# Patient Record
Sex: Female | Born: 1935 | Race: Black or African American | Hispanic: No | Marital: Single | Smoking: Current some day smoker
Health system: Southern US, Community
[De-identification: ages and names within clinical notes are randomized; demographics above are authoritative.]

## PROBLEM LIST (undated history)

## (undated) DIAGNOSIS — M1990 Unspecified osteoarthritis, unspecified site: Secondary | ICD-10-CM

## (undated) DIAGNOSIS — F17210 Nicotine dependence, cigarettes, uncomplicated: Secondary | ICD-10-CM

## (undated) DIAGNOSIS — C50919 Malignant neoplasm of unspecified site of unspecified female breast: Secondary | ICD-10-CM

## (undated) DIAGNOSIS — E7800 Pure hypercholesterolemia, unspecified: Secondary | ICD-10-CM

## (undated) DIAGNOSIS — I10 Essential (primary) hypertension: Secondary | ICD-10-CM

## (undated) DIAGNOSIS — E039 Hypothyroidism, unspecified: Secondary | ICD-10-CM

## (undated) HISTORY — DX: Pure hypercholesterolemia, unspecified: E78.00

## (undated) HISTORY — DX: Nicotine dependence, cigarettes, uncomplicated: F17.210

## (undated) HISTORY — DX: Malignant neoplasm of unspecified site of unspecified female breast: C50.919

## (undated) HISTORY — PX: ROTATOR CUFF REPAIR: SHX139

## (undated) HISTORY — DX: Essential (primary) hypertension: I10

## (undated) HISTORY — DX: Unspecified osteoarthritis, unspecified site: M19.90

## (undated) HISTORY — DX: Hypothyroidism, unspecified: E03.9

---

## 1970-10-26 HISTORY — PX: PARTIAL HYSTERECTOMY: SHX80

## 2000-09-13 ENCOUNTER — Encounter: Payer: Self-pay | Admitting: Internal Medicine

## 2000-09-13 ENCOUNTER — Encounter: Admission: RE | Admit: 2000-09-13 | Discharge: 2000-09-13 | Payer: Self-pay | Admitting: Internal Medicine

## 2007-10-27 HISTORY — PX: BREAST LUMPECTOMY: SHX2

## 2008-02-24 ENCOUNTER — Encounter: Admission: RE | Admit: 2008-02-24 | Discharge: 2008-02-24 | Payer: Self-pay | Admitting: Surgery

## 2008-02-28 ENCOUNTER — Ambulatory Visit (HOSPITAL_BASED_OUTPATIENT_CLINIC_OR_DEPARTMENT_OTHER): Admission: RE | Admit: 2008-02-28 | Discharge: 2008-02-28 | Payer: Self-pay | Admitting: Surgery

## 2008-02-28 ENCOUNTER — Encounter (INDEPENDENT_AMBULATORY_CARE_PROVIDER_SITE_OTHER): Payer: Self-pay | Admitting: Surgery

## 2008-03-07 ENCOUNTER — Ambulatory Visit: Payer: Self-pay | Admitting: Oncology

## 2008-03-14 ENCOUNTER — Ambulatory Visit: Admission: RE | Admit: 2008-03-14 | Discharge: 2008-05-25 | Payer: Self-pay | Admitting: Radiation Oncology

## 2008-05-02 ENCOUNTER — Ambulatory Visit: Payer: Self-pay | Admitting: Oncology

## 2008-05-07 LAB — CBC WITH DIFFERENTIAL/PLATELET
BASO%: 0.3 % (ref 0.0–2.0)
EOS%: 1.8 % (ref 0.0–7.0)
HGB: 14.9 g/dL (ref 11.6–15.9)
MCH: 31.6 pg (ref 26.0–34.0)
MCHC: 34.3 g/dL (ref 32.0–36.0)
RDW: 13.6 % (ref 11.3–14.5)
lymph#: 1.3 10*3/uL (ref 0.9–3.3)

## 2008-05-07 LAB — COMPREHENSIVE METABOLIC PANEL
ALT: 24 U/L (ref 0–35)
AST: 17 U/L (ref 0–37)
Albumin: 4.7 g/dL (ref 3.5–5.2)
Calcium: 10.6 mg/dL — ABNORMAL HIGH (ref 8.4–10.5)
Chloride: 102 mEq/L (ref 96–112)
Potassium: 3.7 mEq/L (ref 3.5–5.3)

## 2008-07-03 ENCOUNTER — Ambulatory Visit: Payer: Self-pay | Admitting: Oncology

## 2008-07-06 LAB — CBC WITH DIFFERENTIAL/PLATELET
Basophils Absolute: 0 10*3/uL (ref 0.0–0.1)
EOS%: 1.8 % (ref 0.0–7.0)
HCT: 44.1 % (ref 34.8–46.6)
HGB: 15.2 g/dL (ref 11.6–15.9)
LYMPH%: 27.3 % (ref 14.0–48.0)
MCH: 31.9 pg (ref 26.0–34.0)
MCV: 92.3 fL (ref 81.0–101.0)
MONO%: 7.5 % (ref 0.0–13.0)
NEUT%: 63.1 % (ref 39.6–76.8)

## 2008-07-06 LAB — COMPREHENSIVE METABOLIC PANEL
ALT: 20 U/L (ref 0–35)
AST: 17 U/L (ref 0–37)
Albumin: 4.8 g/dL (ref 3.5–5.2)
BUN: 11 mg/dL (ref 6–23)
CO2: 22 mEq/L (ref 19–32)
Calcium: 11.1 mg/dL — ABNORMAL HIGH (ref 8.4–10.5)
Chloride: 104 mEq/L (ref 96–112)
Potassium: 3.8 mEq/L (ref 3.5–5.3)

## 2008-09-05 ENCOUNTER — Ambulatory Visit: Payer: Self-pay | Admitting: Oncology

## 2008-09-07 LAB — COMPREHENSIVE METABOLIC PANEL
ALT: 28 U/L (ref 0–35)
AST: 21 U/L (ref 0–37)
Calcium: 10.5 mg/dL (ref 8.4–10.5)
Chloride: 107 mEq/L (ref 96–112)
Creatinine, Ser: 0.78 mg/dL (ref 0.40–1.20)
Sodium: 141 mEq/L (ref 135–145)
Total Bilirubin: 0.4 mg/dL (ref 0.3–1.2)
Total Protein: 7.8 g/dL (ref 6.0–8.3)

## 2008-09-07 LAB — CBC WITH DIFFERENTIAL/PLATELET
BASO%: 0.4 % (ref 0.0–2.0)
EOS%: 0.6 % (ref 0.0–7.0)
HCT: 42.9 % (ref 34.8–46.6)
MCH: 31.5 pg (ref 26.0–34.0)
MCHC: 33.8 g/dL (ref 32.0–36.0)
NEUT%: 65.5 % (ref 39.6–76.8)
RBC: 4.62 10*6/uL (ref 3.70–5.32)
lymph#: 2 10*3/uL (ref 0.9–3.3)

## 2008-12-05 ENCOUNTER — Ambulatory Visit: Payer: Self-pay | Admitting: Oncology

## 2008-12-21 ENCOUNTER — Encounter: Admission: RE | Admit: 2008-12-21 | Discharge: 2008-12-21 | Payer: Self-pay | Admitting: Oncology

## 2008-12-28 ENCOUNTER — Encounter: Admission: RE | Admit: 2008-12-28 | Discharge: 2008-12-28 | Payer: Self-pay | Admitting: Oncology

## 2008-12-28 ENCOUNTER — Encounter (INDEPENDENT_AMBULATORY_CARE_PROVIDER_SITE_OTHER): Payer: Self-pay | Admitting: Diagnostic Radiology

## 2009-02-06 ENCOUNTER — Encounter: Admission: RE | Admit: 2009-02-06 | Discharge: 2009-02-06 | Payer: Self-pay | Admitting: Orthopaedic Surgery

## 2009-03-06 ENCOUNTER — Ambulatory Visit: Payer: Self-pay | Admitting: Oncology

## 2009-03-08 LAB — COMPREHENSIVE METABOLIC PANEL
ALT: 29 U/L (ref 0–35)
AST: 22 U/L (ref 0–37)
Albumin: 4.3 g/dL (ref 3.5–5.2)
Calcium: 11 mg/dL — ABNORMAL HIGH (ref 8.4–10.5)
Chloride: 106 mEq/L (ref 96–112)
Potassium: 3.8 mEq/L (ref 3.5–5.3)
Sodium: 140 mEq/L (ref 135–145)
Total Protein: 7.5 g/dL (ref 6.0–8.3)

## 2009-03-08 LAB — CBC WITH DIFFERENTIAL/PLATELET
BASO%: 0.4 % (ref 0.0–2.0)
Eosinophils Absolute: 0 10*3/uL (ref 0.0–0.5)
MCHC: 34.8 g/dL (ref 31.5–36.0)
MONO#: 0.4 10*3/uL (ref 0.1–0.9)
NEUT#: 2.3 10*3/uL (ref 1.5–6.5)
RBC: 4.83 10*6/uL (ref 3.70–5.45)
WBC: 4.6 10*3/uL (ref 3.9–10.3)
lymph#: 1.9 10*3/uL (ref 0.9–3.3)
nRBC: 0 % (ref 0–0)

## 2009-03-08 LAB — LACTATE DEHYDROGENASE: LDH: 177 U/L (ref 94–250)

## 2009-05-21 ENCOUNTER — Inpatient Hospital Stay (HOSPITAL_COMMUNITY): Admission: RE | Admit: 2009-05-21 | Discharge: 2009-05-24 | Payer: Self-pay | Admitting: Orthopaedic Surgery

## 2009-07-23 ENCOUNTER — Ambulatory Visit: Payer: Self-pay | Admitting: Oncology

## 2009-07-25 LAB — CBC WITH DIFFERENTIAL/PLATELET
Basophils Absolute: 0 10*3/uL (ref 0.0–0.1)
Eosinophils Absolute: 0 10*3/uL (ref 0.0–0.5)
HCT: 40.9 % (ref 34.8–46.6)
HGB: 14.1 g/dL (ref 11.6–15.9)
LYMPH%: 31.4 % (ref 14.0–49.7)
MCV: 90.7 fL (ref 79.5–101.0)
MONO#: 0.5 10*3/uL (ref 0.1–0.9)
MONO%: 6 % (ref 0.0–14.0)
NEUT#: 4.7 10*3/uL (ref 1.5–6.5)
Platelets: 196 10*3/uL (ref 145–400)
RBC: 4.51 10*6/uL (ref 3.70–5.45)
WBC: 7.6 10*3/uL (ref 3.9–10.3)

## 2009-07-25 LAB — COMPREHENSIVE METABOLIC PANEL
Albumin: 4.3 g/dL (ref 3.5–5.2)
BUN: 14 mg/dL (ref 6–23)
CO2: 26 mEq/L (ref 19–32)
Glucose, Bld: 103 mg/dL — ABNORMAL HIGH (ref 70–99)
Potassium: 3.2 mEq/L — ABNORMAL LOW (ref 3.5–5.3)
Sodium: 139 mEq/L (ref 135–145)
Total Bilirubin: 0.5 mg/dL (ref 0.3–1.2)
Total Protein: 7.4 g/dL (ref 6.0–8.3)

## 2009-07-25 LAB — LACTATE DEHYDROGENASE: LDH: 140 U/L (ref 94–250)

## 2009-10-26 HISTORY — PX: OTHER SURGICAL HISTORY: SHX169

## 2009-11-19 ENCOUNTER — Ambulatory Visit: Payer: Self-pay | Admitting: Oncology

## 2009-11-22 LAB — CBC WITH DIFFERENTIAL/PLATELET
BASO%: 0.3 % (ref 0.0–2.0)
EOS%: 0.6 % (ref 0.0–7.0)
HCT: 42.2 % (ref 34.8–46.6)
LYMPH%: 25.9 % (ref 14.0–49.7)
MCH: 32.6 pg (ref 25.1–34.0)
MCHC: 33.8 g/dL (ref 31.5–36.0)
MONO#: 0.4 10*3/uL (ref 0.1–0.9)
NEUT%: 67.4 % (ref 38.4–76.8)
RBC: 4.38 10*6/uL (ref 3.70–5.45)
WBC: 7.2 10*3/uL (ref 3.9–10.3)
lymph#: 1.9 10*3/uL (ref 0.9–3.3)

## 2009-11-22 LAB — COMPREHENSIVE METABOLIC PANEL
Albumin: 4.6 g/dL (ref 3.5–5.2)
Alkaline Phosphatase: 69 U/L (ref 39–117)
BUN: 16 mg/dL (ref 6–23)
CO2: 23 mEq/L (ref 19–32)
Calcium: 10.5 mg/dL (ref 8.4–10.5)
Chloride: 107 mEq/L (ref 96–112)
Glucose, Bld: 92 mg/dL (ref 70–99)
Sodium: 141 mEq/L (ref 135–145)

## 2009-11-22 LAB — LACTATE DEHYDROGENASE: LDH: 184 U/L (ref 94–250)

## 2010-03-28 ENCOUNTER — Encounter: Admission: RE | Admit: 2010-03-28 | Discharge: 2010-03-28 | Payer: Self-pay | Admitting: Gynecology

## 2010-04-01 ENCOUNTER — Ambulatory Visit (HOSPITAL_BASED_OUTPATIENT_CLINIC_OR_DEPARTMENT_OTHER): Admission: RE | Admit: 2010-04-01 | Discharge: 2010-04-02 | Payer: Self-pay | Admitting: Gynecology

## 2010-04-13 ENCOUNTER — Emergency Department (HOSPITAL_COMMUNITY): Admission: EM | Admit: 2010-04-13 | Discharge: 2010-04-13 | Payer: Self-pay | Admitting: Emergency Medicine

## 2010-04-15 ENCOUNTER — Emergency Department (HOSPITAL_COMMUNITY): Admission: EM | Admit: 2010-04-15 | Discharge: 2010-04-15 | Payer: Self-pay | Admitting: Emergency Medicine

## 2010-05-20 ENCOUNTER — Ambulatory Visit: Payer: Self-pay | Admitting: Oncology

## 2010-05-23 LAB — COMPREHENSIVE METABOLIC PANEL
ALT: 17 U/L (ref 0–35)
AST: 17 U/L (ref 0–37)
CO2: 23 mEq/L (ref 19–32)
Calcium: 10.3 mg/dL (ref 8.4–10.5)
Chloride: 106 mEq/L (ref 96–112)
Potassium: 4 mEq/L (ref 3.5–5.3)
Sodium: 140 mEq/L (ref 135–145)
Total Protein: 7.5 g/dL (ref 6.0–8.3)

## 2010-05-23 LAB — CBC WITH DIFFERENTIAL/PLATELET
BASO%: 0.6 % (ref 0.0–2.0)
Eosinophils Absolute: 0 10*3/uL (ref 0.0–0.5)
MONO#: 0.5 10*3/uL (ref 0.1–0.9)
NEUT#: 2.9 10*3/uL (ref 1.5–6.5)
Platelets: 225 10*3/uL (ref 145–400)
RBC: 4.52 10*6/uL (ref 3.70–5.45)
RDW: 13.1 % (ref 11.2–14.5)
WBC: 5.7 10*3/uL (ref 3.9–10.3)
lymph#: 2.2 10*3/uL (ref 0.9–3.3)

## 2010-05-23 LAB — LACTATE DEHYDROGENASE: LDH: 162 U/L (ref 94–250)

## 2010-11-19 ENCOUNTER — Ambulatory Visit: Payer: Self-pay | Admitting: Oncology

## 2010-11-21 LAB — COMPREHENSIVE METABOLIC PANEL
ALT: 32 U/L (ref 0–35)
Albumin: 4.3 g/dL (ref 3.5–5.2)
CO2: 25 mEq/L (ref 19–32)
Calcium: 10.2 mg/dL (ref 8.4–10.5)
Chloride: 108 mEq/L (ref 96–112)
Glucose, Bld: 94 mg/dL (ref 70–99)
Potassium: 3.5 mEq/L (ref 3.5–5.3)
Sodium: 140 mEq/L (ref 135–145)
Total Bilirubin: 0.5 mg/dL (ref 0.3–1.2)
Total Protein: 7.5 g/dL (ref 6.0–8.3)

## 2010-11-21 LAB — CBC WITH DIFFERENTIAL/PLATELET
BASO%: 0.3 % (ref 0.0–2.0)
Eosinophils Absolute: 0.1 10*3/uL (ref 0.0–0.5)
LYMPH%: 45.1 % (ref 14.0–49.7)
MCHC: 33.9 g/dL (ref 31.5–36.0)
MONO#: 0.5 10*3/uL (ref 0.1–0.9)
NEUT#: 3.3 10*3/uL (ref 1.5–6.5)
Platelets: 185 10*3/uL (ref 145–400)
RBC: 4.8 10*6/uL (ref 3.70–5.45)
RDW: 13.1 % (ref 11.2–14.5)
WBC: 7 10*3/uL (ref 3.9–10.3)
lymph#: 3.2 10*3/uL (ref 0.9–3.3)

## 2010-11-21 LAB — LACTATE DEHYDROGENASE: LDH: 150 U/L (ref 94–250)

## 2011-01-11 LAB — DIFFERENTIAL
Basophils Absolute: 0 10*3/uL (ref 0.0–0.1)
Basophils Relative: 0 % (ref 0–1)
Eosinophils Absolute: 0.1 10*3/uL (ref 0.0–0.7)
Eosinophils Relative: 0 % (ref 0–5)
Monocytes Absolute: 0.7 10*3/uL (ref 0.1–1.0)
Monocytes Relative: 5 % (ref 3–12)
Neutro Abs: 11.3 10*3/uL — ABNORMAL HIGH (ref 1.7–7.7)

## 2011-01-11 LAB — BASIC METABOLIC PANEL
CO2: 27 mEq/L (ref 19–32)
Calcium: 10.2 mg/dL (ref 8.4–10.5)
Chloride: 101 mEq/L (ref 96–112)
Creatinine, Ser: 0.73 mg/dL (ref 0.4–1.2)
GFR calc Af Amer: >60 mL/min (ref >60)
Glucose, Bld: 126 mg/dL — ABNORMAL HIGH (ref 70–99)
Sodium: 136 mEq/L (ref 135–145)

## 2011-01-11 LAB — URINALYSIS, ROUTINE W REFLEX MICROSCOPIC
Bilirubin Urine: NEGATIVE
Bilirubin Urine: NEGATIVE
Hgb urine dipstick: NEGATIVE
Ketones, ur: NEGATIVE mg/dL
Ketones, ur: NEGATIVE mg/dL
Leukocytes, UA: NEGATIVE
Nitrite: NEGATIVE
Nitrite: POSITIVE — AB
Protein, ur: NEGATIVE mg/dL
Protein, ur: NEGATIVE mg/dL
Specific Gravity, Urine: 1.009 (ref 1.005–1.030)
Urobilinogen, UA: 0.2 mg/dL (ref 0.0–1.0)

## 2011-01-11 LAB — CBC
Hemoglobin: 12.2 g/dL (ref 12.0–15.0)
MCHC: 33.2 g/dL (ref 30.0–36.0)
MCV: 95 fL (ref 78.0–100.0)
RBC: 3.88 MIL/uL (ref 3.87–5.11)
RDW: 13.2 % (ref 11.5–15.5)

## 2011-01-12 LAB — POCT I-STAT, CHEM 8
Calcium, Ion: 1.34 mmol/L — ABNORMAL HIGH (ref 1.12–1.32)
Creatinine, Ser: 0.7 mg/dL (ref 0.4–1.2)
Glucose, Bld: 108 mg/dL — ABNORMAL HIGH (ref 70–99)
HCT: 45 % (ref 36.0–46.0)
Hemoglobin: 15.3 g/dL — ABNORMAL HIGH (ref 12.0–15.0)
Potassium: 3.8 mEq/L (ref 3.5–5.1)
TCO2: 27 mmol/L (ref 0–100)

## 2011-01-23 ENCOUNTER — Encounter: Payer: Self-pay | Admitting: Pulmonary Disease

## 2011-01-28 ENCOUNTER — Encounter: Payer: Self-pay | Admitting: Pulmonary Disease

## 2011-01-28 ENCOUNTER — Other Ambulatory Visit (INDEPENDENT_AMBULATORY_CARE_PROVIDER_SITE_OTHER): Payer: Medicare Other

## 2011-01-28 ENCOUNTER — Ambulatory Visit (INDEPENDENT_AMBULATORY_CARE_PROVIDER_SITE_OTHER): Payer: Medicare Other | Admitting: Pulmonary Disease

## 2011-01-28 DIAGNOSIS — K5900 Constipation, unspecified: Secondary | ICD-10-CM

## 2011-01-28 DIAGNOSIS — C50919 Malignant neoplasm of unspecified site of unspecified female breast: Secondary | ICD-10-CM

## 2011-01-28 DIAGNOSIS — I10 Essential (primary) hypertension: Secondary | ICD-10-CM | POA: Insufficient documentation

## 2011-01-28 DIAGNOSIS — E7800 Pure hypercholesterolemia, unspecified: Secondary | ICD-10-CM

## 2011-01-28 DIAGNOSIS — E039 Hypothyroidism, unspecified: Secondary | ICD-10-CM | POA: Insufficient documentation

## 2011-01-28 DIAGNOSIS — F17210 Nicotine dependence, cigarettes, uncomplicated: Secondary | ICD-10-CM | POA: Insufficient documentation

## 2011-01-28 DIAGNOSIS — F17200 Nicotine dependence, unspecified, uncomplicated: Secondary | ICD-10-CM

## 2011-01-28 LAB — CBC WITH DIFFERENTIAL/PLATELET
Eosinophils Absolute: 0.1 10*3/uL (ref 0.0–0.7)
Eosinophils Relative: 0.8 % (ref 0.0–5.0)
Lymphocytes Relative: 44.8 % (ref 12.0–46.0)
MCV: 91.1 fl (ref 78.0–100.0)
Monocytes Absolute: 0.6 10*3/uL (ref 0.1–1.0)
Neutrophils Relative %: 47 % (ref 43.0–77.0)
Platelets: 188 10*3/uL (ref 150.0–400.0)
RBC: 4.63 Mil/uL (ref 3.87–5.11)
WBC: 7.9 10*3/uL (ref 4.5–10.5)

## 2011-01-28 LAB — BASIC METABOLIC PANEL
BUN: 12 mg/dL (ref 6–23)
Calcium: 10.5 mg/dL (ref 8.4–10.5)
Chloride: 109 mEq/L (ref 96–112)
Creatinine, Ser: 0.7 mg/dL (ref 0.4–1.2)

## 2011-01-28 LAB — TSH: TSH: 0.09 u[IU]/mL — ABNORMAL LOW (ref 0.35–5.50)

## 2011-01-28 MED ORDER — TRIAMTERENE-HCTZ 37.5-25 MG PO TABS: 1.0000 | ORAL_TABLET | Freq: Every day | ORAL | Status: DC

## 2011-01-28 MED ORDER — ROSUVASTATIN CALCIUM 10 MG PO TABS: 10.0000 mg | ORAL_TABLET | Freq: Every day | ORAL | Status: DC

## 2011-01-28 MED ORDER — POTASSIUM CHLORIDE 10 MEQ PO TBCR: 10.0000 meq | EXTENDED_RELEASE_TABLET | Freq: Every day | ORAL | Status: DC

## 2011-01-28 MED ORDER — AMLODIPINE BESYLATE 10 MG PO TABS: 10.0000 mg | ORAL_TABLET | Freq: Every day | ORAL | Status: DC

## 2011-01-28 MED ORDER — LEVOTHYROXINE SODIUM 75 MCG PO TABS: 75.0000 ug | ORAL_TABLET | Freq: Every day | ORAL | Status: DC

## 2011-01-28 NOTE — Patient Instructions (Signed)
Jackie Horton, it was nice meeting you today...    We established a med list (see below) & refilled your current meds to continue the same for now...  Today we did a baseline CXR & some non-fasting blood work to include your thyroid function...    Please call the PHONE TREE in a few days for your results...    Dial [id number] & when prompted enter your pt number followed by the # symbol...    You pt number =  [phone]  Please collect the "stool cards" and mail them back to [place] at your earliest convenience so we can check for hidden blood... Call for any questions... Let's plan a follow up visit in 3-4 months to check your progress and answer any questions that might arise.Marland KitchenMarland Kitchen

## 2011-02-01 ENCOUNTER — Encounter: Payer: Self-pay | Admitting: Pulmonary Disease

## 2011-02-01 DIAGNOSIS — K5900 Constipation, unspecified: Secondary | ICD-10-CM | POA: Insufficient documentation

## 2011-02-01 LAB — BASIC METABOLIC PANEL
BUN: 10 mg/dL (ref 6–23)
BUN: 9 mg/dL (ref 6–23)
CO2: 24 mEq/L (ref 19–32)
CO2: 28 mEq/L (ref 19–32)
Calcium: 8.8 mg/dL (ref 8.4–10.5)
Calcium: 9.5 mg/dL (ref 8.4–10.5)
Calcium: 9.5 mg/dL (ref 8.4–10.5)
Chloride: 105 mEq/L (ref 96–112)
GFR calc Af Amer: >60 mL/min (ref >60)
GFR calc non Af Amer: >60 mL/min (ref >60)
Glucose, Bld: 100 mg/dL — ABNORMAL HIGH (ref 70–99)
Glucose, Bld: 107 mg/dL — ABNORMAL HIGH (ref 70–99)
Glucose, Bld: 125 mg/dL — ABNORMAL HIGH (ref 70–99)
Potassium: 4.2 mEq/L (ref 3.5–5.1)
Sodium: 134 mEq/L — ABNORMAL LOW (ref 135–145)
Sodium: 135 mEq/L (ref 135–145)
Sodium: 136 mEq/L (ref 135–145)

## 2011-02-01 LAB — CBC
HCT: 34.6 % — ABNORMAL LOW (ref 36.0–46.0)
HCT: 36.8 % (ref 36.0–46.0)
HCT: 44.9 % (ref 36.0–46.0)
Hemoglobin: 12 g/dL (ref 12.0–15.0)
Hemoglobin: 12.3 g/dL (ref 12.0–15.0)
Hemoglobin: 15.4 g/dL — ABNORMAL HIGH (ref 12.0–15.0)
MCHC: 33.5 g/dL (ref 30.0–36.0)
MCHC: 34.7 g/dL (ref 30.0–36.0)
Platelets: 140 10*3/uL — ABNORMAL LOW (ref 150–400)
Platelets: 151 10*3/uL (ref 150–400)
RBC: 3.75 MIL/uL — ABNORMAL LOW (ref 3.87–5.11)
RDW: 12.9 % (ref 11.5–15.5)
RDW: 13.1 % (ref 11.5–15.5)
RDW: 13.1 % (ref 11.5–15.5)
WBC: 3.6 10*3/uL — ABNORMAL LOW (ref 4.0–10.5)
WBC: 5.1 10*3/uL (ref 4.0–10.5)

## 2011-02-01 LAB — URINALYSIS, ROUTINE W REFLEX MICROSCOPIC
Bilirubin Urine: NEGATIVE
Glucose, UA: NEGATIVE mg/dL
Hgb urine dipstick: NEGATIVE
Specific Gravity, Urine: 1.007 (ref 1.005–1.030)
Urobilinogen, UA: 0.2 mg/dL (ref 0.0–1.0)
pH: 6.5 (ref 5.0–8.0)

## 2011-02-01 LAB — COMPREHENSIVE METABOLIC PANEL
ALT: 28 U/L (ref 0–35)
Albumin: 4.5 g/dL (ref 3.5–5.2)
Alkaline Phosphatase: 64 U/L (ref 39–117)
BUN: 11 mg/dL (ref 6–23)
Chloride: 99 mEq/L (ref 96–112)
Glucose, Bld: 91 mg/dL (ref 70–99)
Potassium: 3.5 mEq/L (ref 3.5–5.1)
Sodium: 133 mEq/L — ABNORMAL LOW (ref 135–145)
Total Bilirubin: 0.5 mg/dL (ref 0.3–1.2)
Total Protein: 7.7 g/dL (ref 6.0–8.3)

## 2011-02-01 LAB — CROSSMATCH
ABO/RH(D): B POS
Antibody Screen: NEGATIVE

## 2011-02-01 LAB — DIFFERENTIAL
Basophils Absolute: 0 10*3/uL (ref 0.0–0.1)
Basophils Relative: 0 % (ref 0–1)
Eosinophils Absolute: 0.1 10*3/uL (ref 0.0–0.7)
Monocytes Absolute: 0.4 10*3/uL (ref 0.1–1.0)
Monocytes Relative: 8 % (ref 3–12)
Neutro Abs: 2.5 10*3/uL (ref 1.7–7.7)
Neutrophils Relative %: 49 % (ref 43–77)

## 2011-02-01 LAB — PROTIME-INR: Prothrombin Time: 13.2 seconds (ref 11.6–15.2)

## 2011-02-01 LAB — TSH: TSH: 6.515 u[IU]/mL — ABNORMAL HIGH (ref 0.350–4.500)

## 2011-02-01 NOTE — Assessment & Plan Note (Signed)
She states she's been stable on the Crestor 10mg /d;  Not fasting today to recheck labs but we will get records and check FLP on ret.Marland KitchenMarland Kitchen

## 2011-02-01 NOTE — Progress Notes (Signed)
Subjective:    Patient ID: Jackie Horton, female    DOB: 07-24-1933, 77 y.o.   MRN: [id number]  HPI 77 y/o BF here to establish as a new patient> referred by her boyfriend [name], & her neighbor [name]; she has been a long time pt of DrGegick who is now retiring & we have requested his records to review;  Current problem list compiled by her memory, her medication list, & hosp EChart records;  She still works as a Actuary at [employer]...    Cig smoker>  She smokes 5-10 cig/d now;  96 yr hx smoking all <1ppd;  Denies hx lung dis but notes some AM cough/ sputum & SOB/ DOE    HBP>  On Amlodipine 10mg /d, Maxzide 1/2 tab daily, & KCl 27mEq daily;  BP= 122/74, tolerating meds well, not checking BP at home, and denies CP/ palpit/ dizzy/ SOB/ edema/ etc...    Chol>  On Crestor 10mg  daily w/ good control of lipids she thinks, but we don't have labs to review yet & not fasting today;  We discussed getting old records & checking FLP on ret OV...    Hypothy>  Apparently w/ long hx hypothyroidism on replacement Rx w/ Synthroid 37mcg/d;  Labs today revealed TSH= 0.09 over suppressed & we will need to decr her dose slightly...    Constipation>  She tells me she goes only once per week & then only because she takes MOM;  She has NEVER HAD A COLONOSCOPY & is reluctant to consider this now;  We discussed Rx w/ Miralax & Senakot-S regularly, and referral to GI for constip eval & to consider the needed colonoscopy...    Urinary retention>  She had episode of U retention 6/11 several weeks after her AP repair surg, & eval in ER (EChart review)...    GYN>  S/p hysterectomy 19yrs ago & A/P repair for pelvic organ prolapse 6/11 by DrLomax (symptoms improved post surg)...    Breast Cancer>  S/P surg for left breast cancer, DCIS 5/09 by DrStreck;  She had axillary lymph node bx 3/10- neg, & needle bx left breat 4/11 that was neg (fibrosis);  Oncology eval & f/u by DrMurinson on Tamoxifen 20mg /d & he sees her  Q48mo;  Last mammogram ~58mo ago at [facility] was neg...    DJD>  She has mod DJD esp in L/S spine w/ scoliosis on XRays;  She had right shoulder hemiarthroplasty 7/10 by DrWhitfield (right humeral head prosthesis seen on CXR now) & she has limited shoulder motion on exam...    Osteop>  ? If she's had prev BMD at [facility], we will have to wait on old records to review;  She takes MVI, VitC, & VitD 1000u daily...     Health Maintenance>  She says that she gets the yearly Flu vaccine, but has never had the Pneumonia shot, & she thinks that maybe she had a tetanus shot several yrs ago;  GYN= DrLomax;  GI= we will refer to [place] for eval constip & colonoscopy...     We did labs 01/28/11 w/ BMet- neg, CBC- neg, TSH- 0.09 over suppressed on the 38mcg dose (I will check w/ her pharm for med hx & adjust as appropriate...   Past Medical History  Diagnosis Date  . Breast cancer   . Hypertension   . Hypercholesteremia   . Hypothyroid   . Cigarette smoker   . Osteoarthritis   . Constipation     Past Surgical History  Procedure Date  . Partial hysterectomy 1972  . Breast lumpectomy 2009  . Rotator cuff repair   . Bladder tact 2011    Outpatient Encounter Prescriptions as of 01/28/2011  Medication Sig Dispense Refill  . amLODipine (NORVASC) 10 MG tablet Take 1 tablet (10 mg total) by mouth daily.  90 tablet  3  . levothyroxine (SYNTHROID, LEVOTHROID) 75 MCG tablet Take 1 tablet (75 mcg total) by mouth daily.  90 tablet  3  . polyethylene glycol powder (GLYCOLAX/MIRALAX) powder Take 17 g by mouth daily. 1 capful in water daily       . potassium chloride (KLOR-CON) 10 MEQ CR tablet Take 1 tablet (10 mEq total) by mouth daily.  90 tablet  3  . rosuvastatin (CRESTOR) 10 MG tablet Take 1 tablet (10 mg total) by mouth daily.  90 tablet  3  . senna-docusate (SENOKOT S) 8.6-50 MG per tablet Take 2 tablets by mouth at bedtime.        . tamoxifen (NOLVADEX) 20 MG tablet Take 20 mg by mouth daily.          Marland Kitchen triamterene-hydrochlorothiazide (MAXZIDE-25) 37.5-25 MG per tablet Take 1 tablet by mouth daily. Take 1/2 tablet by mouth daily  90 tablet  3                                 No Known Allergies   History   Social History  . Marital Status: Divorced    Spouse Name: N/A    Number of Children: 3  . Years of Education: N/A   Occupational History  . caregiver     works 3 days week at [employer]  . Smoking status: Current Some Day Smoker -- 0.3 packs/day    Types: Cigarettes  . Smokeless tobacco: Never Used  . Alcohol Use: No  . Drug Use: No  . Sexually Active: Not on file   Other Topics Concern  . Not on file   Social History Narrative   2 sisters with breast cancer   Family History  Problem Relation Age of Onset  . Breast cancer Daughter   . Diabetes Daughter      Review of Systems    Constitutional:  Denies F/C/S, anorexia, unexpected weight change. HEENT:  No HA, visual changes, earache, nasal symptoms, sore throat, hoarseness. Resp:  min cough & am sputum; no hemoptysis; no SOB, tightness, wheezing. Cardio:  No CP, palpit, DOE, orthopnea, edema. GI:  Denies N/V/D/C or blood in stool; no reflux, abd pain, distention, or gas. GU:  No dysuria, freq, urgency, hematuria, or flank pain. MS:  + joint pain, no swelling or tenderness; decr ROM left shoulder; no neck pain, back pain, etc. Neuro:  No tremors, seizures, dizziness, syncope, weakness, numbness, gait abn. Skin:  No suspicious lesions or skin rash. Heme:  No adenopathy, bruising, bleeding. Psyche: Denies confusion, sleep disturbance, hallucinations, anxiety, depression.   Objective:   Physical Exam    WD, WN, 77 y/o BF in NAD... Vital Signs:  Reviewed...  General:  Alert & oriented; pleasant & cooperative... HEENT:  [place]/AT, EOM-wnl, PERRLA, EACs-clear, TMs-wnl, NOSE-clear, THROAT-clear & wnl. Neck:  Supple w/ fairROM; no JVD; normal carotid impulses w/o bruits; no  thyromegaly or nodules palpated; no lymphadenopathy. Chest:  Clear to P & A; without wheezes/ rales/ or rhonchi heard... Heart:  Regular Rhythm; norm S1 & S2 without murmurs/ rubs/ or  gallops detected... Abdomen:  Soft & nontender; normal bowel sounds; no organomegaly or masses palpated... Ext:  Normal ROM; without deformities or arthritic changes; no varicose veins, venous insuffic, or edema;  Pulses intact w/o bruits... Neuro:  CNs II-XII intact; motor testing normal; sensory testing normal; gait normal & balance OK... Derm:  No lesions noted; no rash etc... Lymph:  No cervical, supraclavicular, axillary, or inguinal adenopathy palpated...    Assessment & Plan:

## 2011-02-01 NOTE — Assessment & Plan Note (Signed)
BP appears well controlled on her Amlodipine + Maxzide;  Continue same.Marland KitchenMarland Kitchen

## 2011-02-01 NOTE — Assessment & Plan Note (Signed)
She is followed by DrMurinson on Tamoxifen Rx;  Stable, no known recurrence.Marland KitchenMarland Kitchen

## 2011-02-01 NOTE — Assessment & Plan Note (Signed)
She has rather remarkable constipation & goes only once per week & requires MOM at that time too;  More importantly- she has never had a GI work up or a screening colonoscopy;  We discussed Rx w/ Miralax once or twice daily & Senakot-S 1-2 Qhs;  She may need Amitiza & needs GI eval> we will refer to GI for colonoscopy.Marland KitchenMarland Kitchen

## 2011-02-01 NOTE — Assessment & Plan Note (Signed)
She has been followed & managed by [facility] for yrs;  He is retiring & we will send for records;  TSH today on 41mcg/d - 0.09 & we will investigate her med hx w/ her pharm & decr the dose as appropriate.Marland KitchenMarland Kitchen

## 2011-02-01 NOTE — Assessment & Plan Note (Signed)
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

## 2011-02-05 ENCOUNTER — Telehealth: Payer: Self-pay | Admitting: Pulmonary Disease

## 2011-02-05 NOTE — Telephone Encounter (Signed)
Forwarded to Dr. Lenna Gilford for review.

## 2011-02-09 ENCOUNTER — Other Ambulatory Visit: Payer: Self-pay | Admitting: Pulmonary Disease

## 2011-02-09 ENCOUNTER — Other Ambulatory Visit: Payer: Medicare Other

## 2011-02-09 DIAGNOSIS — Z1289 Encounter for screening for malignant neoplasm of other sites: Secondary | ICD-10-CM

## 2011-02-09 LAB — HEMOCCULT SLIDES (X 3 CARDS)
OCCULT 2: NEGATIVE
OCCULT 3: NEGATIVE

## 2011-02-25 ENCOUNTER — Telehealth: Payer: Self-pay | Admitting: Pulmonary Disease

## 2011-02-25 NOTE — Telephone Encounter (Signed)
Forwarded to Dr. Lenna Gilford for review.

## 2011-03-10 NOTE — Op Note (Signed)
Jackie Horton, Jackie Horton             ACCOUNT NO.:  [id number]   MEDICAL RECORD NO.:  [id number]          PATIENT TYPE:  INP   LOCATION:  5008                         FACILITY:  [facility]   PHYSICIAN:  Vonna Kotyk. Whitfield, M.D.DATE OF BIRTH:  1933-05-09   DATE OF PROCEDURE:  05/21/2009  DATE OF DISCHARGE:                               OPERATIVE REPORT   PREOPERATIVE DIAGNOSIS:  Rotator cuff arthropathy, right shoulder.   POSTOPERATIVE DIAGNOSIS:  Rotator cuff arthropathy, right shoulder.   PROCEDURE:  CTA hemiarthroplasty, right shoulder.   SURGEON:  Vonna Kotyk. Durward Fortes, MD   ASSISTANT:  Geroge Baseman. Alphonzo Cruise, MD   ANESTHESIA:  General.   COMPLICATIONS:  None.   COMPONENTS:  Global advantage 10-mm humeral standard stem, a CTA 44 mm x  23 mm neck humeral head.  The stem was Press-Fit.   PROCEDURE:  With the patient comfortable on the operating table and  under general orotracheal anesthesia, the patient was placed in the semi-  sitting position with the shoulder frame.  The right shoulder was then  prepped with DuraPrep from the base of the neck circumferentially below  the elbow.  Sterile draping was performed.   Preoperatively, the patient was able to raise her arm over her head and  she did not have an internal rotation contracture.  I was able to  externally rotate about 40 degrees.   A deltopectoral incision was utilized in the right shoulder.  This was  outlined with a marking pencil.  I then incised with a 15 knife.  By a  sharp dissection, incision was carried down to the subcutaneous tissue.  Then by a blunt dissection, the adipose tissue was elevated.  The  deltopectoral groove was identified.  Cephalic vein was also identified  and carefully retracted laterally little if any bleeding from the vein.  The deltopectoral groove was then further identified and bluntly  dissected.  The clavipectoral fascia was identified and incised with the  needle tip Bovie.  Self-retaining  retractor was inserted.  The conjoined  tendon was also identified.  I could palpate the musculocutaneous nerve  and carefully retracted the conjoined joint tendon out of the operative  field.   The subscapularis tendon was identified.  It was intact.  I incised it  from its attachment about 1 cm medial to the lesser tuberosity and  tagged it superiorly and inferiorly and allowed it to retract.  I  excised the capsule from beneath the tendon any inflammatory tissue.  There was joint effusion.   I could then easily deliver the head superiorly into the wound.  There  were large areas of complete articular cartilage loss.  Clinically,  there was slight superior migration, but the glenoid appeared to be  perfectly intact without any erosion or deformity.   The biceps tendon was also identified.  There was considerable  inflammatory tissue within its sheath and there was some longitudinal  tearing.  I elected to perform a biceps tenodesis.  I incised the tendon  from its attachment to the superior glenoid and tagged it.   A drill hole was then made through  the center of the head near the  sulcus and then I hand-reamed to 10 mm at which point I got endosteal  purchase.  I then inserted the humeral head cutting device and with that  lined up I excised the humeral head.  At that point, I sequentially  rasped the humeral head and canal to 10 mm and then applied a 44-mm  head.  As I excised it, we had measured from the excised humeral head.  The shoulder was then reduced, though we had excellent position with the  retroversion.  I made a 23-mm neck and I was able to sublux the shoulder  approximately 50% with minimal inferior subluxation.  We thought there  was perfect tension.   The regular humeral head was then removed.  We applied the CTA cutting  jig to the rasp.  The patient did have tearing of the infra and  supraspinatus with retraction and the superior migration of the head.   The  cutting jig was used to remove a portion of the head to allow  insertion of the CTA head and then we fine-tuned it with a small  rongeur.  Cutting jig was removed.  The 44-mm outer diameter CTA head  with a 23-mm neck was then inserted.  It fit very nicely and then we  reduced the entire joint again and had perfect motion.   The trial components were then removed.  We copiously irrigated the  joint with saline solution and inspected the joint for any loose  material.  The final 10-mm humeral stem was then impacted flush on the  humeral head.  We again cleaned the reverse Morse taper insertion site,  dried it, and then applied the final 44-mm outer diameter CTA head with  a 23-mm neck length and then impacted it with the impactor.  It fit  nicely.  It was not loose.  I again inspected the glenoid.  The entire  construct was then reduced through a full range of motion.  We thought  it was not too tight and no evidence of impingement.   The biceps tendon was then tenodesed in its groove with 3-0 Ethibond  suture under what we felt was normal tension.  The wound was again  irrigated with saline solution.  The subscapularis was closed  anatomically with #1 Ethibond.  The deltopectoral groove was irrigated  and then closed with a running 0 Vicryl.  Subcu was closed with 2-0  Vicryl.  Skin was closed with Steri-Strips and with benzoin.  Sterile  bulky dressing was applied followed by a sling.  The patient was awoken  and returned to the Postanesthesia Recovery Room in satisfactory  condition without complications.      Vonna Kotyk. Durward Fortes, M.D.  Electronically Signed     PWW/MEDQ  D:  05/21/2009  T:  05/21/2009  Job:  [id number]

## 2011-03-10 NOTE — Op Note (Signed)
NAMESHAKILYA, Jackie Horton             ACCOUNT NO.:  [id number]   MEDICAL RECORD NO.:  [id number]          PATIENT TYPE:  AMB   LOCATION:  [place]                          FACILITY:  [facility]   PHYSICIAN:  [name], M.D.DATE OF BIRTH:  Mar 15, 1933   DATE OF PROCEDURE:  02/28/2008  DATE OF DISCHARGE:                               OPERATIVE REPORT   PREOPERATIVE DIAGNOSIS:  Left breast mass.   POSTOPERATIVE DIAGNOSIS:  Left breast mass.   OPERATION:  1. Needle-guided left breast biopsy.  2. Removal of breast mass.   SURGEON:  [name], MD   ANESTHESIA:  MAC.   CLINICAL HISTORY:  This 39-year lady has been found to have a small  irregular mass in the upper outer quadrant of the left breast.  This is  somewhat worrisome on mammogram.  A core biopsy showed only fibrosis,  and it was concerning that there was still tumor, and some adjacent  fibrosis had been biopsied.  It was therefore decided to do an  excisional biopsy of this area.  A clip was then placed at the time of  the original biopsy.   DESCRIPTION OF PROCEDURE:  The patient was seen in the holding area, and  she had no further questions.  We confirmed the left breast as the  operative site and I initialed that.  I reviewed the needle localized  films showing good positioning of the guidewire adjacent to some clips  and some small calcifications.   The patient was taken to the operating room, given IV sedation.  The  left breast was prepped and draped.  The time-out was done.   The guidewire entered somewhat laterally in the upper outer quadrant of  left breast and seemed to track towards the chest wall somewhat  superiorly and medially.  I made a curvilinear incision directly over  what I perceived to be the guidewire tract.  I divided about a  centimeter to a centimeter and a half of the subcutaneous fatty tissue.  I then grasped the tract of the guidewire with some Allis clamps and  used that for traction  and a cautery to take an excisional biopsy around  the guidewire.  As I got further in medially and deep, I was able to  reposition the Allis and finally was able to divide the breast tissue  beyond the tip of the guidewire.  We did a Faxitron specimen mammogram  in the operating room showing the guidewire and clip, and they appeared  to be towards the medial deep end of the specimen.  The specimen was  subsequently sent over to Dr. Gareth Eagle office for a formal specimen  mammogram.  Meanwhile, I went ahead and re-excised the medial margin  taking superior, inferior, deep, as well as medial in this area where I  thought that the clip was close to the margin.   I spent several minutes making sure everything was dry.  I had put a  little additional local anesthetic, it worked.  I then closed in layers  with 3-0 Vicryl, 4-0 Monocryl subcuticular, and finally some Dermabond  on  the skin.   Dr. Isaiah Blakes reported that we appeared to have the tissue appropriately  excised and as noted the margin was close, but we had re-excised that  area.   Sterile dressings were applied.  The patient tolerated the procedure  well.  There were no operative complications.  All counts were correct.      [name], M.D.  Electronically Signed     CJS/MEDQ  D:  02/28/2008  T:  02/29/2008  Job:  [id number]   cc:   Dr. Johnnette Gourd

## 2011-03-13 ENCOUNTER — Other Ambulatory Visit: Payer: Self-pay | Admitting: Gynecology

## 2011-03-13 NOTE — Discharge Summary (Signed)
Jackie Horton             ACCOUNT NO.:  [id number]   MEDICAL RECORD NO.:  [id number]          PATIENT TYPE:  INP   LOCATION:  5008                         FACILITY:  [facility]   PHYSICIAN:  Vonna Kotyk. Whitfield, M.D.DATE OF BIRTH:  20-Oct-1933   DATE OF ADMISSION:  05/21/2009  DATE OF DISCHARGE:  05/24/2009                               DISCHARGE SUMMARY   ADMISSION DIAGNOSIS:  Osteoarthritis of the right shoulder with rotator  cuff tear.   DISCHARGE DIAGNOSES:  1. Osteoarthritis of the right shoulder with rotator cuff tear.  2. History of left breast cancer.  3. History of hypertension.  4. History of hypothyroidism.  5. Cigarette smoker.  6. Posthemorrhagic anemia.  7. Hypoosmolality.  8. Hypokalemia.   PROCEDURE:  Right CTA shoulder arthroplasty.   HISTORY:  Jackie Horton is a very pleasant 77 year old female with  right shoulder pain for the past 1-1/2 years which has been worsening  over the years.  She has had a moderate aching and throbbing pain in the  shoulder with weakness noted.  MRI revealed glenohumeral DJD and  extensive full-thickness rotator cuff tear.  Has failed conservative  treatment.  For right shoulder hemiarthroplasty with CTA of head.   HOSPITAL COURSE:  A 77 year old female admitted on May 21, 2009, after  appropriate laboratory studies were obtained as well as Ancef 1 g IV, on-  call to the operating room and interscalene shoulder block, she went to  the operating room and had a right CTA hemiarthroplasty performed.  She  tolerated the procedure well.  Continued on Ancef 1 g IV q.6 h. x3  doses.  She is to be nonweightbearing on the right arm.  Started on  occupational therapy for pending all exercises.  She was allowed out of  bed to chair the following day.  She apparently had fallen on May 22, 2009, but did not told any of the staff until May 23, 2009 that she had  fallen and landed out on her right shoulder.  PA, lateral as well as  right shoulder x-rays were obtained that did not show any occult  injuries.  On May 23, 2009, she was given 30 mEq of KCl.  Remainder of  her hospital course actually was uneventful except for her fall and she  was discharged on May 24, 2009, to return back to the office in 10 days  for followup.   LABORATORY STUDIES:  Admitted with hemoglobin 15.4, hematocrit 44.9%,  white count 5100, platelet 171,000.  Discharge hemoglobin 11.9,  hematocrit 34.7%, white count 3600, platelet 140,000.  Protime 13.2, INR  1.0 and PTT was 31.  Preop sodium 133, potassium 3.5, chloride 99, CO2  24, glucose 91, BUN 11, creatinine 0.66.  Discharge sodium 134,  potassium 3.6, chloride 102, CO2 26, glucose 100, BUN 10, creatinine  0.64.  She dropped to potassium of 3.1 on May 24, 2009.  GFR was  greater than 60 throughout her hospital course.  Total protein preop  7.7, albumin 4.5, AST 25, ALT 28, ALP 64, bilirubin 0.5.  Glycosylated  hemoglobin 6.3.  TSH 6.515.  Urinalysis was  benign for voided urine.  Blood type was B positive, antibody screen negative.   DISCHARGE INSTRUCTIONS:  No restriction on her diet.  Keep incision  clean and dry.  Change dressing daily with 4 x 4s, sterile gauze, and  tape.  Leave white Steri-Strips alone.  Increase her activity slowly.  No driving or  lifting for 6 weeks.  Prescription for Norco 5/325 one to two tablets  every four as needed for pain.  She will follow back up with Dr.  Durward Fortes on May 27, 2009 and call for an appointment for that day.  She was discharged in improved condition.      Mike Craze Petrarca, P.A.-C.      Vonna Kotyk. Durward Fortes, M.D.  Electronically Signed    BDP/MEDQ  D:  06/06/2009  T:  06/07/2009  Job:  [id number]

## 2011-05-06 ENCOUNTER — Ambulatory Visit: Payer: Medicare Other | Admitting: Pulmonary Disease

## 2011-05-15 ENCOUNTER — Other Ambulatory Visit (INDEPENDENT_AMBULATORY_CARE_PROVIDER_SITE_OTHER): Payer: Medicare Other

## 2011-05-15 ENCOUNTER — Encounter: Payer: Self-pay | Admitting: Pulmonary Disease

## 2011-05-15 ENCOUNTER — Ambulatory Visit (INDEPENDENT_AMBULATORY_CARE_PROVIDER_SITE_OTHER): Payer: Medicare Other | Admitting: Pulmonary Disease

## 2011-05-15 DIAGNOSIS — I10 Essential (primary) hypertension: Secondary | ICD-10-CM

## 2011-05-15 DIAGNOSIS — E039 Hypothyroidism, unspecified: Secondary | ICD-10-CM

## 2011-05-15 DIAGNOSIS — M899 Disorder of bone, unspecified: Secondary | ICD-10-CM

## 2011-05-15 DIAGNOSIS — F17200 Nicotine dependence, unspecified, uncomplicated: Secondary | ICD-10-CM

## 2011-05-15 DIAGNOSIS — E7800 Pure hypercholesterolemia, unspecified: Secondary | ICD-10-CM

## 2011-05-15 DIAGNOSIS — C50919 Malignant neoplasm of unspecified site of unspecified female breast: Secondary | ICD-10-CM

## 2011-05-15 DIAGNOSIS — M1990 Unspecified osteoarthritis, unspecified site: Secondary | ICD-10-CM

## 2011-05-15 DIAGNOSIS — M949 Disorder of cartilage, unspecified: Secondary | ICD-10-CM

## 2011-05-15 DIAGNOSIS — K5900 Constipation, unspecified: Secondary | ICD-10-CM

## 2011-05-15 DIAGNOSIS — F17210 Nicotine dependence, cigarettes, uncomplicated: Secondary | ICD-10-CM

## 2011-05-15 DIAGNOSIS — M8580 Other specified disorders of bone density and structure, unspecified site: Secondary | ICD-10-CM | POA: Insufficient documentation

## 2011-05-15 NOTE — Patient Instructions (Signed)
Today we updated your med list in EPIC...    Continue your current meds the same for now...  Today we did your follow up blood work (Thyroid studies)...    Please call the PHONE TREE in a few days for your results...    Dial [id number] & when prompted enter your patient number followed by the # symbol...    Your patient number is:  [id number]  Call for any questions...  Let's plan a follow up visit in 4-6 months, sooner if needed for problems.Marland KitchenMarland Kitchen

## 2011-05-15 NOTE — Progress Notes (Signed)
Subjective:    Patient ID: Jackie Horton, female    DOB: Mar 21, 1933, 77 y.o.   MRN: [id number]  HPI 77 y/o BF here to establish as a new patient 4/12> referred by her boyfriend [name], & her neighbor [name]; she has been a long time pt of DrGegick who is now retiring & we have requested his records to review;  Current problem list compiled by her memory, her medication list, & hosp [place] records;  She still works as a Actuary at [employer]...  ~  July 20,2012:  59mo ROV & she reports a good interval only c/o some right shoulder pain for which she received a shot from DrWhitfield;  otherw feeling well, taking her meds regularly, & denies CP. palpit, SOB, dizzy, syncope, edema, etc;  We reviewed labs from prev visit & the only signif abn was the low TSH on her Synthroid 15mcg/d (TSH= 0.09)> clinically she is euthyroid, & appears to be taking med regularly;  F/u labs today showed TSH= 0.20 & FreeT4=1.15 (0.6-1.60)> we decided to leave the Synthroid dose the same for now...   PROBLEM LIST:       Cig smoker>  She smokes 5-10 cig/d now;  18 yr hx smoking all <1ppd;  Denies hx lung dis but notes some AM cough/ sputum & SOB/ DOE... ~  CXR 6/11 mild chr changes, clear/ NAD, right humeral head prosthesis...     HBP>  On Amlodipine 10mg /d, Maxzide 1/2 tab daily, & KCl 12mEq daily;  BP= 136/82, tolerating meds well, not checking BP at home, and denies CP/ palpit/ dizzy/ SOB/ edema/ etc...     Chol>  On Crestor 10mg  daily w/ good control of lipids she thinks, but we don't have labs to review yet & not fasting today;  We discussed getting old records & checking FLP on ret OV... ~  She has difficulty remembering to come to the office FASTING for blood work...     Hypothy>  Long hx hypothyroidism on replacement Rx w/ Synthroid 10mcg/d per DrGegick...  ~  Labs 4/12 revealed TSH= 0.09... over suppressed on 6mcg/d, Pharm indicates regular refills & just got 90d supply, continue same 7 recheck  96mo. ~  Labs 7/12 showed TSH= 0.20, FreeT4= 1.15 (0.6-1.60)... Clinically euthyroid & she prefers to continue same dose for now.     Constipation>  She tells me she goes only once per week & then only because she takes MOM;  She has NEVER HAD A COLONOSCOPY & is reluctant to consider this now;  We discussed Rx w/ Miralax & Senakot-S regularly, and referral to GI for constip eval & to consider the needed colonoscopy... ~  Labs 4/12 showed Hemoccults neg x6... She declined GI referral or eval...     Urinary retention>  She had episode of U retention 6/11 several weeks after her AP repair surg, & eval in ER (EChart review)...     GYN>  S/p hysterectomy 66yrs ago & A/P repair for pelvic organ prolapse 6/11 by DrLomax (symptoms improved post surg);  DrLomax does her PAP, Mammograms, etc;  She had breast bx= fibroadenoma in 2011...     Breast Cancer>  S/P surg for left breast cancer, DCIS 5/09 by DrStreck;  She had axillary lymph node bx 3/10- neg, & needle bx left breat 4/11 that was neg (fibrosis);  Oncology eval & f/u by DrMurinson on Tamoxifen 20mg /d & he sees her Q60mo;  Last mammogram ~84mo ago at [facility] was neg.Marland KitchenMarland Kitchen  DJD>  She has mod DJD esp in L/S spine w/ scoliosis on XRays;  She had right shoulder hemiarthroplasty 7/10 by DrWhitfield (right humeral head prosthesis seen on CXR now) & she has limited shoulder motion on exam... ~  7/12:  She reports cortisone shot in right shoulder from DrWhitfield for pain...     Osteop>  ? If she's had prev BMD at [facility], we will have to wait on old records to review;  She takes MVI, VitC, & VitD 1000u daily...     Health Maintenance>  She says that she gets the yearly Flu vaccine, but has never had the Pneumonia shot, & she thinks that maybe she had a tetanus shot several yrs ago;  GYN= DrLomax;  GI= we will refer to [facility] for eval constip & colonoscopy...   Past Surgical History  Procedure Date  . Partial hysterectomy 1972  . Breast lumpectomy  2009  . Right shoulder hemiarthroplasty per DrWhitfield...   . AP repair & bladder suspension by DrLomax 2011    Outpatient Encounter Prescriptions as of 05/15/2011  Medication Sig Dispense Refill  . amLODipine (NORVASC) 10 MG tablet Take 1 tablet (10 mg total) by mouth daily.  90 tablet  3  . levothyroxine (SYNTHROID, LEVOTHROID) 75 MCG tablet Take 1 tablet (75 mcg total) by mouth daily.  90 tablet  3  . polyethylene glycol powder (GLYCOLAX/MIRALAX) powder Take 17 g by mouth daily. 1 capful in water daily       . potassium chloride (KLOR-CON) 10 MEQ CR tablet Take 1 tablet (10 mEq total) by mouth daily.  90 tablet  3  . rosuvastatin (CRESTOR) 10 MG tablet Take 1 tablet (10 mg total) by mouth daily.  90 tablet  3  . senna-docusate (SENOKOT S) 8.6-50 MG per tablet Take 2 tablets by mouth at bedtime.        . tamoxifen (NOLVADEX) 20 MG tablet Take 20 mg by mouth daily.        Marland Kitchen triamterene-hydrochlorothiazide (MAXZIDE-25) 37.5-25 MG per tablet Take 1 tablet by mouth daily. Take 1/2 tablet by mouth daily  90 tablet  3    No Known Allergies   History   Social History  . Marital Status: Divorced    Spouse Name: N/A    Number of Children: 3  . Years of Education: N/A   Occupational History  . caregiver     works 3 days week at [place]  . Smoking status: Current Some Day Smoker -- 0.3 packs/day    Types: Cigarettes  . Smokeless tobacco: Never Used  . Alcohol Use: No  . Drug Use: No  . Sexually Active: Not on file   Other Topics Concern  . Not on file   Social History Narrative   2 sisters with breast cancer   Family History  Problem Relation Age of Onset  . Breast cancer Daughter   . Diabetes Daughter     Review of Systems    Constitutional:  Denies F/C/S, anorexia, unexpected weight change. HEENT:  No HA, visual changes, earache, nasal symptoms, sore throat, hoarseness. Resp:  min cough & am sputum; no hemoptysis; no SOB, tightness,  wheezing. Cardio:  No CP, palpit, DOE, orthopnea, edema. GI:  Denies N/V/D/C or blood in stool; no reflux, abd pain, distention, or gas. GU:  No dysuria, freq, urgency, hematuria, or flank pain. MS:  +joint pain, no swelling or tenderness; decr ROM left shoulder; no neck pain, back  pain, etc. Neuro:  No tremors, seizures, dizziness, syncope, weakness, numbness, gait abn. Skin:  No suspicious lesions or skin rash. Heme:  No adenopathy, bruising, bleeding. Psyche: Denies confusion, sleep disturbance, hallucinations, anxiety, depression.   Objective:   Physical Exam    WD, WN, 77 y/o BF in NAD... Vital Signs:  Reviewed...  General:  Alert & oriented; pleasant & cooperative... HEENT:  [place]/AT, EOM-wnl, PERRLA, EACs-clear, TMs-wnl, NOSE-clear, THROAT-clear & wnl. Neck:  Supple w/ fairROM; no JVD; normal carotid impulses w/o bruits; no thyromegaly or nodules palpated; no lymphadenopathy. Chest:  Clear to P & A; without wheezes/ rales/ or rhonchi heard... Heart:  Regular Rhythm; norm S1 & S2 without murmurs/ rubs/ or gallops detected... Abdomen:  Soft & nontender; normal bowel sounds; no organomegaly or masses palpated... Ext:  Normal ROM; without deformities or arthritic changes; no varicose veins, venous insuffic, or edema;  Pulses intact w/o bruits... Neuro:  CNs II-XII intact; motor testing normal; sensory testing normal; gait normal & balance OK... Derm:  No lesions noted; no rash etc... Lymph:  No cervical, supraclavicular, axillary, or inguinal adenopathy palpated...    Assessment & Plan:   Hypothyroid>  TSH still sl over suppressed but clinically euthyroid & she wants to continue same dose for now...  HBP>  Controlled on meds, continue same...  Cholesterol>  Continues on Crestor 10mg /d, needs to come fasting for f/u FLP...  Constip>  Resolved w/ Miralax & Senakopt-S but still needs GI eval & colonoscopy, stool cards were neg...  GYN>  Per DrLomax...  Hx Breast Cancer>  Followed  by DrMurinson on Tamoxifen...  DJD>  Followed by DrWhitfield, as above.Marland KitchenMarland Kitchen

## 2011-05-22 ENCOUNTER — Other Ambulatory Visit: Payer: Self-pay | Admitting: Orthopaedic Surgery

## 2011-05-22 DIAGNOSIS — M542 Cervicalgia: Secondary | ICD-10-CM

## 2011-05-27 ENCOUNTER — Ambulatory Visit
Admission: RE | Admit: 2011-05-27 | Discharge: 2011-05-27 | Disposition: A | Discharge status: Home / Self Care | Payer: Medicare Other | Source: Ambulatory Visit | Attending: Orthopaedic Surgery | Admitting: Orthopaedic Surgery

## 2011-05-27 DIAGNOSIS — M542 Cervicalgia: Secondary | ICD-10-CM

## 2011-06-05 ENCOUNTER — Other Ambulatory Visit (HOSPITAL_COMMUNITY): Payer: Self-pay | Admitting: Oncology

## 2011-06-05 ENCOUNTER — Encounter (HOSPITAL_BASED_OUTPATIENT_CLINIC_OR_DEPARTMENT_OTHER): Payer: Medicare Other | Admitting: Oncology

## 2011-06-05 DIAGNOSIS — C50419 Malignant neoplasm of upper-outer quadrant of unspecified female breast: Secondary | ICD-10-CM

## 2011-06-05 LAB — CBC WITH DIFFERENTIAL/PLATELET
Basophils Absolute: 0 10*3/uL (ref 0.0–0.1)
Eosinophils Absolute: 0.1 10*3/uL (ref 0.0–0.5)
HCT: 41.8 % (ref 34.8–46.6)
HGB: 14.3 g/dL (ref 11.6–15.9)
LYMPH%: 33.8 % (ref 14.0–49.7)
MCV: 91.2 fL (ref 79.5–101.0)
MONO%: 7.1 % (ref 0.0–14.0)
NEUT#: 4.9 10*3/uL (ref 1.5–6.5)
NEUT%: 57.9 % (ref 38.4–76.8)
Platelets: 199 10*3/uL (ref 145–400)
RDW: 14.1 % (ref 11.2–14.5)

## 2011-06-05 LAB — COMPREHENSIVE METABOLIC PANEL
Albumin: 3.7 g/dL (ref 3.5–5.2)
Alkaline Phosphatase: 76 U/L (ref 39–117)
BUN: 17 mg/dL (ref 6–23)
Glucose, Bld: 114 mg/dL — ABNORMAL HIGH (ref 70–99)
Potassium: 3.4 mEq/L — ABNORMAL LOW (ref 3.5–5.3)

## 2011-11-09 ENCOUNTER — Telehealth: Payer: Self-pay | Admitting: Oncology

## 2011-11-09 NOTE — Telephone Encounter (Signed)
Changed appt time for 1/22 appt to 10 am. Lmonvm informing pt.

## 2011-11-20 ENCOUNTER — Ambulatory Visit: Payer: Medicare Other | Admitting: Pulmonary Disease

## 2011-12-16 ENCOUNTER — Ambulatory Visit (INDEPENDENT_AMBULATORY_CARE_PROVIDER_SITE_OTHER)
Admission: RE | Admit: 2011-12-16 | Discharge: 2011-12-16 | Disposition: A | Discharge status: Home / Self Care | Payer: Medicare Other | Source: Ambulatory Visit | Attending: Pulmonary Disease | Admitting: Pulmonary Disease

## 2011-12-16 ENCOUNTER — Encounter: Payer: Self-pay | Admitting: Pulmonary Disease

## 2011-12-16 ENCOUNTER — Other Ambulatory Visit (INDEPENDENT_AMBULATORY_CARE_PROVIDER_SITE_OTHER): Payer: Medicare Other

## 2011-12-16 ENCOUNTER — Ambulatory Visit (INDEPENDENT_AMBULATORY_CARE_PROVIDER_SITE_OTHER): Payer: Medicare Other | Admitting: Pulmonary Disease

## 2011-12-16 VITALS — BP 120/76 | HR 84 | Temp 96.8°F | Ht 62.0 in | Wt 125.2 lb

## 2011-12-16 DIAGNOSIS — E7800 Pure hypercholesterolemia, unspecified: Secondary | ICD-10-CM

## 2011-12-16 DIAGNOSIS — E039 Hypothyroidism, unspecified: Secondary | ICD-10-CM

## 2011-12-16 DIAGNOSIS — I10 Essential (primary) hypertension: Secondary | ICD-10-CM

## 2011-12-16 DIAGNOSIS — F17200 Nicotine dependence, unspecified, uncomplicated: Secondary | ICD-10-CM

## 2011-12-16 DIAGNOSIS — J209 Acute bronchitis, unspecified: Secondary | ICD-10-CM

## 2011-12-16 DIAGNOSIS — F17210 Nicotine dependence, cigarettes, uncomplicated: Secondary | ICD-10-CM

## 2011-12-16 DIAGNOSIS — K5900 Constipation, unspecified: Secondary | ICD-10-CM

## 2011-12-16 DIAGNOSIS — C50919 Malignant neoplasm of unspecified site of unspecified female breast: Secondary | ICD-10-CM

## 2011-12-16 DIAGNOSIS — M1990 Unspecified osteoarthritis, unspecified site: Secondary | ICD-10-CM

## 2011-12-16 DIAGNOSIS — M8580 Other specified disorders of bone density and structure, unspecified site: Secondary | ICD-10-CM

## 2011-12-16 LAB — CBC WITH DIFFERENTIAL/PLATELET
Basophils Relative: 0.7 % (ref 0.0–3.0)
Hemoglobin: 15.3 g/dL — ABNORMAL HIGH (ref 12.0–15.0)
Lymphocytes Relative: 31.9 % (ref 12.0–46.0)
Monocytes Relative: 6.5 % (ref 3.0–12.0)
Neutro Abs: 7.7 10*3/uL (ref 1.4–7.7)
RBC: 4.95 Mil/uL (ref 3.87–5.11)
WBC: 12.8 10*3/uL — ABNORMAL HIGH (ref 4.5–10.5)

## 2011-12-16 LAB — LIPID PANEL
HDL: 43.7 mg/dL (ref >39.00)
LDL Cholesterol: 68 mg/dL (ref 0–99)
Total CHOL/HDL Ratio: 3
VLDL: 25.2 mg/dL (ref 0.0–40.0)

## 2011-12-16 LAB — BASIC METABOLIC PANEL
CO2: 22 mEq/L (ref 19–32)
Calcium: 10.1 mg/dL (ref 8.4–10.5)
GFR: 104.51 mL/min (ref >60.00)
Sodium: 141 mEq/L (ref 135–145)

## 2011-12-16 LAB — HEPATIC FUNCTION PANEL
AST: 18 U/L (ref 0–37)
Albumin: 4.4 g/dL (ref 3.5–5.2)
Alkaline Phosphatase: 57 U/L (ref 39–117)
Bilirubin, Direct: 0.2 mg/dL (ref 0.0–0.3)
Total Protein: 7.3 g/dL (ref 6.0–8.3)

## 2011-12-16 LAB — TSH: TSH: 0.38 u[IU]/mL (ref 0.35–5.50)

## 2011-12-16 MED ORDER — PANTOPRAZOLE SODIUM 40 MG PO TBEC: DELAYED_RELEASE_TABLET | ORAL | Status: DC

## 2011-12-16 MED ORDER — AZITHROMYCIN 250 MG PO TABS: ORAL_TABLET | ORAL | Status: AC

## 2011-12-16 NOTE — Progress Notes (Signed)
Subjective:    Patient ID: Jackie Horton, female    DOB: 10-02-1933, 78 y.o.   MRN: [id number]  HPI 78 y/o BF here to establish as a new patient 4/12> referred by her boyfriend [name], & her neighbor [name]; she has been a long time pt of DrGegick who is now retiring & we have requested his records to review;  Current problem list compiled by her memory, her medication list, & hosp [place] records;  She still works as a Actuary at [employer]...  ~  July 20,2012:  55mo ROV & she reports a good interval only c/o some right shoulder pain for which she received a shot from DrWhitfield;  otherw feeling well, taking her meds regularly, & denies CP. palpit, SOB, dizzy, syncope, edema, etc;  We reviewed labs from prev visit & the only signif abn was the low TSH on her Synthroid 68mcg/d (TSH= 0.09)> clinically she is euthyroid, & appears to be taking med regularly;  F/u labs today showed TSH= 0.20 & FreeT4=1.15 (0.6-1.60)> we decided to leave the Synthroid dose the same for now...  ~  December 16, 2011:  89mo ROV & she reports increased LBP for which she saw DrNewton & was given Vicodin for prn use (she reports that it doesn't help that much)... She also notes some GERD, reflux symptoms & we discussed options for further eval by GI vs trial PPI therapy & she requests the latter- given Protonix40...    Cig Smoker> still smoking 1/4-1/2 ppd; denies resp symptoms other than mild chr AM cough; on no regular meds/ inhalers; CXR shows NAD; needs baseline PFTs...    HBP> on Amlod10, Maxzide-25 1/2 tab & K10; BP=120/76 & she is asymptomatic w/o CP, palpit, SOB, edema, etc...    Chol> on Cres10; FLP shows TChol 137, TG 126, HDL 44, LDL 68    Hypothy> on Synth75; prev TSH was oversuppressed but FreeT4 was wnl; TSH today= 0.38 & she is clinically euthyroid...    GERD> as noted- new symptom 2/13 but she declined GI eval & prefers therapeutic trial w/ Protonix40...    Constip> prev she went only once per  week; now on Miralax & Senakot-S w/ better regularity...    Hx Breast Cancer> followed by DrMurinson on Tamoxifen; doing satis w/o known recurrence; she is due for a 79mo check up soon...    DJD> hx prev right shoulder surg by DrWhitfield & he sent her to [facility] for LBP w/ MRI done & shot given + Hydrocodone Rx... CXR 2/13 showed normal heart size, clear lungs, right humeral head prosthesis, mild DJD Tspine... LABS 2/13:  FLP- at goals on Cres10;  Chems- wnl;  CBC- wnl;  TSH=0.38 on Synth75...   PROBLEM LIST:       Cig smoker>  She smokes 5-10 cig/d now;  25 yr hx smoking all <1ppd;  Denies hx lung dis but notes some AM cough/ sputum & SOB/ DOE... ~  CXR 6/11 mild chr changes, clear/ NAD, right humeral head prosthesis...     HBP>  On Amlodipine 10mg /d, Maxzide 1/2 tab daily, & KCl 63mEq daily;   ~  7/12:  BP= 136/82, tolerating meds well, not checking BP at home, and denies CP/ palpit/ dizzy/ SOB/ edema/ etc... ~  2/13:  On Amlod10 & Maxzide25- 1/2 tab> BP= 120/76 & she remains asymptomatic; BUN=18, Creat=0.7, K=4.0 on 81mEq KCl daily.     Chol>  On Crestor 10mg  daily w/ good control of lipids she thinks, but  we don't have labs to review yet & not fasting today;  We discussed getting old records & checking FLP on ret OV... ~  She has difficulty remembering to come to the office FASTING for blood work... ~  [place] 2/13 on Cres10 showed TChol 137, TG 126, HDL 44, LDL 68     Hypothy>  Long hx hypothyroidism on replacement Rx w/ Synthroid 41mcg/d per DrGegick...  ~  Labs 4/12 revealed TSH= 0.09... over suppressed on 54mcg/d, Pharm indicates regular refills & just got 90d supply, continue same 7 recheck 25mo. ~  Labs 7/12 showed TSH= 0.20, FreeT4= 1.15 (0.6-1.60)... Clinically euthyroid & she prefers to continue same dose for now. ~  Labs 2/13 showed TSH= 0.38 & she remains clinically euthyroid...     Constipation>  She tells me she goes only once per week & then only because she takes MOM;  She has  NEVER HAD A COLONOSCOPY & is reluctant to consider this now;  We discussed Rx w/ Miralax & Senakot-S regularly, and referral to GI for constip eval & to consider the needed colonoscopy... ~  Labs 4/12 showed Hemoccults neg x6... She declined GI referral or eval... ~  BMs more regular on Miralax & Senakot-S; she continues to refuse referral to GI to discuss colonoscopy...     Urinary retention>  She had episode of U retention 6/11 several weeks after her AP repair surg, & eval in ER (EChart review)...     GYN>  S/p hysterectomy 45yrs ago & A/P repair for pelvic organ prolapse 6/11 by DrLomax (symptoms improved post surg);  DrLomax does her PAP, Mammograms, etc;  She had breast bx= fibroadenoma in 2011...     Breast Cancer>  S/P surg for left breast cancer, DCIS 5/09 by DrStreck;  She had axillary lymph node bx 3/10- neg, & needle bx left breat 4/11 that was neg (fibrosis);  Oncology eval & f/u by DrMurinson on Tamoxifen 20mg /d & he sees her Q66mo;  Last mammogram ~53mo ago at [facility] was neg...     DJD>  She has mod DJD esp in L/S spine w/ scoliosis on XRays;  She had right shoulder hemiarthroplasty 7/10 by DrWhitfield (right humeral head prosthesis seen on CXR now) & she has limited shoulder motion on exam... ~  7/12:  She reports cortisone shot in right shoulder from DrWhitfield for pain...     Osteop>  ? If she's had prev BMD at [facility], we will have to wait on old records to review;  She takes MVI, VitC, & VitD 1000u daily...     Health Maintenance>  She says that she gets the yearly Flu vaccine, but has never had the Pneumonia shot, & she thinks that maybe she had a tetanus shot several yrs ago;  GYN= DrLomax;  GI= we will refer to [place] for eval constip & colonoscopy...   Past Surgical History  Procedure Date  . Partial hysterectomy 1972  . Breast lumpectomy 2009  . Right shoulder hemiarthroplasty per DrWhitfield...   . AP repair & bladder suspension by DrLomax 2011     Outpatient Encounter Prescriptions as of 12/16/2011  Medication Sig Dispense Refill  . amLODipine (NORVASC) 10 MG tablet Take 1 tablet (10 mg total) by mouth daily.  90 tablet  3  . HYDROcodone-acetaminophen (NORCO) 5-325 MG per tablet 1 tablet every 6-8 hrs as needed      . levothyroxine (SYNTHROID, LEVOTHROID) 75 MCG tablet Take 1 tablet (75 mcg total) by mouth daily.  [place]  tablet  3  . polyethylene glycol powder (GLYCOLAX/MIRALAX) powder Take 17 g by mouth daily. 1 capful in water daily       . potassium chloride (KLOR-CON) 10 MEQ CR tablet Take 1 tablet (10 mEq total) by mouth daily.  90 tablet  3  . rosuvastatin (CRESTOR) 10 MG tablet Take 1 tablet (10 mg total) by mouth daily.  90 tablet  3  . senna-docusate (SENOKOT S) 8.6-50 MG per tablet Take 2 tablets by mouth at bedtime.        . tamoxifen (NOLVADEX) 20 MG tablet Take 20 mg by mouth daily.        Marland Kitchen triamterene-hydrochlorothiazide (MAXZIDE-25) 37.5-25 MG per tablet Take 1/2 tablet by mouth daily      . DISCONTD: triamterene-hydrochlorothiazide (MAXZIDE-25) 37.5-25 MG per tablet Take 1 tablet by mouth daily. Take 1/2 tablet by mouth daily  90 tablet  3    No Known Allergies   Current Medications, Allergies, Past Medical History, Past Surgical History, Family History, and Social History were reviewed in [employer] record.    Review of Systems    Constitutional:  Denies F/C/S, anorexia, unexpected weight change. HEENT:  No HA, visual changes, earache, nasal symptoms, sore throat, hoarseness. Resp:  min cough & am sputum; no hemoptysis; no SOB, tightness, wheezing. Cardio:  No CP, palpit, DOE, orthopnea, edema. GI:  Denies N/V/D/C or blood in stool; no reflux, abd pain, distention, or gas. GU:  No dysuria, freq, urgency, hematuria, or flank pain. MS:  +joint pain, no swelling or tenderness; decr ROM left shoulder; no neck pain, back pain, etc. Neuro:  No tremors, seizures, dizziness, syncope, weakness,  numbness, gait abn. Skin:  No suspicious lesions or skin rash. Heme:  No adenopathy, bruising, bleeding. Psyche: Denies confusion, sleep disturbance, hallucinations, anxiety, depression.   Objective:   Physical Exam    WD, WN, 78 y/o BF in NAD... Vital Signs:  Reviewed...  General:  Alert & oriented; pleasant & cooperative... HEENT:  [place]/AT, EOM-wnl, PERRLA, EACs-clear, TMs-wnl, NOSE-clear, THROAT-clear & wnl. Neck:  Supple w/ fairROM; no JVD; normal carotid impulses w/o bruits; no thyromegaly or nodules palpated; no lymphadenopathy. Chest:  Clear to P & A; without wheezes/ rales/ or rhonchi heard... Heart:  Regular Rhythm; norm S1 & S2 without murmurs/ rubs/ or gallops detected... Abdomen:  Soft & nontender; normal bowel sounds; no organomegaly or masses palpated... Ext:  Normal ROM; without deformities or arthritic changes; no varicose veins, venous insuffic, or edema;  Pulses intact w/o bruits... Neuro:  CNs II-XII intact; motor testing normal; sensory testing normal; gait normal & balance OK... Derm:  No lesions noted; no rash etc... Lymph:  No cervical, supraclavicular, axillary, or inguinal adenopathy palpated...   RADIOLOGY DATA:  Reviewed in the EPIC EMR & discussed w/ the patient...    >>CXR 2/13 showed normal heart size, clear lungs, right humeral head prosthesis, mild DJD Tspine...  LABORATORY DATA:  Reviewed in the EPIC EMR & discussed w/ the patient...    >>LABS 2/13:  FLP- at goals on Cres10;  Chems- wnl;  CBC- wnl;  TSH=0.38 on Synth75...   Assessment & Plan:   Hypothyroid>  TSH still sl over suppressed but clinically euthyroid & she wants to continue same dose for now...  HBP>  Controlled on meds, continue same...  Cholesterol>  Continues on Crestor 10mg /d & FLP looks good on this dose...  Constip>  Resolved w/ Miralax & Senakopt-S but still needs GI eval & colonoscopy, stool cards  were neg...  GYN>  Per DrLomax...  Hx Breast Cancer>  Followed by DrMurinson on  Tamoxifen...  DJD>  Followed by DrWhitfield, as above...   Patient's Medications  New Prescriptions   PANTOPRAZOLE (PROTONIX) 40 MG TABLET    1 tablet by mouth 30 minutes before first meal of the day  Previous Medications   AMLODIPINE (NORVASC) 10 MG TABLET    Take 1 tablet (10 mg total) by mouth daily.   HYDROCODONE-ACETAMINOPHEN (NORCO) 5-325 MG PER TABLET    1 tablet every 6-8 hrs as needed   LEVOTHYROXINE (SYNTHROID, LEVOTHROID) 75 MCG TABLET    Take 1 tablet (75 mcg total) by mouth daily.   POLYETHYLENE GLYCOL POWDER (GLYCOLAX/MIRALAX) POWDER    Take 17 g by mouth daily. 1 capful in water daily    POTASSIUM CHLORIDE (KLOR-CON) 10 MEQ CR TABLET    Take 1 tablet (10 mEq total) by mouth daily.   ROSUVASTATIN (CRESTOR) 10 MG TABLET    Take 1 tablet (10 mg total) by mouth daily.   SENNA-DOCUSATE (SENOKOT S) 8.6-50 MG PER TABLET    Take 2 tablets by mouth at bedtime.     TAMOXIFEN (NOLVADEX) 20 MG TABLET    Take 20 mg by mouth daily.    Modified Medications   Modified Medication Previous Medication   TRIAMTERENE-HYDROCHLOROTHIAZIDE (MAXZIDE-25) 37.5-25 MG PER TABLET triamterene-hydrochlorothiazide (MAXZIDE-25) 37.5-25 MG per tablet      Take 1/2 tablet by mouth daily    Take 1 tablet by mouth daily. Take 1/2 tablet by mouth daily  Discontinued Medications   No medications on file

## 2011-12-16 NOTE — Patient Instructions (Signed)
Today we updated your med list in our EPIC system...    Continue your current medications the same...  For your Bronchitis:    Take the ZPak antibiotic as directed on the pack til gone...    Add OTC MUCINEX 1-2 tabs twice daily w/ lots of fluids for the thick phlegm...  We also added PROTONIX 40mg  one tab daily (best taken 30 min before a meal) for the reflux/ heartburn/ acid symptoms...  Today we did your follow up CXR & fasting blood work...    Please call the [facility]REE in a few days for your results...    Dial [id number] & when prompted enter your patient number followed by the # symbol...    Your patient number is:   [id number]  Call for any questions...  Let's plan another check up in 6 month's time.Marland KitchenMarland Kitchen

## 2011-12-18 ENCOUNTER — Encounter: Payer: Self-pay | Admitting: Oncology

## 2011-12-18 ENCOUNTER — Other Ambulatory Visit (HOSPITAL_BASED_OUTPATIENT_CLINIC_OR_DEPARTMENT_OTHER): Payer: Medicare Other | Admitting: Lab

## 2011-12-18 ENCOUNTER — Ambulatory Visit (HOSPITAL_BASED_OUTPATIENT_CLINIC_OR_DEPARTMENT_OTHER): Payer: Medicare Other | Admitting: Oncology

## 2011-12-18 VITALS — BP 165/95 | HR 74 | Temp 96.8°F | Ht 62.0 in | Wt 124.1 lb

## 2011-12-18 DIAGNOSIS — C50419 Malignant neoplasm of upper-outer quadrant of unspecified female breast: Secondary | ICD-10-CM

## 2011-12-18 DIAGNOSIS — C50919 Malignant neoplasm of unspecified site of unspecified female breast: Secondary | ICD-10-CM

## 2011-12-18 LAB — COMPREHENSIVE METABOLIC PANEL
AST: 18 U/L (ref 0–37)
Albumin: 4.6 g/dL (ref 3.5–5.2)
Alkaline Phosphatase: 63 U/L (ref 39–117)
Chloride: 105 mEq/L (ref 96–112)
Glucose, Bld: 91 mg/dL (ref 70–99)
Potassium: 3.9 mEq/L (ref 3.5–5.3)
Sodium: 140 mEq/L (ref 135–145)
Total Protein: 7.1 g/dL (ref 6.0–8.3)

## 2011-12-18 LAB — CBC WITH DIFFERENTIAL/PLATELET
Eosinophils Absolute: 0.1 10*3/uL (ref 0.0–0.5)
MCV: 93.3 fL (ref 79.5–101.0)
MONO#: 0.7 10*3/uL (ref 0.1–0.9)
MONO%: 7.5 % (ref 0.0–14.0)
NEUT#: 4.3 10*3/uL (ref 1.5–6.5)
RBC: 4.91 10*6/uL (ref 3.70–5.45)
RDW: 13.3 % (ref 11.2–14.5)
WBC: 9.5 10*3/uL (ref 3.9–10.3)
lymph#: 4.2 10*3/uL — ABNORMAL HIGH (ref 0.9–3.3)

## 2011-12-18 NOTE — Progress Notes (Signed)
CC:   [name], M.D. [name], Ph.D., M.D. Deborra Medina. Lenna Gilford, MD Johnnette Gourd, MD Selinda Orion, M.D.  PROBLEM LIST: 1. Ductal carcinoma in situ of the left breast, upper outer quadrant,     1-cm lesion, intermediate grade with an initially negative biopsy     on 12/28/2007, followed by needle localization on 02/28/2008.     Estrogen receptor and progesterone receptor were both 100%.  The     patient underwent radiation treatments to the left breast,     receiving 6040 cGy from 04/02/2008 through 05/17/2008.  She has     been on tamoxifen since late July 2009.  A left axillary lymph node     was biopsied on 12/28/2008 and was negative.  The patient remains     without evidence of recurrence and continues to have yearly     mammograms in March. 2. Hypertension. 3. Hypothyroidism. 4. Dyslipidemia. 5. Status post hysterectomy approximately 30 years ago.  The patient     continues to retain her ovaries. 6. Status post right shoulder surgery for torn rotator cuff 2 to     years ago. 72. Striking family history of breast cancer in the patient's sisters,     aunts, and daughter.  MEDICATIONS: 1. Norvasc 10 mg daily. 2. Zithromax 250 mg.  The patient has a prescription for an upper     respiratory infection.  She has not yet filled this prescription. 3. Norco 5/325 mg 1 every 6 to 8 hours for pain. 4. Levothyroxine 75 mcg daily. 5. Protonix 40 mg daily. 6. Polyethylene glycol 17 g daily. 7. Klor-Con 10 mEq CR daily. 8. Crestor 10 mg daily. 9. Senokot-S 2 tablets at bedtime. 10.Tamoxifen 20 mg daily.  This was started in late July 2009 and will     be given for a planned 5 years. 11.Maxzide-25 take a half a tablet daily.  HISTORY:  Ms. Shobe is a 78 years old and is being followed at the [facility] for DCIS involving the left breast dating back to the spring of 2009.  The patient remains on tamoxifen without any problems related to her treatment or  her DCIS.  She is due for mammograms on 01/06/2012.  Ms. Bode was last seen by [place] on 06/05/2011.  Her main problem is right shoulder pain, particularly worse with movement.  The patient apparently has had an injection in her shoulder area without benefit.  She is being followed by an orthopedic surgeon. An MRI done a few months ago showed herniated disk in her neck.  This really is the patient's main problem.  She also has what appears to be a mild upper respiratory infection and has a prescription for Zithromax which she has not yet filled.  Aside from this, the patient seems to be doing well.  She lives on her own and lives in her own home.  She drives.  She is independent.  PHYSICAL EXAMINATION:  General:  Ms. Bish is a spry 78 year old lady. She is clearly in some discomfort today due to her right shoulder pain with radiation to the right arm.  Vital Signs:  Weight is stable at 124 pounds, height 63 inches, blood pressure today 165/95.  The patient was made aware of her elevated blood pressure.  Other vital signs are normal.  HEENT:  There is no scleral icterus.  Mouth and pharynx are benign.  She has dentures.  Lymph:  There is no peripheral adenopathy palpable in  the neck, supraclavicular, axillary, or inguinal areas. Cardiac Exam:  Regular rhythm without murmur or rub.  Pulmonary Exam: Reveals some inspiratory rhonchi that are diffuse.  The patient does continue to smoke a pack of cigarettes every few days.  She was urged to try to stop smoking.  Breasts:  Fairly symmetric, pendulous, and dense with no suspicious findings.  No dimpling or nipple inversion.  No axillary adenopathy.  Abdomen:  Benign with no organomegaly or masses palpable.  Extremities:  No peripheral edema or clubbing.  No obvious lymphedema of the left arm.  Neurologic Exam:  Grossly normal.  Movement of the right arm is quite painful.  There is limitation of movement.  LABORATORY DATA:  Today white count  9.5, ANC 4.3, hemoglobin 15.3, hematocrit 45.8, platelets 209,000.  Chemistries today are pending. Chemistries from 06/05/2011 were normal except for a potassium of 3.4.  IMAGING STUDIES: 1. Diagnostic mammogram carried out on 01/02/2011 showed stable right     and left breast findings. 2. MRI of cervical spine without IV contrast on 05/27/2011 showed     large right to moderate disk protrusion at C7-T1 effacing the     ventral CSF and heading into the foramen with moderate to severe     right foraminal stenosis.  This could certainly affect the right C8     nerve roots.  There was moderate left foraminal stenosis at C7-T1.     Mild foraminal stenosis bilaterally at C6-7, moderate right and     mild left foraminal stenosis at C5-6, and moderate right foraminal     stenosis at C4-5.  There was also facet hypertrophy at C2-3 and C3-     4 without significant stenosis at either level. 3. Chest x-ray, 2-view, from 12/16/2011 showed no active disease.  IMPRESSION AND PLAN:  Ms. Sandifer continues to do well with regard to her ductal carcinoma in situ, now approaching 4 years from the time of diagnosis.  She has been on tamoxifen now for over 3-1/2 years and will continue on this until late July 2014.  Ms. Gerbitz is due for screening mammograms on March 13th.  I reviewed with her her family history, which is quite striking.  I do not believe we have ever referred her for genetic testing and I will make that referral.  She does retain her ovaries.  Clearly there may be implications for other female members of her family.  The patient clearly has severe cervical spine disease.  Physical exam today raises the question of whether the patient also has right shoulder pathology.  She has had surgery on her right shoulder for a torn rotator cuff a few years ago.  In fact, we do have an MRI of the right shoulder without IV contrast carried out on 02/06/2009 that showed extensive full- thickness rotator  cuff tear with joint effusion and extensive subacromial subdeltoid bursitis.  We will plan to see the patient again in 6 months, at which time we will check CBC and chemistries.    ______________________________ Jeanie Cooks, M.D. DSM/MEDQ  D:  12/18/2011  T:  12/18/2011  Job:  [id number]

## 2011-12-18 NOTE — Progress Notes (Signed)
This office note has been dictated.  [id number]

## 2011-12-31 ENCOUNTER — Encounter: Payer: Self-pay | Admitting: *Deleted

## 2012-01-08 ENCOUNTER — Other Ambulatory Visit: Payer: Self-pay | Admitting: Orthopedic Surgery

## 2012-01-08 DIAGNOSIS — M542 Cervicalgia: Secondary | ICD-10-CM

## 2012-01-13 ENCOUNTER — Ambulatory Visit
Admission: RE | Admit: 2012-01-13 | Discharge: 2012-01-13 | Disposition: A | Discharge status: Home / Self Care | Payer: Medicare Other | Source: Ambulatory Visit | Attending: Orthopedic Surgery | Admitting: Orthopedic Surgery

## 2012-01-13 DIAGNOSIS — M542 Cervicalgia: Secondary | ICD-10-CM

## 2012-01-13 MED ORDER — GADOBENATE DIMEGLUMINE 529 MG/ML IV SOLN
15.0000 mL | Freq: Once | INTRAVENOUS | Status: AC | PRN
  Administered 2012-01-13: 15 mL via INTRAVENOUS

## 2012-01-14 ENCOUNTER — Encounter: Payer: Medicare Other | Admitting: Genetic Counselor

## 2012-01-14 ENCOUNTER — Other Ambulatory Visit: Payer: Medicare Other

## 2012-01-19 ENCOUNTER — Telehealth: Payer: Self-pay | Admitting: *Deleted

## 2012-01-19 NOTE — Telephone Encounter (Signed)
Called patient to reschedule her genetics appt and she is going through a lot of pain with her neck and back right now.  She has an appt with a dr next week and would like to wait on scheduling the genetics appt until she finds out what they are going to do with her back.

## 2012-01-20 ENCOUNTER — Other Ambulatory Visit: Payer: Self-pay | Admitting: Orthopedic Surgery

## 2012-01-20 DIAGNOSIS — M4802 Spinal stenosis, cervical region: Secondary | ICD-10-CM

## 2012-01-22 ENCOUNTER — Other Ambulatory Visit: Payer: Medicare Other

## 2012-01-25 ENCOUNTER — Other Ambulatory Visit (HOSPITAL_COMMUNITY): Payer: Self-pay | Admitting: Oncology

## 2012-01-25 DIAGNOSIS — C50919 Malignant neoplasm of unspecified site of unspecified female breast: Secondary | ICD-10-CM

## 2012-01-26 ENCOUNTER — Ambulatory Visit
Admission: RE | Admit: 2012-01-26 | Discharge: 2012-01-26 | Disposition: A | Discharge status: Home / Self Care | Payer: Medicare Other | Source: Ambulatory Visit | Attending: Orthopedic Surgery | Admitting: Orthopedic Surgery

## 2012-01-26 DIAGNOSIS — M4802 Spinal stenosis, cervical region: Secondary | ICD-10-CM

## 2012-01-28 ENCOUNTER — Other Ambulatory Visit: Payer: Self-pay | Admitting: Pulmonary Disease

## 2012-01-28 MED ORDER — LEVOTHYROXINE SODIUM 75 MCG PO TABS: 75.0000 ug | ORAL_TABLET | Freq: Every day | ORAL | Status: DC

## 2012-01-28 MED ORDER — AMLODIPINE BESYLATE 10 MG PO TABS: 10.0000 mg | ORAL_TABLET | Freq: Every day | ORAL | Status: DC

## 2012-02-01 ENCOUNTER — Telehealth: Payer: Self-pay | Admitting: Pulmonary Disease

## 2012-02-01 NOTE — Telephone Encounter (Signed)
ATC no answer and unable to leave message.  

## 2012-02-02 MED ORDER — ROSUVASTATIN CALCIUM 10 MG PO TABS: 10.0000 mg | ORAL_TABLET | Freq: Every day | ORAL | Status: DC

## 2012-02-02 NOTE — Telephone Encounter (Signed)
Called pt's home # - [email or web address]

## 2012-02-02 NOTE — Telephone Encounter (Signed)
Refill of the crestor has been sent to the pts pharmacy with no additional refills.  Pt will need to keep appt in may with SN.

## 2012-02-29 ENCOUNTER — Telehealth: Payer: Self-pay | Admitting: Pulmonary Disease

## 2012-02-29 MED ORDER — ROSUVASTATIN CALCIUM 10 MG PO TABS: 10.0000 mg | ORAL_TABLET | Freq: Every day | ORAL | Status: DC

## 2012-02-29 NOTE — Telephone Encounter (Signed)
Pt last seen 11-2011 and has upcoming appt on 03-16-2012; sent 90 day supply no refills to [facility] is aware.

## 2012-03-16 ENCOUNTER — Ambulatory Visit (INDEPENDENT_AMBULATORY_CARE_PROVIDER_SITE_OTHER): Payer: Medicare Other | Admitting: Pulmonary Disease

## 2012-03-16 ENCOUNTER — Encounter: Payer: Self-pay | Admitting: Pulmonary Disease

## 2012-03-16 VITALS — BP 138/70 | HR 81 | Temp 97.0°F | Ht 62.0 in | Wt 128.0 lb

## 2012-03-16 DIAGNOSIS — M1990 Unspecified osteoarthritis, unspecified site: Secondary | ICD-10-CM

## 2012-03-16 DIAGNOSIS — E039 Hypothyroidism, unspecified: Secondary | ICD-10-CM

## 2012-03-16 DIAGNOSIS — M8580 Other specified disorders of bone density and structure, unspecified site: Secondary | ICD-10-CM

## 2012-03-16 DIAGNOSIS — C50919 Malignant neoplasm of unspecified site of unspecified female breast: Secondary | ICD-10-CM

## 2012-03-16 DIAGNOSIS — K5900 Constipation, unspecified: Secondary | ICD-10-CM

## 2012-03-16 DIAGNOSIS — E7800 Pure hypercholesterolemia, unspecified: Secondary | ICD-10-CM

## 2012-03-16 DIAGNOSIS — I10 Essential (primary) hypertension: Secondary | ICD-10-CM

## 2012-03-16 DIAGNOSIS — Z5320 Procedure and treatment not carried out because of patient's decision for unspecified reasons: Secondary | ICD-10-CM | POA: Insufficient documentation

## 2012-03-16 DIAGNOSIS — F17200 Nicotine dependence, unspecified, uncomplicated: Secondary | ICD-10-CM

## 2012-03-16 DIAGNOSIS — Z23 Encounter for immunization: Secondary | ICD-10-CM

## 2012-03-16 DIAGNOSIS — F17210 Nicotine dependence, cigarettes, uncomplicated: Secondary | ICD-10-CM

## 2012-03-16 NOTE — Progress Notes (Signed)
Subjective:    Patient ID: Jackie Horton, female    DOB: Nov 25, 1932, 78 y.o.   MRN: [id number]  HPI 78 y/o BF here to establish as a new patient 4/12> referred by her boyfriend [name], & her neighbor [name]; she has been a long time pt of DrGegick who is now retiring & we have requested his records to review;  Current problem list compiled by her memory, her medication list, & [facility] records;  She still works as a Actuary at [employer]...  ~  July 20,2012:  72mo ROV & she reports a good interval only c/o some right shoulder pain for which she received a shot from DrWhitfield;  otherw feeling well, taking her meds regularly, & denies CP. palpit, SOB, dizzy, syncope, edema, etc;  We reviewed labs from prev visit & the only signif abn was the low TSH on her Synthroid 104mcg/d (TSH= 0.09)> clinically she is euthyroid, & appears to be taking med regularly;  F/u labs today showed TSH= 0.20 & FreeT4=1.15 (0.6-1.60)> we decided to leave the Synthroid dose the same for now...  ~  December 16, 2011:  71mo ROV & she reports increased LBP for which she saw DrNewton & was given Vicodin for prn use (she reports that it doesn't help that much)... She also notes some GERD, reflux symptoms & we discussed options for further eval by GI vs trial PPI therapy & she requests the latter- given Protonix40...    Cig Smoker> still smoking 1/4-1/2 ppd; denies resp symptoms other than mild chr AM cough; on no regular meds/ inhalers; CXR shows NAD; needs baseline PFTs...    HBP> on Amlod10, Maxzide-25 1/2 tab & K10; BP=120/76 & she is asymptomatic w/o CP, palpit, SOB, edema, etc...    Chol> on Cres10; FLP shows TChol 137, TG 126, HDL 44, LDL 68    Hypothy> on Synth75; prev TSH was oversuppressed but FreeT4 was wnl; TSH today= 0.38 & she is clinically euthyroid...    GERD> as noted- new symptom 2/13 but she declined GI eval & prefers therapeutic trial w/ Protonix40...    Constip> prev she went only once per  week; now on Miralax & Senakot-S w/ better regularity...    Hx Breast Cancer> followed by DrMurinson on Tamoxifen; doing satis w/o known recurrence; she is due for a 25mo check up soon...    DJD> hx prev right shoulder surg by DrWhitfield & he sent her to [facility] for LBP w/ MRI done & shot given + Hydrocodone Rx... CXR 2/13 showed normal heart size, clear lungs, right humeral head prosthesis, mild DJD Tspine... LABS 2/13:  FLP- at goals on Cres10;  Chems- wnl;  CBC- wnl;  TSH=0.38 on Synth75...  ~  Mar 16, 2012:  4mo ROV & Jackie Horton is doing very well now, states that [place] put her on Lyrica and "it took all the pain out of my body"; otherw she has no new complaints or concerns;  She saw DrMurinson for f/u DCIS left breast2009- s/p XRT & on Tamoxifen, doing well...   it does not appear as though she ever received a Pneumonia shot in the past- so we will give her that today; she is prob also due for TDAP but we will wait til next OV... We reviewed prob list, meds, xrays and labs> see below>> CT & MRI CSpine by DrTooke 4/13 showed Cerv spondylosis & DDD w/ multilevel facet arthropathy & foraminal impingement...   PROBLEM LIST:  Cig smoker>  She smokes 5-10 cig/d now;  51 yr hx smoking all <1ppd;  Denies hx lung dis but notes some AM cough/ sputum & SOB/ DOE... ~  CXR 6/11 mild chr changes, clear/ NAD, right humeral head prosthesis... ~  CXR 2/13 showed stable heart shadow, clear lungs w/o acute dis, NAD...     HBP>  On Amlodipine 10mg /d, Maxzide 1/2 tab daily, & KCl 32mEq daily;   ~  7/12:  BP= 136/82, tolerating meds well, not checking BP at home, and denies CP/ palpit/ dizzy/ SOB/ edema/ etc... ~  2/13:  On Amlod10 & Maxzide25- 1/2 tab> BP= 120/76 & she remains asymptomatic; BUN=18, Creat=0.7, K=4.0 on 54mEq KCl daily. ~  5/13:  BP= 138/70 on same meds& she remains asymptomatic...     Chol>  On Crestor 10mg  daily w/ good control of lipids she thinks, but we don't have labs to review yet  & not fasting today;  We discussed getting old records & checking FLP on ret OV... ~  She has difficulty remembering to come to the office FASTING for blood work... ~  [place] 2/13 on Cres10 showed TChol 137, TG 126, HDL 44, LDL 68     Hypothy>  Long hx hypothyroidism on replacement Rx w/ Synthroid 48mcg/d per DrGegick...  ~  Labs 4/12 revealed TSH= 0.09... over suppressed on 9mcg/d, Pharm indicates regular refills & just got 90d supply, continue same 7 recheck 5mo. ~  Labs 7/12 showed TSH= 0.20, FreeT4= 1.15 (0.6-1.60)... Clinically euthyroid & she prefers to continue same dose for now. ~  Labs 2/13 showed TSH= 0.38 & she remains clinically euthyroid...     Constipation>  She tells me she goes only once per week & then only because she takes MOM;  She has NEVER HAD A COLONOSCOPY & is reluctant to consider this now;  We discussed Rx w/ Miralax & Senakot-S regularly, and referral to GI for constip eval & to consider the needed colonoscopy... ~  Labs 4/12 showed Hemoccults neg x6... She declined GI referral or eval... ~  BMs more regular on Miralax & Senakot-S; she continues to refuse referral to GI to discuss colonoscopy...     Urinary retention>  She had episode of U retention 6/11 several weeks after her AP repair surg, & eval in ER (EChart review)...     GYN>  S/p hysterectomy 70yrs ago & A/P repair for pelvic organ prolapse 6/11 by DrLomax (symptoms improved post surg);  DrLomax does her PAP, Mammograms, etc;  She had breast bx= fibroadenoma in 2011...     Breast Cancer>  S/P surg for left breast cancer, DCIS 5/09 by DrStreck;  She had axillary lymph node bx 3/10- neg, & needle bx left breat 4/11 that was neg (fibrosis);  Oncology eval & f/u by DrMurinson on Tamoxifen 20mg /d & he sees her Q61mo;  Last mammogram ~20mo ago at [facility] was neg...     DJD>  She has mod DJD esp in L/S spine w/ scoliosis on XRays;  She had right shoulder hemiarthroplasty 7/10 by DrWhitfield (right humeral head prosthesis  seen on CXR now) & she has limited shoulder motion on exam... ~  7/12:  She reports cortisone shot in right shoulder from DrWhitfield for pain... ~  4/13:  She had eval DrTooke for CSpine dis, & neck/ right arm pain> she reports the discomfort resolved w/ Lyrica...     Osteop>  ? If she's had prev BMD at [facility], we will have to wait on old  records to review;  She takes MVI, VitC, & VitD 1000u daily...     Health Maintenance>  She says that she gets the yearly Flu vaccine, but has never had the Pneumonia shot, & she thinks that maybe she had a tetanus shot several yrs ago;  GYN= DrLomax;  GI= we will refer to [facility] for eval constip & colonoscopy...   Past Surgical History  Procedure Date  . Partial hysterectomy 1972  . Breast lumpectomy 2009  . Right shoulder hemiarthroplasty per DrWhitfield...   . AP repair & bladder suspension by DrLomax 2011    Outpatient Encounter Prescriptions as of 03/16/2012  Medication Sig Dispense Refill  . amLODipine (NORVASC) 10 MG tablet Take 1 tablet (10 mg total) by mouth daily.  90 tablet  1  . levothyroxine (SYNTHROID, LEVOTHROID) 75 MCG tablet Take 1 tablet (75 mcg total) by mouth daily.  90 tablet  1  . pantoprazole (PROTONIX) 40 MG tablet 1 tablet by mouth 30 minutes before first meal of the day  30 tablet  5  . polyethylene glycol powder (GLYCOLAX/MIRALAX) powder Take 17 g by mouth daily. 1 capful in water daily       . potassium chloride (KLOR-CON) 10 MEQ CR tablet Take 1 tablet (10 mEq total) by mouth daily.  90 tablet  3  . pregabalin (LYRICA) 75 MG capsule Take 75 mg by mouth 2 (two) times daily.      . rosuvastatin (CRESTOR) 10 MG tablet Take 1 tablet (10 mg total) by mouth daily.  90 tablet  0  . senna-docusate (SENOKOT S) 8.6-50 MG per tablet Take 2 tablets by mouth at bedtime.        . tamoxifen (NOLVADEX) 20 MG tablet TAKE ONE TABLET EVERY DAY  90 tablet  1  . triamterene-hydrochlorothiazide (MAXZIDE-25) 37.5-25 MG per tablet Take 1/2  tablet by mouth daily      . DISCONTD: HYDROcodone-acetaminophen (NORCO) 5-325 MG per tablet 1 tablet every 6-8 hrs as needed        No Known Allergies   Current Medications, Allergies, Past Medical History, Past Surgical History, Family History, and Social History were reviewed in [employer] record.    Review of Systems    Constitutional:  Denies F/C/S, anorexia, unexpected weight change. HEENT:  No HA, visual changes, earache, nasal symptoms, sore throat, hoarseness. Resp:  min cough & am sputum; no hemoptysis; no SOB, tightness, wheezing. Cardio:  No CP, palpit, DOE, orthopnea, edema. GI:  Denies N/V/D/C or blood in stool; no reflux, abd pain, distention, or gas. GU:  No dysuria, freq, urgency, hematuria, or flank pain. MS:  +joint pain, no swelling or tenderness; decr ROM left shoulder; no neck pain, back pain, etc. Neuro:  No tremors, seizures, dizziness, syncope, weakness, numbness, gait abn. Skin:  No suspicious lesions or skin rash. Heme:  No adenopathy, bruising, bleeding. Psyche: Denies confusion, sleep disturbance, hallucinations, anxiety, depression.   Objective:   Physical Exam    WD, WN, 78 y/o BF in NAD... Vital Signs:  Reviewed...  General:  Alert & oriented; pleasant & cooperative... HEENT:  [place]/AT, EOM-wnl, PERRLA, EACs-clear, TMs-wnl, NOSE-clear, THROAT-clear & wnl. Neck:  Supple w/ fairROM; no JVD; normal carotid impulses w/o bruits; no thyromegaly or nodules palpated; no lymphadenopathy. Chest:  Clear to P & A; without wheezes/ rales/ or rhonchi heard... Heart:  Regular Rhythm; norm S1 & S2 without murmurs/ rubs/ or gallops detected... Abdomen:  Soft & nontender; normal bowel sounds; no organomegaly or  masses palpated... Ext:  Normal ROM; without deformities or arthritic changes; no varicose veins, venous insuffic, or edema;  Pulses intact w/o bruits... Neuro:  CNs II-XII intact; motor testing normal; sensory testing normal; gait normal  & balance OK... Derm:  No lesions noted; no rash etc... Lymph:  No cervical, supraclavicular, axillary, or inguinal adenopathy palpated...   RADIOLOGY DATA:  Reviewed in the EPIC EMR & discussed w/ the patient...    >>CXR 2/13 showed normal heart size, clear lungs, right humeral head prosthesis, mild DJD Tspine...  LABORATORY DATA:  Reviewed in the EPIC EMR & discussed w/ the patient...    >>LABS 2/13:  FLP- at goals on Cres10;  Chems- wnl;  CBC- wnl;  TSH=0.38 on Synth75...   Assessment & Plan:   Hypothyroid>  TSH still sl over suppressed but clinically euthyroid & she wants to continue same dose for now...  HBP>  Controlled on meds, continue same...  Cholesterol>  Continues on Crestor 10mg /d & FLP looks good on this dose...  Constip>  Resolved w/ Miralax & Senakopt-S but still needs GI eval & colonoscopy, stool cards were neg...  GYN>  Per DrLomax...  Hx Breast Cancer>  Followed by DrMurinson on Tamoxifen...  DJD>  Followed by DrWhitfield & Marcial Pacas, as above...   Patient's Medications  New Prescriptions   No medications on file  Previous Medications   AMLODIPINE (NORVASC) 10 MG TABLET    Take 1 tablet (10 mg total) by mouth daily.   LEVOTHYROXINE (SYNTHROID, LEVOTHROID) 75 MCG TABLET    Take 1 tablet (75 mcg total) by mouth daily.   PANTOPRAZOLE (PROTONIX) 40 MG TABLET    1 tablet by mouth 30 minutes before first meal of the day   POLYETHYLENE GLYCOL POWDER (GLYCOLAX/MIRALAX) POWDER    Take 17 g by mouth daily. 1 capful in water daily    POTASSIUM CHLORIDE (KLOR-CON) 10 MEQ CR TABLET    Take 1 tablet (10 mEq total) by mouth daily.   PREGABALIN (LYRICA) 75 MG CAPSULE    Take 75 mg by mouth 2 (two) times daily.   ROSUVASTATIN (CRESTOR) 10 MG TABLET    Take 1 tablet (10 mg total) by mouth daily.   SENNA-DOCUSATE (SENOKOT S) 8.6-50 MG PER TABLET    Take 2 tablets by mouth at bedtime.     TAMOXIFEN (NOLVADEX) 20 MG TABLET    TAKE ONE TABLET EVERY DAY    TRIAMTERENE-HYDROCHLOROTHIAZIDE (MAXZIDE-25) 37.5-25 MG PER TABLET    Take 1/2 tablet by mouth daily  Modified Medications   No medications on file  Discontinued Medications   HYDROCODONE-ACETAMINOPHEN (NORCO) 5-325 MG PER TABLET    1 tablet every 6-8 hrs as needed

## 2012-03-16 NOTE — Patient Instructions (Signed)
Today we updated your med list in our EPIC system...    Continue your current medications the same...  Today we gave you the PNEUMONIA vaccine (currently rec for one shot after age 76)...  Call for any problems...  Let's plan a follow up visit in the 4-6 month range.Marland KitchenMarland Kitchen

## 2012-04-26 ENCOUNTER — Other Ambulatory Visit: Payer: Self-pay | Admitting: Pulmonary Disease

## 2012-04-26 MED ORDER — ROSUVASTATIN CALCIUM 10 MG PO TABS: 10.0000 mg | ORAL_TABLET | Freq: Every day | ORAL | Status: DC

## 2012-04-26 MED ORDER — TRIAMTERENE-HCTZ 37.5-25 MG PO TABS: ORAL_TABLET | ORAL | Status: DC

## 2012-04-26 MED ORDER — POTASSIUM CHLORIDE ER 10 MEQ PO TBCR: 10.0000 meq | EXTENDED_RELEASE_TABLET | Freq: Every day | ORAL | Status: DC

## 2012-04-26 NOTE — Telephone Encounter (Signed)
Received faxed refill request from [facility] November for pt's crestor 10mg  QD, klor con 44meq QD, triam-hctz 37.5-25mg  1/2 tab QD.  Last ov with SN 03-16-12, next 09-16-12.  Refills sent electronically.

## 2012-06-15 ENCOUNTER — Ambulatory Visit: Payer: Medicare Other | Admitting: Pulmonary Disease

## 2012-06-16 ENCOUNTER — Encounter: Payer: Self-pay | Admitting: Oncology

## 2012-06-16 ENCOUNTER — Other Ambulatory Visit (HOSPITAL_BASED_OUTPATIENT_CLINIC_OR_DEPARTMENT_OTHER): Payer: Medicare Other

## 2012-06-16 ENCOUNTER — Ambulatory Visit (HOSPITAL_BASED_OUTPATIENT_CLINIC_OR_DEPARTMENT_OTHER): Payer: Medicare Other | Admitting: Oncology

## 2012-06-16 ENCOUNTER — Telehealth: Payer: Self-pay | Admitting: Oncology

## 2012-06-16 VITALS — BP 143/75 | HR 91 | Temp 97.8°F | Resp 18 | Ht 62.0 in | Wt 128.6 lb

## 2012-06-16 DIAGNOSIS — D0590 Unspecified type of carcinoma in situ of unspecified breast: Secondary | ICD-10-CM

## 2012-06-16 DIAGNOSIS — C50919 Malignant neoplasm of unspecified site of unspecified female breast: Secondary | ICD-10-CM

## 2012-06-16 LAB — CBC WITH DIFFERENTIAL/PLATELET
EOS%: 1.7 % (ref 0.0–7.0)
Eosinophils Absolute: 0.1 10*3/uL (ref 0.0–0.5)
LYMPH%: 53.5 % — ABNORMAL HIGH (ref 14.0–49.7)
MCH: 31 pg (ref 25.1–34.0)
MCHC: 33.7 g/dL (ref 31.5–36.0)
MCV: 91.8 fL (ref 79.5–101.0)
MONO%: 7.8 % (ref 0.0–14.0)
Platelets: 155 10*3/uL (ref 145–400)
RBC: 4.6 10*6/uL (ref 3.70–5.45)
RDW: 13.7 % (ref 11.2–14.5)

## 2012-06-16 LAB — COMPREHENSIVE METABOLIC PANEL
AST: 19 U/L (ref 0–37)
Albumin: 4 g/dL (ref 3.5–5.2)
Alkaline Phosphatase: 73 U/L (ref 39–117)
Glucose, Bld: 110 mg/dL — ABNORMAL HIGH (ref 70–99)
Potassium: 3.4 mEq/L — ABNORMAL LOW (ref 3.5–5.3)
Sodium: 140 mEq/L (ref 135–145)
Total Bilirubin: 0.2 mg/dL — ABNORMAL LOW (ref 0.3–1.2)
Total Protein: 7 g/dL (ref 6.0–8.3)

## 2012-06-16 LAB — LACTATE DEHYDROGENASE: LDH: 207 U/L (ref 94–250)

## 2012-06-16 NOTE — Progress Notes (Signed)
This office note has been dictated.  [id number]

## 2012-06-16 NOTE — Telephone Encounter (Signed)
appts made and printed for pt aom °

## 2012-06-17 NOTE — Progress Notes (Signed)
CC:   Jackie Horton, M.D. Jackie Horton, Ph.D., M.D. Jackie Horton. Jackie Gilford, MD Jackie Gourd, MD Jackie Horton, M.D.   PROBLEM LIST:  1. Ductal carcinoma in situ of the left breast, upper outer quadrant,  1-cm lesion, intermediate grade with an initially negative biopsy  on 12/28/2007, followed by needle localization on 02/28/2008.  Estrogen receptor and progesterone receptor were both 100%. The  patient underwent radiation treatments to the left breast,  receiving 6040 cGy from 04/02/2008 through 05/17/2008. She has  been on tamoxifen since late July 2009. A left axillary lymph node  was biopsied on 12/28/2008 and was negative. The patient remains  without evidence of recurrence and continues to have yearly  mammograms in March.  2. Hypertension.  3. Hypothyroidism.  4. Dyslipidemia.  5. Status post hysterectomy approximately 30 years ago. The patient  continues to retain her ovaries.  6. Status post right shoulder surgery for torn rotator cuff 2 to  years ago.  35. Striking family history of breast cancer in the patient's sisters,  aunts, and daughter.    MEDICATIONS:  1. Norvasc 10 mg daily.  2. Lyrica 75 mg twice daily. 3. Norco 5/325 mg 1 every 6 to 8 hours for pain.  4. Levothyroxine 75 mcg daily.  5. Protonix 40 mg daily.  6. Polyethylene glycol 17 g daily.  7. Klor-Con 10 mEq CR daily.  8. Crestor 10 mg daily.  9. Senokot-S 2 tablets at bedtime.  10.Tamoxifen 20 mg daily. This was started in late July 2009 and will  be given for a planned 5 years.  11.Maxzide-25 take a half a tablet daily.   SMOKING HISTORY:  The patient smokes a pack of cigarettes every few days.  HISTORY:  I saw Jackie Horton for follow up of her DCIS involving the left breast dating back to the spring of 2009.  The patient was last seen by [place] on 12/18/2011.  The patient remains on tamoxifen without any problems related to her DCIS or the current treatment.  She underwent mammograms  at [facility] on 01/06/2012.  These were negative.  The patient reports that the symptoms that she was having in her neck and right shoulder have improved.  She is now on Lyrica.  She is without any complaints today.  The patient lives on her own.  She has her own home. She drives and is independent.  PHYSICAL EXAMINATION:  Jackie Horton is a spry lady of 81, soon to be 72. Weight is 128.6 pounds, height 5 feet 2 inches, body surface area 1.6 m squared.  Blood pressure 143/75.  Other vital signs are normal.  There is no scleral icterus.  Mouth and pharynx are benign.  There is no peripheral adenopathy palpable.  The patient has dentures.  No axillary or inguinal adenopathy.  Lungs:  Clear to percussion and auscultation. Cardiac:  Regular rhythm without murmur or rub.  Breasts:  Fairly symmetric, pendulous and dense with no suspicious findings.  She has well-healed incision in the upper outer quadrant of the left breast.  No dimpling or nipple inversion.  No axillary adenopathy.  Abdomen:  Benign with no organomegaly or masses palpable.  The patient does have palmar erythema.  There are some stasis changes in the lower legs.  No peripheral edema or clubbing.  No obvious lymphedema of the left arm. Neurologic exam:  Normal.  LABORATORY DATA:  Today white count 6.4, ANC 2.3, hemoglobin 14.2, hematocrit 42.3, platelets 155,000.  Chemistries today are notable  for potassium of 3.4, and a glucose of 110, otherwise normal.   IMAGING STUDIES:  1. Diagnostic mammogram carried out on 01/02/2011 showed stable right  and left breast findings.  2. MRI of cervical spine without IV contrast on 05/27/2011 showed  large right to moderate disk protrusion at C7-T1 effacing the  ventral CSF and heading into the foramen with moderate to severe  right foraminal stenosis. This could certainly affect the right C8  nerve roots. There was moderate left foraminal stenosis at C7-T1.  Mild foraminal stenosis  bilaterally at C6-7, moderate right and  mild left foraminal stenosis at C5-6, and moderate right foraminal  stenosis at C4-5. There was also facet hypertrophy at C2-3 and C3-  4 without significant stenosis at either level.  3. Chest x-ray, 2-view, from 12/16/2011 showed no active disease. 4. Diagnostic bilateral digital mammograms from 01/06/2012 carried out     at [facility] were negative. 5. MRI of the cervical spine without IV contrast from 01/13/2012     showed prominent soft disk protrusion at C7-T1, to the right seen     on the prior cervical MRI from 05/27/2011 has almost completely     resolved.  There is a tiny residual protrusion which might still     affect the right C8 nerve.  There is stable degenerative disk and     joint disease in the remainder of the cervical spine. 6. CT scan of the cervical spine without IV contrast on 01/26/2012     showed cervical spondylosis and degenerative disk disease with     multilevel facet arthropathy and findings most compatible with     foraminal impingement at C5-C6, C6-7 and C7-T1.  There is fusion of     the left C3-4 facets along with mild interspinous fusion at this     level.  IMPRESSION AND PLAN:  Jackie Horton continues to do well with regard to her ductal carcinoma in situ of the left breast, now 4-1/2 years from the time of diagnosis.  The patient has been on tamoxifen now for approximately 4 years and has another year of tamoxifen before completing 5 years worth of treatment.  I should mention that the patient did not keep her genetics appointment. Once again, I have urged her to follow through with this.  She is agreeable to having a genetics referral and I have therefore, resubmitted the consultation given the strong family history with several members of her family having had breast cancer. We will plan to see Jackie Horton again in 6 months at which time we will check CBC  and chemistries.    ______________________________ Jackie Horton, M.D. DSM/MEDQ  D:  06/16/2012  T:  06/17/2012  Job:  [id number]

## 2012-07-21 ENCOUNTER — Other Ambulatory Visit: Payer: Self-pay | Admitting: Oncology

## 2012-07-21 DIAGNOSIS — C50919 Malignant neoplasm of unspecified site of unspecified female breast: Secondary | ICD-10-CM

## 2012-07-22 ENCOUNTER — Ambulatory Visit (INDEPENDENT_AMBULATORY_CARE_PROVIDER_SITE_OTHER): Payer: Medicare Other

## 2012-07-22 DIAGNOSIS — Z23 Encounter for immunization: Secondary | ICD-10-CM

## 2012-07-25 DIAGNOSIS — Z23 Encounter for immunization: Secondary | ICD-10-CM

## 2012-07-26 ENCOUNTER — Telehealth: Payer: Self-pay | Admitting: Oncology

## 2012-07-26 NOTE — Telephone Encounter (Signed)
l/m for her to call regrding genetics appt    aom

## 2012-07-28 ENCOUNTER — Other Ambulatory Visit: Payer: Self-pay | Admitting: Pulmonary Disease

## 2012-09-16 ENCOUNTER — Ambulatory Visit: Payer: Medicare Other | Admitting: Pulmonary Disease

## 2012-09-27 ENCOUNTER — Ambulatory Visit: Payer: Medicare Other | Admitting: Pulmonary Disease

## 2012-10-28 ENCOUNTER — Other Ambulatory Visit: Payer: Self-pay | Admitting: *Deleted

## 2012-10-28 MED ORDER — LEVOTHYROXINE SODIUM 75 MCG PO TABS: 75.0000 ug | ORAL_TABLET | Freq: Every day | ORAL | Status: DC

## 2012-11-09 ENCOUNTER — Ambulatory Visit: Payer: Medicare Other | Admitting: Pulmonary Disease

## 2012-11-16 ENCOUNTER — Telehealth: Payer: Self-pay | Admitting: Oncology

## 2012-11-16 NOTE — Telephone Encounter (Signed)
S/w pt re new appt for 2/13 due to poof 2/14. Per pt appt moved to 2/21 @ 10:30am.

## 2012-12-08 ENCOUNTER — Other Ambulatory Visit: Payer: Medicare Other | Admitting: Lab

## 2012-12-08 ENCOUNTER — Ambulatory Visit: Payer: Medicare Other | Admitting: Family

## 2012-12-09 ENCOUNTER — Other Ambulatory Visit: Payer: Medicare Other | Admitting: Lab

## 2012-12-09 ENCOUNTER — Ambulatory Visit: Payer: Medicare Other | Admitting: Oncology

## 2012-12-15 ENCOUNTER — Other Ambulatory Visit: Payer: Self-pay | Admitting: Pulmonary Disease

## 2012-12-16 ENCOUNTER — Other Ambulatory Visit (HOSPITAL_BASED_OUTPATIENT_CLINIC_OR_DEPARTMENT_OTHER): Payer: Medicare Other | Admitting: Lab

## 2012-12-16 ENCOUNTER — Ambulatory Visit (HOSPITAL_BASED_OUTPATIENT_CLINIC_OR_DEPARTMENT_OTHER): Payer: Medicare Other | Admitting: Physician Assistant

## 2012-12-16 ENCOUNTER — Telehealth: Payer: Self-pay | Admitting: Oncology

## 2012-12-16 VITALS — BP 141/81 | HR 86 | Temp 96.9°F | Resp 18 | Ht 62.0 in | Wt 128.9 lb

## 2012-12-16 DIAGNOSIS — R071 Chest pain on breathing: Secondary | ICD-10-CM

## 2012-12-16 DIAGNOSIS — C50919 Malignant neoplasm of unspecified site of unspecified female breast: Secondary | ICD-10-CM

## 2012-12-16 DIAGNOSIS — D0590 Unspecified type of carcinoma in situ of unspecified breast: Secondary | ICD-10-CM

## 2012-12-16 DIAGNOSIS — Z803 Family history of malignant neoplasm of breast: Secondary | ICD-10-CM

## 2012-12-16 LAB — CBC WITH DIFFERENTIAL/PLATELET
Basophils Absolute: 0.1 10*3/uL (ref 0.0–0.1)
EOS%: 1.3 % (ref 0.0–7.0)
HGB: 14.9 g/dL (ref 11.6–15.9)
MCH: 30.7 pg (ref 25.1–34.0)
MCHC: 33.7 g/dL (ref 31.5–36.0)
MCV: 91 fL (ref 79.5–101.0)
MONO%: 7.8 % (ref 0.0–14.0)
RDW: 13.6 % (ref 11.2–14.5)

## 2012-12-16 LAB — COMPREHENSIVE METABOLIC PANEL (CC13)
AST: 17 U/L (ref 5–34)
Albumin: 3.9 g/dL (ref 3.5–5.0)
Alkaline Phosphatase: 87 U/L (ref 40–150)
BUN: 12.5 mg/dL (ref 7.0–26.0)
Creatinine: 0.8 mg/dL (ref 0.6–1.1)
Potassium: 3.6 mEq/L (ref 3.5–5.1)

## 2012-12-16 NOTE — Telephone Encounter (Signed)
Gave pt appt for lab and MD on August 2014 °

## 2012-12-16 NOTE — Progress Notes (Signed)
Jackie Horton  [phone]   CC: Jackie Horton, M.D.  Jackie Horton, Ph.D., M.D.  Jackie Horton. Jackie Gilford, MD  Jackie Gourd, MD  Jackie Horton, M.D.   OFFICE PROGRESS NOTE  PROBLEM LIST:   1. Ductal carcinoma in situ of the left breast, upper outer quadrant, 1-cm lesion, intermediate grade with an initially negative biopsy on 12/28/2007, followed by needle localization on 02/28/2008. Estrogen receptor and progesterone receptor were both 100%. The patient underwent radiation treatments to the left breast, receiving 6040 cGy from 04/02/2008 through 05/17/2008. She has been on tamoxifen since late July 2009. A left axillary lymph node was biopsied on 12/28/2008 and was negative. The patient remains without evidence of recurrence and continues to have yearly mammograms in March.  2. Hypertension.  3. Hypothyroidism.  4. Dyslipidemia.  5. Status post hysterectomy approximately 30 years ago. The patient continues to retain her ovaries.  6. Status post right shoulder surgery for torn rotator cuff 2 to years ago.  44. Striking family history of breast cancer in the patient's sisters, aunts, and daughter.  SMOKING HISTORY: The patient smokes a pack of cigarettes every few days.     INTERIM HISTORY Jackie Horton returns today for follow up visit for  DCIS involving the left breast dating back to the spring of 2009. The patient was last seen by [place] on 06/16/2012. The patient remains on tamoxifen without any problems related to her DCIS or the current treatment. She underwent mammograms at [facility] on 01/06/2012. These were negative.She is due for another one this year.  The patient reports that the symptoms that she was having in her neck and right shoulder have improved with Lyrica. Her only complaint today is indeterminate and left sided chest wall pain for the last 4-5 months, worse in the morning. The pain is not directly on the breast tissue.her last chest x-ray  was performed in March of 2013,without abnormalities.She denies any hot flashes, nipple discharge, or nipple inversion. She remains The patient lives on her own.She drives and is independent.     MEDICATIONS:  Current Outpatient Prescriptions  Medication Sig Dispense Refill  . amLODipine (NORVASC) 10 MG tablet TAKE ONE TABLET EACH DAY  90 tablet  3  . levothyroxine (SYNTHROID, LEVOTHROID) 75 MCG tablet Take 1 tablet (75 mcg total) by mouth daily.  90 tablet  0  . polyethylene glycol powder (GLYCOLAX/MIRALAX) powder Take 17 g by mouth daily. 1 capful in water daily       . potassium chloride (K-DUR) 10 MEQ tablet TAKE ONE TABLET BY MOUTH ONCE DAILY  90 tablet  0  . pregabalin (LYRICA) 75 MG capsule Take 75 mg by mouth 2 (two) times daily.      . rosuvastatin (CRESTOR) 10 MG tablet Take 1 tablet (10 mg total) by mouth daily.  90 tablet  1  . senna-docusate (SENOKOT S) 8.6-50 MG per tablet Take 2 tablets by mouth at bedtime.        . tamoxifen (NOLVADEX) 20 MG tablet TAKE ONE TABLET EVERY DAY  90 tablet  1  . triamterene-hydrochlorothiazide (MAXZIDE-25) 37.5-25 MG per tablet Take 1/2 tablet by mouth daily  45 tablet  1  . pantoprazole (PROTONIX) 40 MG tablet 1 tablet by mouth 30 minutes before first meal of the day  30 tablet  5   No current facility-administered medications for this visit.    ALLERGIES:  No Known Allergies   PHYSICAL EXAMINATION:   Filed Vitals:  12/16/12 1101  BP: 141/81  Pulse: 86  Temp: 96.9 F (36.1 C)  Resp: 18   Filed Weights   12/16/12 1101  Weight: 128 lb 14.4 oz (58.469 kg)    There is no scleral icterus. Mouth and pharynx are benign. There is no peripheral adenopathy palpable. The patient has dentures. No axillary or inguinal adenopathy. Lungs: Clear to percussion and auscultation. Cardiac: Regular rhythm without murmur or rub. Breasts: Fairly symmetric, pendulous and dense with no suspicious findings. She has well-healed incision in the upper outer  quadrant of the left breast. No dimpling or nipple inversion. No axillary adenopathy. The left chest wall area is somewhat tender to palpation, although no discrete masses are found. Abdomen: Benign with no organomegaly or masses palpable. The patient does have palmar  erythema. There are some stasis changes in the lower legs. No peripheral edema or clubbing. No obvious lymphedema of the left arm.  Neurologic exam: Normal.  LABORATORY/RADIOLOGY DATA:   Recent Labs Lab 12/16/12 1029  WBC 7.7  HGB 14.9  HCT 44.1  PLT 206  MCV 91.0  MCH 30.7  MCHC 33.7  RDW 13.6  LYMPHSABS 3.7*  MONOABS 0.6  EOSABS 0.1  BASOSABS 0.1    CMP    Recent Labs Lab 12/16/12 1029  NA 142  K 3.6  CL 108*  CO2 26  GLUCOSE 105*  BUN 12.5  CREATININE 0.8  CALCIUM 10.4  AST 17  ALT 19  ALKPHOS 87  BILITOT 0.46        Component Value Date/Time   BILITOT 0.46 12/16/2012 1029   BILITOT 0.2* 06/16/2012 1446   BILIDIR 0.2 12/16/2011 1231    Last visit on 06/16/2012 showed  white count 6.4, ANC 2.3, hemoglobin 14.2, hematocrit 42.3, platelets 155,000. Chemistries were notable for potassium of 3.4, and a glucose of 110, otherwise normal.      Liver Function Tests:  Recent Labs Lab 12/16/12 1029  AST 17  ALT 19  ALKPHOS 87  BILITOT 0.46  PROT 7.6  ALBUMIN 3.9    Radiology Studies:  1. Diagnostic mammogram carried out on 01/02/2011 showed stable right and left breast findings.  2. MRI of cervical spine without IV contrast on 05/27/2011 showed large right to moderate disk protrusion at C7-T1 effacing the ventral CSF and heading into the foramen with moderate to severe right foraminal stenosis. This could certainly affect the right C8 nerve roots. There was moderate left foraminal stenosis at C7-T1.  Mild foraminal stenosis bilaterally at C6-7, moderate right and mild left foraminal stenosis at C5-6, and moderate right foraminal stenosis at C4-5. There was also facet hypertrophy at C2-3  and C3- 4 without significant stenosis at either level.  3. Chest x-ray, 2-view, from 12/16/2011 showed no active disease.  4. Diagnostic bilateral digital mammograms from 01/06/2012 carried out at [facility] were negative.  5. MRI of the cervical spine without IV contrast from 01/13/2012 showed prominent soft disk protrusion at C7-T1, to the right seen on the prior cervical MRI from 05/27/2011 has almost completely resolved. There is a tiny residual protrusion which might still affect the right C8 nerve. There is stable degenerative disk and  joint disease in the remainder of the cervical spine.  6. CT scan of the cervical spine without IV contrast on 01/26/2012 showed cervical spondylosis and degenerative disk disease with multilevel facet arthropathy and findings most compatible with foraminal impingement at C5-C6, C6-7 and C7-T1. There is fusion of the left C3-4 facets along with  mild interspinous fusion at this level.     ASSESSMENT AND PLAN:    Jackie Horton continues to do well with regard to her ductal carcinoma in situ of the left breast, now 5 years from the time of diagnosis. The patient has been on tamoxifen now for approximately 41/2 years and has another year of tamoxifen before completing 5 years worth of treatment. The patient did not keep her genetics appointment.  given the strong family history with several members of her family having had breast cancer she has been urged to do so. As for her left chest wall pain, and these will be further discussed and evaluated by Dr. Lenna Horton, who is to see the patient on 12/21/2012. Until then, she is due to have another mammogram in 01/05/13, which results will be evaluated, rule out any abnormalities We will plan to see Jackie Horton again in 6 months at which time we will check CBC and chemistries.  Case  has been discussed with Dr. Ralene Ok, who agrees with the plan.  Sharene Butters E 12/16/2012, 12:13 PM

## 2012-12-16 NOTE — Patient Instructions (Addendum)
Follow up in 6 year with labs. Continue Tamoxifen as prescribed.

## 2012-12-21 ENCOUNTER — Ambulatory Visit (INDEPENDENT_AMBULATORY_CARE_PROVIDER_SITE_OTHER): Payer: Medicare Other | Admitting: Pulmonary Disease

## 2012-12-21 ENCOUNTER — Encounter: Payer: Self-pay | Admitting: Pulmonary Disease

## 2012-12-21 VITALS — BP 120/76 | HR 88 | Temp 97.0°F | Ht 62.0 in | Wt 128.0 lb

## 2012-12-21 DIAGNOSIS — M1990 Unspecified osteoarthritis, unspecified site: Secondary | ICD-10-CM

## 2012-12-21 DIAGNOSIS — R2681 Unsteadiness on feet: Secondary | ICD-10-CM

## 2012-12-21 DIAGNOSIS — M949 Disorder of cartilage, unspecified: Secondary | ICD-10-CM

## 2012-12-21 DIAGNOSIS — E039 Hypothyroidism, unspecified: Secondary | ICD-10-CM

## 2012-12-21 DIAGNOSIS — I10 Essential (primary) hypertension: Secondary | ICD-10-CM

## 2012-12-21 DIAGNOSIS — M899 Disorder of bone, unspecified: Secondary | ICD-10-CM

## 2012-12-21 DIAGNOSIS — K5900 Constipation, unspecified: Secondary | ICD-10-CM

## 2012-12-21 DIAGNOSIS — C50919 Malignant neoplasm of unspecified site of unspecified female breast: Secondary | ICD-10-CM

## 2012-12-21 DIAGNOSIS — C50912 Malignant neoplasm of unspecified site of left female breast: Secondary | ICD-10-CM

## 2012-12-21 DIAGNOSIS — M8580 Other specified disorders of bone density and structure, unspecified site: Secondary | ICD-10-CM

## 2012-12-21 DIAGNOSIS — R269 Unspecified abnormalities of gait and mobility: Secondary | ICD-10-CM

## 2012-12-21 DIAGNOSIS — E7800 Pure hypercholesterolemia, unspecified: Secondary | ICD-10-CM

## 2012-12-21 NOTE — Progress Notes (Signed)
Subjective:    Patient ID: Jackie Horton, female    DOB: [birth year], 79 y.o.   MRN: [id number]  HPI 79 y/o BF here to establish as a new patient 4/12> referred by her boyfriend [name], & her neighbor [name]; she has been a long time pt of DrGegick who is now retiring & we have requested his records to review;  Current problem list compiled by her memory, her medication list, & hosp [place] records;  She still works as a Actuary at [employer]...  ~  December 16, 2011:  23mo ROV & she reports increased LBP for which she saw DrNewton & was given Vicodin for prn use (she reports that it doesn't help that much)... She also notes some GERD, reflux symptoms & we discussed options for further eval by GI vs trial PPI therapy & she requests the latter- given Protonix40...    Cig Smoker> still smoking 1/4-1/2 ppd; denies resp symptoms other than mild chr AM cough; on no regular meds/ inhalers; CXR shows NAD; needs baseline PFTs...    HBP> on Amlod10, Maxzide-25 1/2 tab & K10; BP=120/76 & she is asymptomatic w/o CP, palpit, SOB, edema, etc...    Chol> on Cres10; FLP shows TChol 137, TG 126, HDL 44, LDL 68    Hypothy> on Synth75; prev TSH was oversuppressed but FreeT4 was wnl; TSH today= 0.38 & she is clinically euthyroid...    GERD> as noted- new symptom 2/13 but she declined GI eval & prefers therapeutic trial w/ [email or web address]...    Constip> prev she went only once per week; now on Miralax & Senakot-S w/ better regularity...    Hx Breast Cancer> followed by DrMurinson on Tamoxifen; doing satis w/o known recurrence; she is due for a 92mo check up soon...    DJD> hx prev right shoulder surg by DrWhitfield & he sent her to [facility] for LBP w/ MRI done & shot given + Hydrocodone Rx... CXR 2/13 showed normal heart size, clear lungs, right humeral head prosthesis, mild DJD Tspine... LABS 2/13:  FLP- at goals on Cres10;  Chems- wnl;  CBC- wnl;  TSH=0.38 on Synth75...  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   it does not appear as though she ever received a Pneumonia shot in the past- so we will give her that today; she is prob also due for TDAP but we will wait til next OV... We reviewed prob list, meds, xrays and labs> see below>> CT & MRI CSpine by DrTooke 4/13 showed Cerv spondylosis & DDD w/ multilevel facet arthropathy & foraminal impingement...   ~  December 21, 2012:  14mo ROV & Jackie Horton is stable but has mult somatic complaints- gives out after dinner, feels like she wobbles- but denies dizziness, not falling etc & we discussed poss Neuro eval; notes neck 7 arm pain better on Lyrica... We reviewed the following medical problems during today's office visit >>     Cig Smoker> still smoking 1/4-1/2 ppd; denies resp symptoms other than mild chr AM cough; on no regular meds/ inhalers; CXR shows NAD; needs baseline PFTs, & must quit smoking but declines smoking cessation help...    HBP> on Amlod10, Maxzide-25 1/2 tab & K10; BP=120/76 &  she is asymptomatic w/o CP, palpit, SOB, edema, etc...    Chol> on Cres10; FLP shows TChol 92, TG 67, HDL 36, LDL 42... Ok to decr the Cres10 to 1/2 tab daily...    Hypothy> on Synth75; prev TSH was oversuppressed but FreeT4 was wnl; TSH 2/13 = 0.38 & she is clinically euthyroid...    GERD> as noted- new symptom 2/13 but she declined GI eval & prefers therapeutic trial w/ [email or web address]...    Constip> prev she went only once per week; now on Miralax & Senakot-S w/ better regularity; she has never had a colonoscopy & refuses GI referral...     Hx Breast Cancer> followed by DrMurinson on Tamoxifen; doing satis w/o known recurrence; she was seen 8/13 & 2/14- notes reviewed...    DJD> hx prev right shoulder surg by DrWhitfield & he sent  her to [facility] for LBP w/ MRI done & shot given + [id number] & Hydrocodone Rx... We reviewed prob list, meds, xrays and labs> see below for updates >> she had the 2013 Flu vaccine 9/13...  LABS 2/14:  FLP= at goals on Cres10 & ok to decr to 1/2 tab daily...         PROBLEM LIST:       Cig smoker>  She smokes 5-10 cig/d now;  74 yr hx smoking all <1ppd;  Denies hx lung dis but notes some AM cough/ sputum & SOB/ DOE... ~  CXR 6/11 mild chr changes, clear/ NAD, right humeral head prosthesis... ~  CXR 2/13 showed stable heart shadow, clear lungs w/o acute dis, NAD...     HBP>  On Amlodipine 10mg /d, Maxzide 1/2 tab daily, & KCl 30mEq daily;   ~  7/12:  BP= 136/82, tolerating meds well, not checking BP at home, and denies CP/ palpit/ dizzy/ SOB/ edema/ etc... ~  2/13:  On Amlod10 & Maxzide25- 1/2 tab> BP= 120/76 & she remains asymptomatic; BUN=18, Creat=0.7, K=4.0 on 67mEq KCl daily. ~  5/13:  BP= 138/70 on same meds& she remains asymptomatic... ~  2/14:  On Amlod10, Maxzide-25 1/2 tab & K10; BP=120/76 & she is asymptomatic w/o CP, palpit, SOB, edema, etc.     Chol>  On Crestor 10mg  daily w/ good control of lipids she thinks, but we don't have labs to review yet & not fasting today;  We discussed getting old records & checking FLP on ret OV... ~  She has difficulty remembering to come to the office FASTING for blood work... ~  [place] 2/13 on Cres10 showed TChol 137, TG 126, HDL 44, LDL 68 ~  FLP 2/14 on Cres10 showed TChol 92, TG 67, HDL 36, LDL 42... OK to decr to 1/2 tab daily...     Hypothy>  Long hx hypothyroidism on replacement Rx w/ Synthroid 75mcg/d per DrGegick...  ~  Labs 4/12 revealed TSH= 0.09... over suppressed on 60mcg/d, Pharm indicates regular refills & just got 90d supply, continue same 7 recheck 10mo. ~  Labs 7/12 showed TSH= 0.20, FreeT4= 1.15 (0.6-1.60)... Clinically euthyroid & she prefers to continue same dose for now. ~  Labs 2/13 showed TSH= 0.38 & she remains clinically  euthyroid...     Constipation>  She tells me she goes only once per week & then only because she takes MOM;  She has NEVER HAD A COLONOSCOPY & is reluctant to consider this now;  We discussed Rx w/ Miralax & Senakot-S regularly, and referral to GI for constip eval & to consider the needed colonoscopy... ~  Labs  4/12 showed Hemoccults neg x6... She declined GI referral or eval... ~  BMs more regular on Miralax & Senakot-S; she continues to refuse referral to GI to discuss colonoscopy...     Urinary retention>  She had episode of U retention 6/11 several weeks after her AP repair surg, & eval in ER (EChart review)...     GYN>  S/p hysterectomy 66yrs ago & A/P repair for pelvic organ prolapse 6/11 by DrLomax (symptoms improved post surg);  DrLomax does her PAP, Mammograms, etc;  She had breast bx= fibroadenoma in 2011...     Breast Cancer>  S/P surg for left breast cancer, DCIS 5/09 by DrStreck;  She had axillary lymph node bx 3/10- neg, & needle bx left breat 4/11 that was neg (fibrosis);  Oncology eval & f/u by DrMurinson on Tamoxifen 20mg /d & he sees her Q72mo;  Last mammogram ~22mo ago at [facility] was neg... ~  She continues 45mo follow up visits w/ DrMurinson...     DJD>  She has mod DJD esp in L/S spine w/ scoliosis on XRays;  She had right shoulder hemiarthroplasty 7/10 by DrWhitfield (right humeral head prosthesis seen on CXR now) & she has limited shoulder motion on exam... ~  7/12:  She reports cortisone shot in right shoulder from DrWhitfield for pain... ~  4/13:  She had eval DrTooke for CSpine dis w/ neck/ right arm pain> CT/MRI Cspine 4/13 showed Cx spondylosis, anterolithesis, & DDD; she reports the discomfort resolved w/ Lyrica...     Osteop>  ? If she's had prev BMD at [facility], we will have to wait on old records to review;  She takes MVI, VitC, & VitD 1000u daily...     Health Maintenance>  She says that she gets the yearly Flu vaccine, but has never had the Pneumonia shot, & she  thinks that maybe she had a tetanus shot several yrs ago;  GYN= DrLomax;  GI= we will refer to [place] for eval constip & colonoscopy...   Past Surgical History  Procedure Date  . Partial hysterectomy 1972  . Breast lumpectomy 2009  . Right shoulder hemiarthroplasty per DrWhitfield...   . AP repair & bladder suspension by DrLomax 2011    Outpatient Encounter Prescriptions as of 12/21/2012  Medication Sig Dispense Refill  . amLODipine (NORVASC) 10 MG tablet TAKE ONE TABLET EACH DAY  90 tablet  3  . levothyroxine (SYNTHROID, LEVOTHROID) 75 MCG tablet Take 1 tablet (75 mcg total) by mouth daily.  90 tablet  0  . pantoprazole (PROTONIX) 40 MG tablet 1 tablet by mouth 30 minutes before first meal of the day  30 tablet  5  . polyethylene glycol powder (GLYCOLAX/MIRALAX) powder Take 17 g by mouth daily. 1 capful in water daily       . potassium chloride (K-DUR) 10 MEQ tablet TAKE ONE TABLET BY MOUTH ONCE DAILY  90 tablet  0  . pregabalin (LYRICA) 75 MG capsule Take 75 mg by mouth 2 (two) times daily.      . rosuvastatin (CRESTOR) 10 MG tablet Take 1 tablet (10 mg total) by mouth daily.  90 tablet  1  . senna-docusate (SENOKOT S) 8.6-50 MG per tablet Take 2 tablets by mouth at bedtime.        . tamoxifen (NOLVADEX) 20 MG tablet TAKE ONE TABLET EVERY DAY  90 tablet  1  . triamterene-hydrochlorothiazide (MAXZIDE-25) 37.5-25 MG per tablet Take 1/2 tablet by mouth daily  45 tablet  1   No  facility-administered encounter medications on file as of 12/21/2012.    No Known Allergies   Current Medications, Allergies, Past Medical History, Past Surgical History, Family History, and Social History were reviewed in [employer] record.    Review of Systems    Constitutional:  Denies F/C/S, anorexia, unexpected weight change. HEENT:  No HA, visual changes, earache, nasal symptoms, sore throat, hoarseness. Resp:  min cough & am sputum; no hemoptysis; no SOB, tightness,  wheezing. Cardio:  No CP, palpit, DOE, orthopnea, edema. GI:  Denies N/V/D/C or blood in stool; no reflux, abd pain, distention, or gas. GU:  No dysuria, freq, urgency, hematuria, or flank pain. MS:  +joint pain, no swelling or tenderness; decr ROM left shoulder; no neck pain, back pain, etc. Neuro:  No tremors, seizures, dizziness, syncope, weakness, numbness, gait abn. Skin:  No suspicious lesions or skin rash. Heme:  No adenopathy, bruising, bleeding. Psyche: Denies confusion, sleep disturbance, hallucinations, anxiety, depression.   Objective:   Physical Exam    WD, WN, 79 y/o BF in NAD... Vital Signs:  Reviewed...  General:  Alert & oriented; pleasant & cooperative... HEENT:  [place]/AT, EOM-wnl, PERRLA, EACs-clear, TMs-wnl, NOSE-clear, THROAT-clear & wnl. Neck:  Supple w/ fairROM; no JVD; normal carotid impulses w/o bruits; no thyromegaly or nodules palpated; no lymphadenopathy. Chest:  Clear to P & A; without wheezes/ rales/ or rhonchi heard... Heart:  Regular Rhythm; norm S1 & S2 without murmurs/ rubs/ or gallops detected... Abdomen:  Soft & nontender; normal bowel sounds; no organomegaly or masses palpated... Ext:  Normal ROM; without deformities or arthritic changes; no varicose veins, venous insuffic, or edema;  Pulses intact w/o bruits... Neuro:  CNs II-XII intact; motor testing normal; sensory testing normal; gait normal & balance OK... Derm:  No lesions noted; no rash etc... Lymph:  No cervical, supraclavicular, axillary, or inguinal adenopathy palpated...   RADIOLOGY DATA:  Reviewed in the EPIC EMR & discussed w/ the patient...    >>CXR 2/13 showed normal heart size, clear lungs, right humeral head prosthesis, mild DJD Tspine...  LABORATORY DATA:  Reviewed in the EPIC EMR & discussed w/ the patient...    >>LABS 2/13:  FLP- at goals on Cres10;  Chems- wnl;  CBC- wnl;  TSH=0.38 on Synth75...   Assessment & Plan:    HBP>  Controlled on meds, continue  same...  Cholesterol>  Continues on Crestor 10mg /d & FLP looks good on this dose...  Hypothyroid>  TSH still sl over suppressed but clinically euthyroid & she wants to continue same dose for now...   Constip>  Resolved w/ Miralax & Senakopt-S but still needs GI eval & colonoscopy, stool cards were neg...  GYN>  Per DrLomax...  Hx Breast Cancer>  Followed by DrMurinson on Tamoxifen...  DJD>  Followed by DrWhitfield & Marcial Pacas, as above...   Patient's Medications  New Prescriptions   No medications on file  Previous Medications   AMLODIPINE (NORVASC) 10 MG TABLET    TAKE ONE TABLET EACH DAY   LEVOTHYROXINE (SYNTHROID, LEVOTHROID) 75 MCG TABLET    Take 1 tablet (75 mcg total) by mouth daily.   PANTOPRAZOLE (PROTONIX) 40 MG TABLET    1 tablet by mouth 30 minutes before first meal of the day   POLYETHYLENE GLYCOL POWDER (GLYCOLAX/MIRALAX) POWDER    Take 17 g by mouth daily. 1 capful in water daily    POTASSIUM CHLORIDE (K-DUR) 10 MEQ TABLET    TAKE ONE TABLET BY MOUTH ONCE DAILY   PREGABALIN (LYRICA)  75 MG CAPSULE    Take 75 mg by mouth 2 (two) times daily.   ROSUVASTATIN (CRESTOR) 10 MG TABLET    Take 1 tablet (10 mg total) by mouth daily.   SENNA-DOCUSATE (SENOKOT S) 8.6-50 MG PER TABLET    Take 2 tablets by mouth at bedtime.     TAMOXIFEN (NOLVADEX) 20 MG TABLET    TAKE ONE TABLET EVERY DAY  Modified Medications   Modified Medication Previous Medication   TRIAMTERENE-HYDROCHLOROTHIAZIDE (MAXZIDE-25) 37.5-25 MG PER TABLET triamterene-hydrochlorothiazide (MAXZIDE-25) 37.5-25 MG per tablet      TAKE ONE-HALF TABLET DAILY    Take 1/2 tablet by mouth daily  Discontinued Medications   No medications on file

## 2012-12-21 NOTE — Patient Instructions (Addendum)
Today we updated your med list in our EPIC system...    Continue your current medications the same...  Please return to our lab one moprning this week for your FASTING blood work...    We will contact you w/ the results when avail...  We will arrange for a Neurology appt to evaluate your walking & "wobbly" gait...  Call for any questions...  Let's plan a follow up visit in 6 months.Marland KitchenMarland Kitchen

## 2012-12-31 ENCOUNTER — Other Ambulatory Visit: Payer: Self-pay | Admitting: Pulmonary Disease

## 2013-01-02 ENCOUNTER — Other Ambulatory Visit (INDEPENDENT_AMBULATORY_CARE_PROVIDER_SITE_OTHER): Payer: Medicare Other

## 2013-01-02 DIAGNOSIS — E7800 Pure hypercholesterolemia, unspecified: Secondary | ICD-10-CM

## 2013-01-02 LAB — LIPID PANEL: HDL: 36.2 mg/dL — ABNORMAL LOW (ref >39.00)

## 2013-01-21 ENCOUNTER — Other Ambulatory Visit: Payer: Self-pay | Admitting: Pulmonary Disease

## 2013-01-21 ENCOUNTER — Other Ambulatory Visit: Payer: Self-pay | Admitting: Oncology

## 2013-01-21 DIAGNOSIS — C50912 Malignant neoplasm of unspecified site of left female breast: Secondary | ICD-10-CM

## 2013-01-23 ENCOUNTER — Encounter: Payer: Self-pay | Admitting: Oncology

## 2013-02-13 ENCOUNTER — Telehealth: Payer: Self-pay | Admitting: Pulmonary Disease

## 2013-02-13 MED ORDER — METHYLPREDNISOLONE 4 MG PO KIT: PACK | ORAL | Status: DC

## 2013-02-13 NOTE — Telephone Encounter (Signed)
I spoke with pt and she c/o nasal congestion and dry cough x couple days. No f/c/s/n/c/chest tx/wheezing.increase SOB/PND. She is not taking anything OTC. Pt is requesting further recs. Please advise Dr. Lenna Gilford thanks Last OV 12/21/12 Pending 06/21/13 No Known Allergies

## 2013-02-13 NOTE — Telephone Encounter (Signed)
I spoke with pt and advised that SN is seeing pt's and has not answered the msg yet I advised that if she feels acutely ill should go to UC She declined and states that she will await SN's recs

## 2013-02-13 NOTE — Telephone Encounter (Signed)
Per SN---  Medrol dosepak  Nasal saline every 1-2 hours as needed mucinex 1-2 po bid with plenty of fluids Zyrtec 10 mg  Every morning  Called and spoke with pt and she is aware of SN recs and is aware that the medrol has been sent in to the pharmacy.

## 2013-02-13 NOTE — Telephone Encounter (Signed)
Calling in ref to previous msg can be reached at [phone].Jackie Horton

## 2013-02-22 ENCOUNTER — Other Ambulatory Visit: Payer: Self-pay | Admitting: Pulmonary Disease

## 2013-03-31 ENCOUNTER — Encounter: Payer: Self-pay | Admitting: Pulmonary Disease

## 2013-03-31 ENCOUNTER — Other Ambulatory Visit: Payer: Self-pay | Admitting: Pulmonary Disease

## 2013-05-26 ENCOUNTER — Telehealth: Payer: Self-pay | Admitting: Hematology and Oncology

## 2013-05-26 NOTE — Telephone Encounter (Signed)
S/w pt confirming appt for 8/22 @ 11am lb/CP1.

## 2013-06-07 ENCOUNTER — Telehealth: Payer: Self-pay | Admitting: Hematology and Oncology

## 2013-06-07 NOTE — Telephone Encounter (Signed)
, °

## 2013-06-16 ENCOUNTER — Ambulatory Visit: Payer: Medicare Other

## 2013-06-16 ENCOUNTER — Other Ambulatory Visit: Payer: Medicare Other | Admitting: Lab

## 2013-06-21 ENCOUNTER — Ambulatory Visit (INDEPENDENT_AMBULATORY_CARE_PROVIDER_SITE_OTHER)
Admission: RE | Admit: 2013-06-21 | Discharge: 2013-06-21 | Disposition: A | Discharge status: Home / Self Care | Payer: Medicare Other | Source: Ambulatory Visit | Attending: Pulmonary Disease | Admitting: Pulmonary Disease

## 2013-06-21 ENCOUNTER — Other Ambulatory Visit (INDEPENDENT_AMBULATORY_CARE_PROVIDER_SITE_OTHER): Payer: Medicare Other

## 2013-06-21 ENCOUNTER — Ambulatory Visit (INDEPENDENT_AMBULATORY_CARE_PROVIDER_SITE_OTHER): Payer: Medicare Other | Admitting: Pulmonary Disease

## 2013-06-21 ENCOUNTER — Encounter: Payer: Self-pay | Admitting: Pulmonary Disease

## 2013-06-21 VITALS — BP 128/80 | HR 85 | Temp 98.5°F | Ht 62.0 in | Wt 131.4 lb

## 2013-06-21 DIAGNOSIS — F17210 Nicotine dependence, cigarettes, uncomplicated: Secondary | ICD-10-CM

## 2013-06-21 DIAGNOSIS — R5381 Other malaise: Secondary | ICD-10-CM

## 2013-06-21 DIAGNOSIS — R531 Weakness: Secondary | ICD-10-CM

## 2013-06-21 DIAGNOSIS — M8580 Other specified disorders of bone density and structure, unspecified site: Secondary | ICD-10-CM

## 2013-06-21 DIAGNOSIS — C50912 Malignant neoplasm of unspecified site of left female breast: Secondary | ICD-10-CM

## 2013-06-21 DIAGNOSIS — E039 Hypothyroidism, unspecified: Secondary | ICD-10-CM

## 2013-06-21 DIAGNOSIS — I10 Essential (primary) hypertension: Secondary | ICD-10-CM

## 2013-06-21 DIAGNOSIS — C50919 Malignant neoplasm of unspecified site of unspecified female breast: Secondary | ICD-10-CM

## 2013-06-21 DIAGNOSIS — R269 Unspecified abnormalities of gait and mobility: Secondary | ICD-10-CM | POA: Insufficient documentation

## 2013-06-21 DIAGNOSIS — F17200 Nicotine dependence, unspecified, uncomplicated: Secondary | ICD-10-CM

## 2013-06-21 DIAGNOSIS — M1990 Unspecified osteoarthritis, unspecified site: Secondary | ICD-10-CM

## 2013-06-21 DIAGNOSIS — E7800 Pure hypercholesterolemia, unspecified: Secondary | ICD-10-CM

## 2013-06-21 LAB — CBC WITH DIFFERENTIAL/PLATELET
Basophils Absolute: 0 10*3/uL (ref 0.0–0.1)
Basophils Relative: 0.3 % (ref 0.0–3.0)
Eosinophils Absolute: 0.1 10*3/uL (ref 0.0–0.7)
Hemoglobin: 15.3 g/dL — ABNORMAL HIGH (ref 12.0–15.0)
Lymphocytes Relative: 43.9 % (ref 12.0–46.0)
MCHC: 33.5 g/dL (ref 30.0–36.0)
Monocytes Relative: 7.8 % (ref 3.0–12.0)
Neutrophils Relative %: 46.9 % (ref 43.0–77.0)
RBC: 4.99 Mil/uL (ref 3.87–5.11)
RDW: 14.4 % (ref 11.5–14.6)

## 2013-06-21 NOTE — Progress Notes (Signed)
Subjective:    Patient ID: Jackie Horton, female    DOB: [birth year], 79 y.o.   MRN: [id number]  HPI 79 y/o BF here to establish as a new patient 4/12> referred by her boyfriend [name], & her neighbor [name]; she has been a long time pt of DrGegick who is now retiring & we have requested his records to review;  Current problem list compiled by her memory, her medication list, & hosp [place] [facility] records;  She still works as a Actuary at [employer]...  ~  December 16, 2011:  23mo ROV & she reports increased LBP for which she saw DrNewton & was given Vicodin for prn use (she reports that it doesn't help that much)... She also notes some GERD, reflux symptoms & we discussed options for further eval by GI vs trial PPI therapy & she requests the latter- given Protonix40...    Cig Smoker> still smoking 1/4-1/2 ppd; denies resp symptoms other than mild chr AM cough; on no regular meds/ inhalers; CXR shows NAD; needs baseline PFTs...    HBP> on Amlod10, Maxzide-25 1/2 tab & K10; BP=120/76 & she is asymptomatic w/o CP, palpit, SOB, edema, etc...    Chol> on Cres10; FLP shows TChol 137, TG 126, HDL 44, LDL 68    Hypothy> on Synth75; prev TSH was oversuppressed but FreeT4 was wnl; TSH today= 0.38 & she is clinically euthyroid...    GERD> as noted- new symptom 2/13 but she declined GI eval & prefers therapeutic trial w/ Protonix40...    Constip> prev she went only once per week; now on Miralax & Senakot-S w/ better regularity...    Hx Breast Cancer> followed by DrMurinson on Tamoxifen; doing satis w/o known recurrence; she is due for a 92mo check up soon...    DJD> hx prev right shoulder surg by DrWhitfield & he sent her to [facility] for LBP w/ MRI done & shot given + Hydrocodone Rx... CXR 2/13 showed normal heart size, clear lungs, right humeral head prosthesis, mild DJD Tspine... LABS 2/13:  FLP- at goals on Cres10;  Chems- wnl;  CBC- wnl;  TSH=0.38 on Synth75...  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   it does not appear as though she ever received a Pneumonia shot in the past- so we will give her that today; she is prob also due for TDAP but we will wait til next OV... We reviewed prob list, meds, xrays and labs> see below>> CT & MRI CSpine by DrTooke 4/13 showed Cerv spondylosis & DDD w/ multilevel facet arthropathy & foraminal impingement...   ~  December 21, 2012:  14mo ROV & Jackie Horton is stable but has mult somatic complaints- gives out after dinner, feels like she wobbles- but denies dizziness, not falling etc & we discussed poss Neuro eval; notes neck 7 arm pain better on Lyrica... We reviewed the following medical problems during today's office visit >>     Cig Smoker> still smoking 1/4-1/2 ppd; denies resp symptoms other than mild chr AM cough; on no regular meds/ inhalers; CXR shows NAD; needs baseline PFTs, & must quit smoking but declines smoking cessation help...    HBP> on Amlod10, Maxzide-25 1/2 tab & K10; BP=120/76 &  she is asymptomatic w/o CP, palpit, SOB, edema, etc...    Chol> on Cres10; FLP shows TChol 92, TG 67, HDL 36, LDL 42... Ok to decr the Cres10 to 1/2 tab daily...    Hypothy> on Synth75; prev TSH was oversuppressed but FreeT4 was wnl; TSH 2/13 = 0.38 & she is clinically euthyroid...    GERD> as noted- new symptom 2/13 but she declined GI eval & prefers therapeutic trial w/ Protonix40...    Constip> prev she went only once per week; now on Miralax & Senakot-S w/ better regularity; she has never had a colonoscopy & refuses GI referral...     Hx Breast Cancer> followed by DrMurinson on Tamoxifen; doing satis w/o known recurrence; she was seen 8/13 & 2/14- notes reviewed...    DJD> hx prev right shoulder surg by DrWhitfield & he sent  her to [facility] for LBP w/ MRI done & shot given + Lyrica75Bid & Hydrocodone Rx... We reviewed prob list, meds, xrays and labs> see below for updates >> she had the 2013 Flu vaccine 9/13...  LABS 2/14:  FLP= at goals on Cres10 & ok to decr to 1/2 tab daily...   ~  June 21, 2013:  21mo ROV & Jackie Horton is still c/o a gait abn, notes some weakness 7 feels that she is "dragging" her left foot on occas although I cannot elicit any focal neuro findings on exam; she was supposed to see Neuro after the last OV but this did not get set up & she hasn't yet had the neuro eval- we will attempt to get this appt for her now... We reviewed the following medical problems during today's office visit >>     Cig Smoker> still smoking but down to 1ppwk; denies resp symptoms other than mild chr AM cough; on no regular meds/ inhalers; CXR shows NAD; needs baseline PFTs, & must quit smoking but declines smoking cessation help...    HBP> on Amlod10, Maxzide-25 1/2 tab & K10; BP=128/80 & she is asymptomatic w/o CP, palpit, SOB, edema, etc...    Chol> on Cres10; FLP 3/14 shows TChol 92, TG 67, HDL 36, LDL 42... Ok to decr the Cres10 to 1/2 tab daily...    Hypothy> on Synth75; prev TSH was oversuppressed but FreeT4 was wnl; TSH 8/14 = 0.88 & she is clinically euthyroid...    GERD> new symptom 2/13 but she declined GI eval & preferred therapeutic trial w/ Protonix40- improved & she stopped med on her own.    Constip> prev she went only once per week; now on Miralax & Senakot-S w/ better regularity; she has never had a colonoscopy & refuses GI referral...     Hx Breast Cancer> followed by DrMurinson on Tamoxifen20; doing satis w/o known recurrence; she was seen 8/13 & 2/14- notes reviewed...    DJD> hx prev right shoulder surg by DrWhitfield & he sent her to [facility] for LBP w/ MRI done & shot given + Lyrica75Bid & Hydrocodone Rx... We reviewed prob list, meds, xrays and labs> see below for updates >>  CXR 8/14 showed norm heart  size, clear lungs, sl scoliosis/ right shoulder prostesis/ DJD spine... LABS 8/14:  Chems- wnl;  CBC- wnl;  TSH=0.88;  VitD=56...           PROBLEM LIST:       Cig smoker>  She smokes 5-10 cig/d now;  29 yr hx smoking all <1ppd;  Denies hx lung dis but notes some AM cough/ sputum & SOB/ DOE.Marland KitchenMarland Kitchen ~  CXR 6/11 mild chr changes, clear/ NAD, right humeral head prosthesis... ~  CXR 2/13 showed stable heart shadow, clear lungs w/o acute dis, NAD.Marland Kitchen. ~  CXR 8/14 showed norm heart size, clear lungs, sl scoliosis/ right shoulder prostesis/ DJD spine     HBP>  On Amlodipine 10mg /d, Maxzide 1/2 tab daily, & KCl 47mEq daily;   ~  7/12:  BP= 136/82, tolerating meds well, not checking BP at home, and denies CP/ palpit/ dizzy/ SOB/ edema/ etc... ~  2/13:  On Amlod10 & Maxzide25- 1/2 tab> BP= 120/76 & she remains asymptomatic; BUN=18, Creat=0.7, K=4.0 on 3mEq KCl daily. ~  5/13:  BP= 138/70 on same meds& she remains asymptomatic... ~  2/14: on Amlod10, Maxzide-25 1/2 tab & K10; BP=120/76 & she is asymptomatic w/o CP, palpit, SOB, edema, etc. ~  8/14: on Amlod10, Maxzide-25 1/2 tab & K10; BP=128/80 & she is asymptomatic w/o CP, palpit, SOB, edema, etc.     Chol>  On Crestor 10mg  daily w/ good control of lipids she thinks, but we don't have labs to review yet & not fasting today;  We discussed getting old records & checking FLP on ret OV... ~  She has difficulty remembering to come to the office FASTING for blood work... ~  [place] 2/13 on Cres10 showed TChol 137, TG 126, HDL 44, LDL 68 ~  FLP 2/14 on Cres10 showed TChol 92, TG 67, HDL 36, LDL 42... OK to decr to 1/2 tab daily...     Hypothy>  Long hx hypothyroidism on replacement Rx w/ Synthroid 9mcg/d per DrGegick...  ~  Labs 4/12 revealed TSH= 0.09... over suppressed on 82mcg/d, Pharm indicates regular refills & just got 90d supply, continue same 7 recheck 16mo. ~  Labs 7/12 showed TSH= 0.20, FreeT4= 1.15 (0.6-1.60)... Clinically euthyroid & she prefers to  continue same dose for now. ~  Labs 2/13 showed TSH= 0.38 & she remains clinically euthyroid...     Constipation>  She tells me she goes only once per week & then only because she takes MOM;  She has NEVER HAD A COLONOSCOPY & is reluctant to consider this now;  We discussed Rx w/ Miralax & Senakot-S regularly, and referral to GI for constip eval & to consider the needed colonoscopy... ~  Labs 4/12 showed Hemoccults neg x6... She declined GI referral or eval... ~  BMs more regular on Miralax & Senakot-S; she continues to refuse referral to GI to discuss colonoscopy...     Urinary retention>  She had episode of U retention 6/11 several weeks after her AP repair surg, & eval in ER (EChart review)...     GYN>  S/p hysterectomy 15yrs ago & A/P repair for pelvic organ prolapse 6/11 by DrLomax (symptoms improved post surg);  DrLomax does her PAP, Mammograms, etc;  She had breast bx= fibroadenoma in 2011...     Breast Cancer>  S/P surg for left breast cancer, DCIS 5/09 by DrStreck;  She had axillary lymph node bx 3/10- neg, & needle bx left breat 4/11 that was neg (fibrosis);  Oncology eval & f/u by DrMurinson on Tamoxifen 20mg /d & he sees her Q96mo;  Last mammogram ~45mo ago at [facility] was neg... ~  She continues 48mo follow up visits w/ DrMurinson...     DJD>  She has mod DJD esp in L/S spine w/ scoliosis on XRays;  She had right shoulder hemiarthroplasty 7/10 by DrWhitfield (right humeral head prosthesis seen on CXR now) & she has limited shoulder motion on exam... ~  7/12:  She reports cortisone shot in right shoulder from DrWhitfield for pain... ~  4/13:  She had eval DrTooke for CSpine dis w/ neck/ right arm pain> CT/MRI Cspine 4/13 showed Cx spondylosis, anterolithesis, & DDD; she reports the discomfort resolved w/ Lyrica...     Osteop>  ? If she's had prev BMD at [facility], we will have to wait on old records to review;  She takes MVI, VitC, & VitD 1000u daily...     Health Maintenance>  She says  that she gets the yearly Flu vaccine, but has never had the Pneumonia shot, & she thinks that maybe she had a tetanus shot several yrs ago;  GYN= DrLomax;  GI= we will refer to [place] for eval constip & colonoscopy...   Past Surgical History  Procedure Date  . Partial hysterectomy 1972  . Breast lumpectomy 2009  . Right shoulder hemiarthroplasty per DrWhitfield...   . AP repair & bladder suspension by DrLomax 2011    Outpatient Encounter Prescriptions as of 06/21/2013  Medication Sig Dispense Refill  . amLODipine (NORVASC) 10 MG tablet TAKE ONE TABLET EACH DAY  90 tablet  3  . CRESTOR 10 MG tablet TAKE ONE TABLET BY MOUTH ONCE DAILY  90 tablet  3  . polyethylene glycol powder (GLYCOLAX/MIRALAX) powder Take 17 g by mouth daily. 1 capful in water daily       . potassium chloride (K-DUR) 10 MEQ tablet TAKE ONE TABLET BY MOUTH ONCE DAILY  90 tablet  2  . pregabalin (LYRICA) 75 MG capsule Take 75 mg by mouth 2 (two) times daily.      Marland Kitchen senna-docusate (SENOKOT S) 8.6-50 MG per tablet Take 2 tablets by mouth at bedtime.        Marland Kitchen SYNTHROID 75 MCG tablet TAKE ONE TABLET EACH DAY  90 tablet  3  . tamoxifen (NOLVADEX) 20 MG tablet TAKE ONE TABLET EACH DAY  90 tablet  1  . triamterene-hydrochlorothiazide (MAXZIDE-25) 37.5-25 MG per tablet TAKE ONE-HALF TABLET DAILY  45 tablet  11  . methylPREDNISolone (MEDROL, PAK,) 4 MG tablet follow package directions  21 tablet  0  . [DISCONTINUED] pantoprazole (PROTONIX) 40 MG tablet 1 tablet by mouth 30 minutes before first meal of the day  30 tablet  5  . [DISCONTINUED] rosuvastatin (CRESTOR) 10 MG tablet Take 1 tablet (10 mg total) by mouth daily.  90 tablet  1   No facility-administered encounter medications on file as of 06/21/2013.    No Known Allergies   Current Medications, Allergies, Past Medical History, Past Surgical History, Family History, and Social History were reviewed in [employer] record.    Review of Systems     Constitutional:  Denies F/C/S, anorexia, unexpected weight change. HEENT:  No HA, visual changes, earache, nasal symptoms, sore throat, hoarseness. Resp:  min cough & am sputum; no hemoptysis; no SOB, tightness, wheezing. Cardio:  No CP, palpit, DOE, orthopnea, edema. GI:  Denies N/V/D/C or blood in stool; no reflux, abd pain, distention, or gas. GU:  No dysuria, freq, urgency, hematuria, or flank pain. MS:  +joint pain, no swelling or tenderness; decr ROM left shoulder; no neck pain, back pain, etc. Neuro:  No tremors, seizures, dizziness, syncope, weakness, numbness, gait abn. Skin:  No suspicious lesions or skin rash. Heme:  No adenopathy, bruising, bleeding. Psyche: Denies confusion, sleep disturbance, hallucinations, anxiety, depression.   Objective:   Physical Exam    WD, WN, 79 y/o BF in NAD... Vital  Signs:  Reviewed...  General:  Alert & oriented; pleasant & cooperative... HEENT:  [place]/AT, EOM-wnl, PERRLA, EACs-clear, TMs-wnl, NOSE-clear, THROAT-clear & wnl. Neck:  Supple w/ fairROM; no JVD; normal carotid impulses w/o bruits; no thyromegaly or nodules palpated; no lymphadenopathy. Chest:  Clear to P & A; without wheezes/ rales/ or rhonchi heard... Heart:  Regular Rhythm; norm S1 & S2 without murmurs/ rubs/ or gallops detected... Abdomen:  Soft & nontender; normal bowel sounds; no organomegaly or masses palpated... Ext:  Normal ROM; without deformities or arthritic changes; no varicose veins, venous insuffic, or edema;  Pulses intact w/o bruits... Neuro:  CNs II-XII intact; motor testing normal; sensory testing normal; gait normal & balance OK... Derm:  No lesions noted; no rash etc... Lymph:  No cervical, supraclavicular, axillary, or inguinal adenopathy palpated...   RADIOLOGY DATA:  Reviewed in the EPIC EMR & discussed w/ the patient...     LABORATORY DATA:  Reviewed in the EPIC EMR & discussed w/ the patient...     Assessment & Plan:    HBP>  Controlled on meds,  continue same...  Cholesterol>  Continues on Crestor 10mg /d & FLP looks good on this dose...  Hypothyroid>  TSH low end of norm but clinically euthyroid & she wants to continue same dose for now...   Constip>  Resolved w/ Miralax & Senakopt-S but still needs GI eval & colonoscopy, stool cards were neg...  GYN>  Per DrLomax...  Hx Breast Cancer>  Followed by DrMurinson on Tamoxifen...  DJD>  Followed by DrWhitfield & Marcial Pacas, as above...   Patient's Medications  New Prescriptions   No medications on file  Previous Medications   AMLODIPINE (NORVASC) 10 MG TABLET    TAKE ONE TABLET EACH DAY   CRESTOR 10 MG TABLET    TAKE ONE TABLET BY MOUTH ONCE DAILY   METHYLPREDNISOLONE (MEDROL, PAK,) 4 MG TABLET    follow package directions   POLYETHYLENE GLYCOL POWDER (GLYCOLAX/MIRALAX) POWDER    Take 17 g by mouth daily. 1 capful in water daily    POTASSIUM CHLORIDE (K-DUR) 10 MEQ TABLET    TAKE ONE TABLET BY MOUTH ONCE DAILY   PREGABALIN (LYRICA) 75 MG CAPSULE    Take 75 mg by mouth 2 (two) times daily.   SENNA-DOCUSATE (SENOKOT S) 8.6-50 MG PER TABLET    Take 2 tablets by mouth at bedtime.     SYNTHROID 75 MCG TABLET    TAKE ONE TABLET EACH DAY   TAMOXIFEN (NOLVADEX) 20 MG TABLET    TAKE ONE TABLET EACH DAY   TRIAMTERENE-HYDROCHLOROTHIAZIDE (MAXZIDE-25) 37.5-25 MG PER TABLET    TAKE ONE-HALF TABLET DAILY  Modified Medications   No medications on file  Discontinued Medications   PANTOPRAZOLE (PROTONIX) 40 MG TABLET    1 tablet by mouth 30 minutes before first meal of the day   ROSUVASTATIN (CRESTOR) 10 MG TABLET    Take 1 tablet (10 mg total) by mouth daily.

## 2013-06-21 NOTE — Patient Instructions (Addendum)
Today we updated your med list in our EPIC system...    Continue your current medications the same...  Add on a good WOMEN's FORMULA Multivitamin daily...  Today we did your follow up l;abs and a CXR...    We will contact you w/ the results when available...   We will arrange for a NEUROLOGY appt for a thorough eval of your walking difficulty...  Call for any questions...  Let's plan a routine follow up visit in 28mo, sooner if needed for problems.Marland KitchenMarland Kitchen

## 2013-06-22 LAB — BASIC METABOLIC PANEL
BUN: 13 mg/dL (ref 6–23)
CO2: 26 mEq/L (ref 19–32)
Calcium: 10.1 mg/dL (ref 8.4–10.5)
Creatinine, Ser: 0.7 mg/dL (ref 0.4–1.2)
Glucose, Bld: 80 mg/dL (ref 70–99)

## 2013-06-22 LAB — VITAMIN D 25 HYDROXY (VIT D DEFICIENCY, FRACTURES): Vit D, 25-Hydroxy: 56 ng/mL (ref 30–89)

## 2013-07-05 ENCOUNTER — Telehealth: Payer: Self-pay | Admitting: Hematology and Oncology

## 2013-07-05 NOTE — Telephone Encounter (Signed)
Called pt re r/s 80mth f/u on 9/12 to October.  pt states she is having some breast soreness and heaviness and will have mammo 9/17. Moved 9/12 appt to 9/22 - pt aware of new d/t. Alerted DM desk nurse of this breast soreness/heaviness.

## 2013-07-06 ENCOUNTER — Ambulatory Visit (INDEPENDENT_AMBULATORY_CARE_PROVIDER_SITE_OTHER): Payer: Medicare Other | Admitting: Neurology

## 2013-07-06 ENCOUNTER — Encounter: Payer: Self-pay | Admitting: Neurology

## 2013-07-06 VITALS — BP 132/86 | HR 94 | Ht 62.0 in | Wt 131.0 lb

## 2013-07-06 DIAGNOSIS — R531 Weakness: Secondary | ICD-10-CM

## 2013-07-06 DIAGNOSIS — G609 Hereditary and idiopathic neuropathy, unspecified: Secondary | ICD-10-CM

## 2013-07-06 DIAGNOSIS — M6281 Muscle weakness (generalized): Secondary | ICD-10-CM

## 2013-07-06 NOTE — Progress Notes (Addendum)
[facility]  Provider:  Dr Janann Colonel Referring Provider: Noralee Space, MD Primary Care Physician:  Noralee Space, MD  CC: abnormal gait  HPI:  Jackie Horton is a 80 y.o. female here as a referral from Dr. Lenna Gilford for abnormal gait  Notes gait difficulty started around 6 months ago and she feels has gotten progressively worse since then. She feels it came on suddenly. Describes difficulty using her left leg, feels that she is dragging it more and more. Denies any dizziness, no vertigo no lightheaded sensation. No lightheaded sensation upon standing or changing positions. Also feels she is not using her left upper extremity as well. Denies any falls. Has a history of lumbar back pain, reports having had a injection for the pain in the past. At night she also notes a burning sensation in her feet. Denies any difficulty with a temperature or position sense in her feet. Notes overall having low energy. Has history of breast cancer and has been on tamoxifen for around 5 years. Is followed by oncology for this. Has history of chronic right shoulder pain do to rotator cuff injury, some Lyrica for this. Denies any changes of bowel bladder control. No weakness in right lower extremity.   Review of Systems: Out of a complete 14 system review, the patient complains of only the following symptoms, and all other reviewed systems are negative. Other for fatigue decreased energy  History   Social History  . Marital Status: Divorced    Spouse Name: N/A    Number of Children: 3  . Years of Education: 11   Occupational History  . caregiver     works 3 days week at [employer]  . Smoking status: Current Some Day Smoker -- 0.25 packs/day    Types: Cigarettes  . Smokeless tobacco: Never Used  . Alcohol Use: No  . Drug Use: No  . Sexual Activity: Not on file   Other Topics Concern  . Not on file   Social History Narrative   2 sisters with breast  cancer   Friend--Howard Burch    Family History  Problem Relation Age of Onset  . Breast cancer Daughter   . Diabetes Daughter   . Breast cancer Sister     3 sisters    Past Medical History  Diagnosis Date  . Breast cancer   . Hypertension   . Hypercholesteremia   . Hypothyroid   . Cigarette smoker   . Osteoarthritis   . Constipation     Past Surgical History  Procedure Laterality Date  . Partial hysterectomy  1972  . Breast lumpectomy  2009  . Rotator cuff repair    . Bladder tact  2011    Current Outpatient Prescriptions  Medication Sig Dispense Refill  . amLODipine (NORVASC) 10 MG tablet TAKE ONE TABLET EACH DAY  90 tablet  3  . CRESTOR 10 MG tablet TAKE ONE TABLET BY MOUTH ONCE DAILY  90 tablet  3  . methylPREDNISolone (MEDROL, PAK,) 4 MG tablet follow package directions  21 tablet  0  . polyethylene glycol powder (GLYCOLAX/MIRALAX) powder Take 17 g by mouth daily. 1 capful in water daily       . potassium chloride (K-DUR) 10 MEQ tablet TAKE ONE TABLET BY MOUTH ONCE DAILY  90 tablet  2  . pregabalin (LYRICA) 75 MG capsule Take 75 mg by mouth 2 (two) times daily.      . sennosides-docusate sodium (SENOKOT-S) 8.6-50  MG tablet Take 1 tablet by mouth daily.      Marland Kitchen SYNTHROID 75 MCG tablet TAKE ONE TABLET EACH DAY  90 tablet  3  . tamoxifen (NOLVADEX) 20 MG tablet TAKE ONE TABLET EACH DAY  90 tablet  1  . triamterene-hydrochlorothiazide (MAXZIDE-25) 37.5-25 MG per tablet TAKE ONE-HALF TABLET DAILY  45 tablet  11   No current facility-administered medications for this visit.    Allergies as of 07/06/2013  . (No Known Allergies)    Vitals: BP 132/86  Pulse 94  Ht 5\' 2"  (1.575 m)  Wt 131 lb (59.421 kg)  BMI 23.95 kg/m2 Last Weight:  Wt Readings from Last 1 Encounters:  07/06/13 131 lb (59.421 kg)   Last Height:   Ht Readings from Last 1 Encounters:  07/06/13 5\' 2"  (1.575 m)     Physical exam: Exam: Gen: NAD, conversant Eyes: anicteric sclerae, moist  conjunctivae HENT: Atraumatic Neck: Trachea midline; supple,  Lungs: CTA, no wheezing, rales, rhonic                          CV: RRR, no MRG Abdomen: Soft, non-tender;  Extremities: No peripheral edema  Skin: Normal temperature, no rash,  Psych: Appropriate affect, pleasant  Neuro: Jackie: AA&Ox3, appropriately interactive, normal affect   Speech: fluent w/o paraphasic error  Memory: good recent and remote recall  CN: PERRL, EOMI no nystagmus, no ptosis, sensation intact to LT V1-V3 bilat, face symmetric, no weakness, hearing grossly intact, palate elevates symmetrically, shoulder shrug 5/5 bilat,  tongue protrudes midline, no fasiculations noted.  Motor: normal bulk and tone Strength:  limited ROM RUE due to rotator cuff injury,RUE distal strength 5/5, Proximal LUE 5-/5, distal 5/5, proximal LLE 4+/5, distal 5/5  Coord: rapid alternating and point-to-point (FNF, HTS) movements intact.  Reflexes: symmetrical, bilat downgoing toes  Sens: diminished LT, PP vibration LLE distal,   Gait: posture, stance, stride and arm-swing normal. Tandem gait intact. Able to walk on heels and toes. Romberg absent.   Assessment:  After physical and neurologic examination, review of laboratory studies, imaging, neurophysiology testing and pre-existing records, assessment will be reviewed on the problem list.  Plan:  Treatment plan and additional workup will be reviewed under Problem List.  Jackie Horton is a pleasant 79 year old woman who presents for initial evaluation of progressive gait instability. She attributes these problems to weakness on her left side and feels that she is dragging her left leg. Physical exam is pertinent for some sensory changes consistent with neuropathy, and some weakness of the proximal muscles on the left side lower showed a greater than upper. Her history is pertinent for chronic lower back pain and history of breast cancer with use of tamoxifen. With some mild left-sided  weakness and patient report acute onset of symptoms would have concern for possible stroke, though exam is not fully consistent with this. Patient also has multiple risk factors for peripheral neuropathy. Would also consider a lumbar radiculopathy affecting left side.  1)gait instability 2)weakness 3)peripheral neuropathy  -MRI brain -lab workup for peripheral neuropathy -continue Lyrica for symptomatic relief -would consider MRI L spine or EMG/NCS in the future -discussed PT referral with patient and she wishes to hold off at this time

## 2013-07-06 NOTE — Patient Instructions (Addendum)
Overall you are doing fairly well but I do want to suggest a few things today:   Remember to drink plenty of fluid, eat healthy meals and do not skip any meals. Try to eat protein with a every meal and eat a healthy snack such as fruit or nuts in between meals. Try to keep a regular sleep-wake schedule and try to exercise daily, particularly in the form of walking, 20-30 minutes a day, if you can.   Continue the Lyrica for symptomatic relief of your neuropathy  As far as diagnostic testing: I would like to do a MRI of the brain and check some blood work today  I would like to see you back in 3 to 4 months, sooner if we need to. Please call us with any interim questions, concerns, problems, updates or refill requests.   Please also call us for any test results so we can go over those with you on the phone.  My clinical assistant and will answer any of your questions and relay your messages to me and also relay most of my messages to you.   Our phone number is [phone]. We also have an after hours call service for urgent matters and there is a physician on-call for urgent questions. For any emergencies you know to call 911 or go to the nearest emergency room

## 2013-07-07 ENCOUNTER — Other Ambulatory Visit: Payer: Medicare Other | Admitting: Lab

## 2013-07-07 ENCOUNTER — Ambulatory Visit: Payer: Medicare Other

## 2013-07-11 ENCOUNTER — Ambulatory Visit
Admission: RE | Admit: 2013-07-11 | Discharge: 2013-07-11 | Disposition: A | Discharge status: Home / Self Care | Payer: Medicare Other | Source: Ambulatory Visit | Attending: Neurology | Admitting: Neurology

## 2013-07-11 DIAGNOSIS — R531 Weakness: Secondary | ICD-10-CM

## 2013-07-11 DIAGNOSIS — M6281 Muscle weakness (generalized): Secondary | ICD-10-CM

## 2013-07-11 LAB — VITAMIN B12: Vitamin B-12: 610 pg/mL (ref 211–946)

## 2013-07-11 LAB — TSH: TSH: 0.971 u[IU]/mL (ref 0.450–4.500)

## 2013-07-14 ENCOUNTER — Telehealth: Payer: Self-pay | Admitting: Hematology and Oncology

## 2013-07-14 ENCOUNTER — Other Ambulatory Visit: Payer: Self-pay | Admitting: Oncology

## 2013-07-14 DIAGNOSIS — C50919 Malignant neoplasm of unspecified site of unspecified female breast: Secondary | ICD-10-CM

## 2013-07-14 NOTE — Progress Notes (Signed)
Quick Note:  I called pt and gave her the results of her labs. I did relay the HbgA1c result to her and will forward to Dr. Lenna Gilford. She also asked about MRI results. They are back, will forward to Dr. Janann Colonel. ______

## 2013-07-14 NOTE — Telephone Encounter (Signed)
Moved pt from NG to [facility] (APP) due to time slot needed for urgent pt. Ok w/APP per Chief Strategy Officer. S/w pt she is aware.

## 2013-07-17 ENCOUNTER — Other Ambulatory Visit (HOSPITAL_BASED_OUTPATIENT_CLINIC_OR_DEPARTMENT_OTHER): Payer: Medicare Other | Admitting: Lab

## 2013-07-17 ENCOUNTER — Encounter: Payer: Self-pay | Admitting: Family

## 2013-07-17 ENCOUNTER — Telehealth: Payer: Self-pay | Admitting: Pulmonary Disease

## 2013-07-17 ENCOUNTER — Ambulatory Visit (HOSPITAL_BASED_OUTPATIENT_CLINIC_OR_DEPARTMENT_OTHER): Payer: Medicare Other | Admitting: Family

## 2013-07-17 VITALS — BP 133/83 | HR 80 | Temp 98.4°F | Resp 20 | Ht 62.0 in | Wt 130.5 lb

## 2013-07-17 DIAGNOSIS — D0590 Unspecified type of carcinoma in situ of unspecified breast: Secondary | ICD-10-CM

## 2013-07-17 DIAGNOSIS — C50919 Malignant neoplasm of unspecified site of unspecified female breast: Secondary | ICD-10-CM

## 2013-07-17 DIAGNOSIS — Z853 Personal history of malignant neoplasm of breast: Secondary | ICD-10-CM

## 2013-07-17 LAB — CBC WITH DIFFERENTIAL/PLATELET
Basophils Absolute: 0.1 10*3/uL (ref 0.0–0.1)
Eosinophils Absolute: 0.1 10*3/uL (ref 0.0–0.5)
HGB: 15 g/dL (ref 11.6–15.9)
MCV: 91.3 fL (ref 79.5–101.0)
MONO%: 7.1 % (ref 0.0–14.0)
NEUT#: 4.7 10*3/uL (ref 1.5–6.5)
RDW: 13.8 % (ref 11.2–14.5)
lymph#: 3.9 10*3/uL — ABNORMAL HIGH (ref 0.9–3.3)

## 2013-07-17 LAB — COMPREHENSIVE METABOLIC PANEL (CC13)
Albumin: 4.3 g/dL (ref 3.5–5.0)
CO2: 26 mEq/L (ref 22–29)
Calcium: 10.8 mg/dL — ABNORMAL HIGH (ref 8.4–10.4)
Glucose: 99 mg/dl (ref 70–140)
Potassium: 3.6 mEq/L (ref 3.5–5.1)
Sodium: 142 mEq/L (ref 136–145)
Total Protein: 7.8 g/dL (ref 6.4–8.3)

## 2013-07-17 NOTE — Patient Instructions (Addendum)
Please contact us at [phone] if you have any questions or concerns. Your oncologist is Heath Lark, MD.  Please continue to do well and enjoy life!!!  Get plenty of rest, drink plenty of water, exercise daily, eat a balanced diet.  Take multivitamin every other day from October 1st - October 31st.  Then start taking multivitamin daily in month of November.  Complete monthly self-breast examinations.  Have a clinical breast exam by a physician every year.  Have your mammogram completed every year.  Results for orders placed in visit on 07/17/13 (from the past 24 hour(s))  CBC WITH DIFFERENTIAL     Status: Abnormal   Collection Time    07/17/13  1:28 PM      Result Value Range   WBC 9.5  3.9 - 10.3 10e3/uL   NEUT# 4.7  1.5 - 6.5 10e3/uL   HGB 15.0  11.6 - 15.9 g/dL   HCT 44.3  34.8 - 46.6 %   Platelets 194  145 - 400 10e3/uL   MCV 91.3  79.5 - 101.0 fL   MCH 31.0  25.1 - 34.0 pg   MCHC 34.0  31.5 - 36.0 g/dL   RBC 4.85  3.70 - 5.45 10e6/uL   RDW 13.8  11.2 - 14.5 %   lymph# 3.9 (*) 0.9 - 3.3 10e3/uL   MONO# 0.7  0.1 - 0.9 10e3/uL   Eosinophils Absolute 0.1  0.0 - 0.5 10e3/uL   Basophils Absolute 0.1  0.0 - 0.1 10e3/uL   NEUT% 49.2  38.4 - 76.8 %   LYMPH% 41.4  14.0 - 49.7 %   MONO% 7.1  0.0 - 14.0 %   EOS% 1.2  0.0 - 7.0 %   BASO% 1.1  0.0 - 2.0 %   Narrative:    Performed At:  [facility].               [place], [place] [address]  LACTATE DEHYDROGENASE (CC13)     Status: None   Collection Time    07/17/13  1:28 PM      Result Value Range   LDH 222  125 - 245 U/L   Narrative:    Performed At:  [facility].               [place], [place] [address]  COMPREHENSIVE METABOLIC PANEL (0000000)     Status: Abnormal   Collection Time    07/17/13  1:28 PM      Result Value Range   Sodium 142  136 - 145 mEq/L   Potassium 3.6  3.5 - 5.1 mEq/L   Chloride 107  98 - 109 mEq/L   CO2 26  22 - 29 mEq/L   Glucose 99  70 - 140 mg/dl   BUN 12.4  7.0 - 26.0 mg/dL   Creatinine 0.8  0.6 - 1.1 mg/dL   Total Bilirubin 0.50  0.20 - 1.20 mg/dL   Alkaline Phosphatase 74  40 - 150 U/L   AST 35 (*) 5 - 34 U/L   ALT 37  0 - 55 U/L   Total Protein 7.8  6.4 - 8.3 g/dL   Albumin 4.3  3.5 - 5.0 g/dL   Calcium 10.8 (*) 8.4 - 10.4 mg/dL   Narrative:  Note: New Reference ranges.Performed At:  [facility].               [place], [place] [address]

## 2013-07-17 NOTE — Telephone Encounter (Signed)
I spoke with pt. 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 Janann Colonel and she was not fasting on 07/06/13. Her A1C came back abnormal and was advised to call out office. I have printed this off with phone note. Please advise SN thanks Last OV 06/21/13 Pending 12/26/12 No Known Allergies

## 2013-07-17 NOTE — Progress Notes (Addendum)
[place]  [phone] [phone]  OFFICE PROGRESS NOTE   Patient ID: Jackie Horton, female   DOB: [birth year], 79 y.o.  [id number]      MI:[id number]  PCP: Noralee Space, MD   Diagnosis: Jackie Horton is a 79 y.o. female with a history of ductal carcinoma in situ of the left breast diagnosed in 02/2008.  Prior Therapy: 1.  Ductal carcinoma in situ of the left breast, upper outer quadrant, 1-cm lesion, intermediate grade with an initially negative biopsy  on 12/28/2007, followed by needle localization on 02/28/2008. Estrogen receptor and progesterone receptor were both 100%.   2.  The patient underwent radiation treatments to the left breast, receiving 6040 cGy from 04/02/2008 through 05/17/2008.   3.  Antiestrogen therapy with Tamoxifen was started in 04/2008  4.  A left axillary lymph node was biopsied on 12/28/2008 and was negative for metastatic disease.    Current Therapy:  Tamoxifen 20 mg by mouth daily.   Interval History:   Dr. Alvy Bimler and I saw [name] today for followup of ductal carcinoma in situ of the left breast.  She was last seen by Dr. Ralene Ok on 06/16/2012.  Since her last office visits, she has complaints of left breast heaviness and pain that has been intermittent  in nature x 2 months.  She also has complaints of chronic constipation.  She is establishing herself with Dr. Calton Dach service today.  Medical History: Past Medical History  Diagnosis Date  . Breast cancer   . Hypertension   . Hypercholesteremia   . Hypothyroid   . Cigarette smoker   . Osteoarthritis   . Constipation     Surgical History: Past Surgical History  Procedure Laterality Date  . Partial hysterectomy  1972  . Breast lumpectomy  2009  . Rotator cuff repair    . Bladder tact  2011    Family History: Family History  Problem Relation Age of Onset  . Breast cancer Daughter   . Diabetes Daughter   . Breast cancer  Sister     3 sisters  . Aneurysm Father     Social History: History  Substance Use Topics  . Smoking status: Current Some Day Smoker -- 0.25 packs/day    Types: Cigarettes  . Smokeless tobacco: Never Used  . Alcohol Use: No    Allergies: No Known Allergies  Current Medications: Current Outpatient Prescriptions  Medication Sig Dispense Refill  . amLODipine (NORVASC) 10 MG tablet TAKE ONE TABLET EACH DAY  90 tablet  3  . aspirin 81 MG tablet Take 81 mg by mouth daily.      . CRESTOR 10 MG tablet TAKE ONE TABLET BY MOUTH ONCE DAILY  90 tablet  3  . magnesium hydroxide (MILK OF MAGNESIA) 400 MG/5ML suspension Take 15 mLs by mouth daily as needed for constipation.      . Multiple Vitamins-Minerals (CENTRUM SILVER ULTRA WOMENS) TABS Take 1 tablet by mouth daily.      . potassium chloride (K-DUR) 10 MEQ tablet TAKE ONE TABLET BY MOUTH ONCE DAILY  90 tablet  2  . pregabalin (LYRICA) 75 MG capsule Take 75 mg by mouth 2 (two) times daily.      . sennosides-docusate sodium (SENOKOT-S) 8.6-50 MG tablet Take 1 tablet by mouth daily.      Marland Kitchen SYNTHROID 75 MCG tablet TAKE ONE TABLET EACH DAY  90 tablet  3  . tamoxifen (NOLVADEX) 20 MG tablet TAKE ONE TABLET EACH  DAY  90 tablet  0  . triamterene-hydrochlorothiazide (MAXZIDE-25) 37.5-25 MG per tablet TAKE ONE-HALF TABLET DAILY  45 tablet  11   No current facility-administered medications for this visit.     Review of Systems: 10 Point review of systems was completed and is negative except as noted above.  Jackie Horton denies any symptomatology including fatigue, fever or chills, headache, vision changes, swollen glands, cough or shortness of breath, chest pain or discomfort, nausea, vomiting, diarrhea, constipation, change in urinary or bowel habits, arthralgias/myalgias, unusual bleeding/bruising or any other symptomatology.   ECOG Performance Status: ECOG FS: 1 - Symptomatic but completely ambulatory   Physical Exam:   Blood pressure 133/83,  pulse 80, temperature 98.4 F (36.9 C), temperature source Oral, resp. rate 20, height 5\' 2"  (1.575 m), weight 130 lb 8 oz (59.194 kg).  General appearance: Alert, cooperative, well nourished, no apparent distress Head: Normocephalic, with scalp lesion being followed by her primary care physician, atraumatic, dentures Eyes: Arcus senilis, PERRLA, EOMI Nose: Nares, septum and mucosa are normal, no drainage or sinus tenderness Neck: No adenopathy, supple, symmetrical, trachea midline, no tenderness Resp: Clear to auscultation bilaterally, no wheezes/rales/rhonchi Cardio: Regular rate and rhythm, S1, S2 normal, no murmur, click, rub or gallop, no edema Breasts:  Pendulous breasts bilaterally, left breast has a well-healed surgical scar, no nipple inversion, glandular breast tissue bilaterally, bilateral axillary fullness GI: Soft, distended, non-tender, hypoactive bowel sounds, no organomegaly Skin: Scalp lesion, no rashes, skin warm and dry, no erythematous areas, no cyanosis, bilateral lower extremity hyperpigmentation  M/S:  Atraumatic, normal strength in all extremities, normal range of motion, no clubbing  Lymph nodes: Cervical, supraclavicular, and axillary nodes normal Neurologic: Grossly normal, cranial nerves II through XII intact, alert and oriented x 3 Psych: Appropriate affect   Laboratory Data: Results for orders placed in visit on 07/17/13 (from the past 48 hour(s))  CBC WITH DIFFERENTIAL     Status: Abnormal   Collection Time    07/17/13  1:28 PM      Result Value Range   WBC 9.5  3.9 - 10.3 10e3/uL   NEUT# 4.7  1.5 - 6.5 10e3/uL   HGB 15.0  11.6 - 15.9 g/dL   HCT 44.3  34.8 - 46.6 %   Platelets 194  145 - 400 10e3/uL   MCV 91.3  79.5 - 101.0 fL   MCH 31.0  25.1 - 34.0 pg   MCHC 34.0  31.5 - 36.0 g/dL   RBC 4.85  3.70 - 5.45 10e6/uL   RDW 13.8  11.2 - 14.5 %   lymph# 3.9 (*) 0.9 - 3.3 10e3/uL   MONO# 0.7  0.1 - 0.9 10e3/uL   Eosinophils Absolute 0.1  0.0 - 0.5 10e3/uL    Basophils Absolute 0.1  0.0 - 0.1 10e3/uL   NEUT% 49.2  38.4 - 76.8 %   LYMPH% 41.4  14.0 - 49.7 %   MONO% 7.1  0.0 - 14.0 %   EOS% 1.2  0.0 - 7.0 %   BASO% 1.1  0.0 - 2.0 %  LACTATE DEHYDROGENASE (CC13)     Status: None   Collection Time    07/17/13  1:28 PM      Result Value Range   LDH 222  125 - 245 U/L  COMPREHENSIVE METABOLIC PANEL (0000000)     Status: Abnormal   Collection Time    07/17/13  1:28 PM      Result Value Range   Sodium 142  136 -  145 mEq/L   Potassium 3.6  3.5 - 5.1 mEq/L   Chloride 107  98 - 109 mEq/L   CO2 26  22 - 29 mEq/L   Glucose 99  70 - 140 mg/dl   BUN 12.4  7.0 - 26.0 mg/dL   Creatinine 0.8  0.6 - 1.1 mg/dL   Total Bilirubin 0.50  0.20 - 1.20 mg/dL   Alkaline Phosphatase 74  40 - 150 U/L   AST 35 (*) 5 - 34 U/L   ALT 37  0 - 55 U/L   Total Protein 7.8  6.4 - 8.3 g/dL   Albumin 4.3  3.5 - 5.0 g/dL   Calcium 10.8 (*) 8.4 - 10.4 mg/dL     Imaging Studies: 1.  Dg Chest 2 View 06/21/2013   *RADIOLOGY REPORT*  Clinical Data: Weakness, smoker, hypertension  CHEST - 2 VIEW  Comparison: 12/16/2011  Findings: Cardiomediastinal silhouette is stable.  Mild upper thoracic dextroscoliosis. Minimal lower thoracic levoscoliosis.  Right shoulder prosthesis again noted.  No acute infiltrate or pleural effusion.  No pulmonary edema. Mild degenerative changes mid thoracic spine.  IMPRESSION: No active disease. No significant change.   Original Report Authenticated By: Lahoma Crocker, M.D.   2.  Mr Brain Wo Contrast 07/12/2013   [facility]  NEUROIMAGING REPORT   STUDY DATE: 07/11/13 PATIENT NAME: ADRINNE CUELLAR DOB: 07-Apr-1933 MRN: [id number]  ORDERING CLINICIAN: Jim Like, DO CLINICAL HISTORY: 79 year old female with left sided weakness.  EXAM: MRI brain (without)  TECHNIQUE: MRI of the brain without contrast was obtained utilizing 5 mm  axial slices with T1, T2, T2 flair, SWI and diffusion weighted views.  T1  sagittal and T2 coronal views were obtained.  CONTRAST: no IMAGING SITE: [employer] (3 Tesla MRI)   FINDINGS:  No abnormal lesions are seen on diffusion-weighted views to suggest acute  ischemia. The cortical sulci, fissures and cisterns are notable for mild  right and moderate left perisylvian atrophy. Left lateral ventricle  slightly enlarged compared to the right side. Dense left greater than  right globus pallidi T2 and SWI hypointensities, consistent with mineral  or hemosiderin deposition. Few scattered periventricular and subcortical  foci of non-specific gliosis.  No extra-axial fluid collections are seen. No evidence of mass effect or  midline shift.    On sagittal views the posterior fossa, pituitary gland and corpus callosum  are unremarkable. No evidence of intracranial hemorrhage on SWI views.   The orbits and their contents, paranasal sinuses and calvarium are  unremarkable.  Intracranial flow voids are present.  07/12/2013   Abnormal MRI brain (without) demonstrating: 1. Mild right and moderate left perisylvian atrophy. 2. Dense mineralization vs hemosiderin deposition in the left greater than  right globus pallidi. 3. No acute findings.   INTERPRETING PHYSICIAN:  Penni Bombard, MD Certified in Neurology, Neurophysiology and Neuroimaging  [facility] [address] [place], [place] [address] [phone]   3.  The patient had a unilateral right digital diagnostic mammogram on 07/12/2013 at [facility] which showed the patient presented for 6 month followup of asymmetry in the right breast.  There is a benign density seen only on CC view unchanged from previous exam and an intramammary node in the right breast.  No significant masses, calcifications, or other findings are seen in the breast.  There has been no significant interval change.  There is no mammographic evidence of malignancy.    Assessment/Plan: 1.  History of DCIS of the left breast - The patient wishes to continue  antiestrogen therapy with Tamoxifen and Dr. Alvy Bimler agreed to continue antiestrogen therapy.  The patient states she does not need a prescription for Tamoxifen at this time.  The possible side effects of tamoxifen were reviewed with Jackie Horton including uterine cancer, clot development, and cataract formation.    2.  Chronic constipation/mild hypercalcemia -  The patient was asked to take her multivitamin every other day in 07/2013. Chronic constipation may be due to excess calcium.  Parathyroid dysfunction is a possible differential diagnosis related to hypercalcemia.  I will also order a ionized calcium and PTH laboratories to be checked with her next calcium levels.  The patient was also asked to increase her oral hydration, fiber intake and green leafy vegetable intake.    We will order her bilateral digital screening mammogram in 01/2014 for her.  We plan to see Jackie Horton again in 01/2014 after her annual mammogram. We will check laboratories of CBC, CMP, LDH, and vitamin D level at that time.    All questions answered.  Jackie Horton was encouraged to contact us in the interim with any questions, concerns, or problems.  Ailene Ards, NP-C 07/18/2013, 6:30 PM

## 2013-07-18 NOTE — Telephone Encounter (Signed)
Per SN--   SN has reviewed labs from 9/22  BS 99, A1C 6.5   This is ok.  No diabetes, just pre-diabetic.  Treatment is diet.  No sweets.

## 2013-07-18 NOTE — Telephone Encounter (Signed)
Called, spoke with pt.  Informed her of below lab results and recs per Dr. Lenna Gilford.  She verbalized understanding.

## 2013-07-19 NOTE — Progress Notes (Signed)
I have seen the patient, examined her, and reviewed the plan with my nurse practitioner. The patient had background history of DCIS and have been on tamoxifen. She had minimal side effects with that. The patient wished to continue chemoprophylaxis with tamoxifen for a longer period of time. I am in agreement. Her mammogram is due next year and I will order that and see her to review the test results. The rest of the plan of care is as outlined by my nurse practitioner.

## 2013-07-20 ENCOUNTER — Ambulatory Visit (INDEPENDENT_AMBULATORY_CARE_PROVIDER_SITE_OTHER): Payer: Medicare Other

## 2013-07-20 DIAGNOSIS — Z23 Encounter for immunization: Secondary | ICD-10-CM

## 2013-07-21 ENCOUNTER — Telehealth: Payer: Self-pay | Admitting: Hematology and Oncology

## 2013-07-21 NOTE — Telephone Encounter (Signed)
lmonvm for pt re appts for 01/29/14 lb/NG and mammo @ [facility] 01/08/14. Pt was due for mammo before 01/23/14. Schedule/referral mailed.

## 2013-08-02 ENCOUNTER — Other Ambulatory Visit: Payer: Self-pay | Admitting: Pulmonary Disease

## 2013-10-11 ENCOUNTER — Other Ambulatory Visit: Payer: Self-pay | Admitting: Hematology & Oncology

## 2013-10-30 ENCOUNTER — Ambulatory Visit: Payer: Medicare Other | Admitting: Neurology

## 2013-11-17 ENCOUNTER — Ambulatory Visit (INDEPENDENT_AMBULATORY_CARE_PROVIDER_SITE_OTHER): Payer: Medicare Other | Admitting: Adult Health

## 2013-11-17 ENCOUNTER — Encounter: Payer: Self-pay | Admitting: Adult Health

## 2013-11-17 ENCOUNTER — Other Ambulatory Visit (INDEPENDENT_AMBULATORY_CARE_PROVIDER_SITE_OTHER): Payer: Medicare Other

## 2013-11-17 VITALS — BP 126/74 | HR 102 | Temp 97.7°F | Ht 62.0 in | Wt 126.0 lb

## 2013-11-17 DIAGNOSIS — J449 Chronic obstructive pulmonary disease, unspecified: Secondary | ICD-10-CM | POA: Insufficient documentation

## 2013-11-17 DIAGNOSIS — R0989 Other specified symptoms and signs involving the circulatory and respiratory systems: Secondary | ICD-10-CM

## 2013-11-17 DIAGNOSIS — R0609 Other forms of dyspnea: Secondary | ICD-10-CM

## 2013-11-17 LAB — BASIC METABOLIC PANEL
BUN: 15 mg/dL (ref 6–23)
CALCIUM: 10.3 mg/dL (ref 8.4–10.5)
CO2: 26 meq/L (ref 19–32)
CREATININE: 0.8 mg/dL (ref 0.4–1.2)
Chloride: 108 mEq/L (ref 96–112)
GFR: 86.63 mL/min (ref >60.00)
Glucose, Bld: 88 mg/dL (ref 70–99)
Potassium: 3.8 mEq/L (ref 3.5–5.1)
SODIUM: 142 meq/L (ref 135–145)

## 2013-11-17 LAB — CBC WITH DIFFERENTIAL/PLATELET
BASOS ABS: 0.1 10*3/uL (ref 0.0–0.1)
Basophils Relative: 0.7 % (ref 0.0–3.0)
EOS ABS: 0.1 10*3/uL (ref 0.0–0.7)
Eosinophils Relative: 1 % (ref 0.0–5.0)
HEMATOCRIT: 45 % (ref 36.0–46.0)
HEMOGLOBIN: 15.2 g/dL — AB (ref 12.0–15.0)
LYMPHS ABS: 3.3 10*3/uL (ref 0.7–4.0)
Lymphocytes Relative: 36.5 % (ref 12.0–46.0)
MCHC: 33.8 g/dL (ref 30.0–36.0)
MCV: 92.7 fl (ref 78.0–100.0)
MONO ABS: 0.6 10*3/uL (ref 0.1–1.0)
MONOS PCT: 6.5 % (ref 3.0–12.0)
NEUTROS ABS: 5 10*3/uL (ref 1.4–7.7)
Neutrophils Relative %: 55.3 % (ref 43.0–77.0)
PLATELETS: 220 10*3/uL (ref 150.0–400.0)
RBC: 4.86 Mil/uL (ref 3.87–5.11)
RDW: 13.8 % (ref 11.5–14.6)
WBC: 9.1 10*3/uL (ref 4.5–10.5)

## 2013-11-17 LAB — BRAIN NATRIURETIC PEPTIDE: Pro B Natriuretic peptide (BNP): 13 pg/mL (ref 0.0–100.0)

## 2013-11-17 LAB — TSH: TSH: 11.93 u[IU]/mL — AB (ref 0.35–5.50)

## 2013-11-17 NOTE — Progress Notes (Signed)
Subjective:    Patient ID: Jackie Horton, female    DOB: [birth year], 80 y.o.   MRN: [id number]  HPI 80 y/o BF smoker (smoked since age 24 )    11/17/2013 Acute OV  Complains of shortness of breath and cough for last year.  Does seem to be getting worse. Wears out easily . No energy. Feels tired a lot . Smokes few cigs daily  No significant congestion , fever or wt loss, chest pain , orthopnea, or edema.  Last chest xray 05/2013 w/ no acute process.  No dyspnea at rest.  No wheezing.  Ambulatory walk w/ no desats today  No previous PFT on record  Has smoked for last 60 + years.  No calf pain , pleuritic chest pain , exertional chest pain or hemoptysis.  Hx of breast cancer on tamoxifen.               PROBLEM LIST:       Cig smoker>  She smokes 5-10 cig/d now;  23 yr hx smoking all <1ppd;  Denies hx lung dis but notes some AM cough/ sputum & SOB/ DOE... ~  CXR 6/11 mild chr changes, clear/ NAD, right humeral head prosthesis... ~  CXR 2/13 showed stable heart shadow, clear lungs w/o acute dis, NAD.Marland Kitchen. ~  CXR 8/14 showed norm heart size, clear lungs, sl scoliosis/ right shoulder prostesis/ DJD spine     HBP>  On Amlodipine 1m/d, Maxzide 1/2 tab daily, & KCl 125m daily;   ~  7/12:  BP= 136/82, tolerating meds well, not checking BP at home, and denies CP/ palpit/ dizzy/ SOB/ edema/ etc... ~  2/13:  On Amlod10 & Maxzide25- 1/2 tab> BP= 120/76 & she remains asymptomatic; BUN=18, Creat=0.7, K=4.0 on 1015mKCl daily. ~  5/13:  BP= 138/70 on same meds& she remains asymptomatic... ~  2/14: on Amlod10, Maxzide-25 1/2 tab & K10; BP=120/76 & she is asymptomatic w/o CP, palpit, SOB, edema, etc. ~  8/14: on Amlod10, Maxzide-25 1/2 tab & K10; BP=128/80 & she is asymptomatic w/o CP, palpit, SOB, edema, etc.     Chol>  On Crestor 4m50mily w/ good control of lipids she thinks, but we don't have labs to review yet & not fasting today;  We discussed getting old records & checking FLP on ret  OV... ~  She has difficulty remembering to come to the office FASTING for blood work... ~  FLP Wales3 on Cres10 showed TChol 137, TG 126, HDL 44, LDL 68 ~  FLP 2/14 on Cres10 showed TChol 92, TG 67, HDL 36, LDL 42... OK to decr to 1/2 tab daily...     Hypothy>  Long hx hypothyroidism on replacement Rx w/ Synthroid 75mc38mper DrGegick...  ~  Labs 4/12 revealed TSH= 0.09... over suppressed on 75mcg1mPharm indicates regular refills & just got 90d supply, continue same 7 recheck 24mo. ~9mobs 7/12 showed TSH= 0.20, FreeT4= 1.15 (0.6-1.60)... Clinically euthyroid & she prefers to continue same dose for now. ~  Labs 2/13 showed TSH= 0.38 & she remains clinically euthyroid...     Constipation>  She tells me she goes only once per week & then only because she takes MOM;  She has NEVER HAD A COLONOSCOPY & is reluctant to consider this now;  We discussed Rx w/ Miralax & Senakot-S regularly, and referral to GI for constip eval & to consider the needed colonoscopy... ~  Labs 4/12 showed Hemoccults neg x6... She declined GI referral or eval... ~  BMs more regular on Miralax & Senakot-S; she continues to refuse referral to GI to discuss colonoscopy...     Urinary retention>  She had episode of U retention 6/11 several weeks after her AP repair surg, & eval in ER (EChart review)...     GYN>  S/p hysterectomy 65yr ago & A/P repair for pelvic organ prolapse 6/11 by DrLomax (symptoms improved post surg);  DrLomax does her PAP, Mammograms, etc;  She had breast bx= fibroadenoma in 2011...     Breast Cancer>  S/P surg for left breast cancer, DCIS 5/09 by DrStreck;  She had axillary lymph node bx 3/10- neg, & needle bx left breat 4/11 that was neg (fibrosis);  Oncology eval & f/u by DrMurinson on Tamoxifen 222md & he sees her Q6m33moLast [facility]... ~  She continues 81mo 64moow up visits w/ DrMurinson...     DJD>  She has mod DJD esp in L/S spine w/ scoliosis on XRays;  She had right  shoulder hemiarthroplasty 7/10 by DrWhitfield (right humeral head prosthesis seen on CXR now) & she has limited shoulder motion on exam... ~  7/12:  She reports cortisone shot in right shoulder from DrWhitfield for pain... ~  4/13:  She had eval DrTooke for CSpine dis w/ neck/ right arm pain> CT/MRI Cspine 4/13 showed Cx spondylosis, anterolithesis, & DDD; she reports the discomfort resolved w/ Lyrica...     Osteop>  ? [facility];  She takes MVI, VitC, & VitD 1000u daily...     Health Maintenance>  She says that she gets the yearly Flu vaccine, but has never had the Pneumonia shot, & she thinks that maybe she had a tetanus shot several yrs ago;  GYN= DrLomax;  GI= we will refer [facility] const[facility] & colonoscopy...   Past Surgical History  Procedure Date  . Partial hysterectomy 1972  . Breast lumpectomy 2009  . Right shoulder hemiarthroplasty per DrWhitfield...   [facility]    Outpatient Encounter Prescriptions as of 11/17/2013  Medication Sig  . amLODipine (NORVASC) 10 MG tablet TAKE ONE TABLET BY MOUTH ONCE DAILY  . aspirin 81 MG tablet Take 81 mg by mouth daily.  . CRESTOR 10 MG tablet TAKE ONE TABLET BY MOUTH ONCE DAILY  . magnesium hydroxide (MILK OF MAGNESIA) 400 MG/5ML suspension Take 15 mLs by mouth daily as needed for constipation.  . Multiple Vitamins-Minerals (CENTRUM SILVER ULTRA WOMENS) TABS Take 1 tablet by mouth daily.  . potassium chloride (K-DUR) 10 MEQ tablet TAKE ONE TABLET BY MOUTH ONCE DAILY  . pregabalin (LYRICA) 75 MG capsule Take 75 mg by mouth 2 (two) times daily.  . sennosides-docusate sodium (SENOKOT-S) 8.6-50 MG tablet Take 1 tablet by mouth daily.  . SYNMarland KitchenHROID 75 MCG tablet TAKE ONE TABLET EACH DAY  . tamoxifen (NOLVADEX) 20 MG tablet TAKE ONE TABLET EACH DAY  . triamterene-hydrochlorothiazide (MAXZIDE-25) 37.5-25 MG per tablet TAKE ONE-HALF  TABLET DAILY    No Known Allergies   Current Medications, Allergies, Past Medical History, Past Surgical History, Family History, and Social History were reviewed in [employer].    Review of Systems    Constitutional:  Denies F/C/S, anorexia, unexpected weight change. HEENT:  No HA, visual changes, earache, nasal symptoms, sore throat, hoarseness. Resp:  min cough & am sputum; no hemoptysis;  no SOB, tightness, wheezing. Cardio:  No CP, palpit, DOE, orthopnea, edema. GI:  Denies N/V/D/C or blood in stool; no reflux, abd pain, distention, or gas. GU:  No dysuria, freq, urgency, hematuria, or flank pain. MS:  +joint pain, no swelling or tenderness; decr ROM left shoulder; no neck pain, back pain, etc. Neuro:  No tremors, seizures, dizziness, syncope, weakness, numbness, gait abn. Skin:  No suspicious lesions or skin rash. Heme:  No adenopathy, bruising, bleeding. Psyche: Denies confusion, sleep disturbance, hallucinations, anxiety, depression.   Objective:   Physical Exam    WD, WN, 80 y/o BF in NAD... Vital Signs:  Reviewed...  General:  Alert & oriented; pleasant & cooperative... HEENT:  [place]/AT, EOM-wnl, PERRLA, EACs-clear, TMs-wnl, NOSE-clear, THROAT-clear & wnl. Neck:  Supple w/ fairROM; no JVD; normal carotid impulses w/o bruits; no thyromegaly or nodules palpated; no lymphadenopathy. Chest:  Clear to P & A; without wheezes/ rales/ or rhonchi heard... Heart:  Regular Rhythm; norm S1 & S2 without murmurs/ rubs/ or gallops detected... Abdomen:  Soft & nontender; normal bowel sounds; no organomegaly or masses palpated... Ext:  Normal ROM; without deformities or arthritic changes; no varicose veins, venous insuffic, or edema;  Pulses intact w/o bruits... Neuro:  gait normal & balance OK... Derm:  No lesions noted; no rash etc... Lymph:  No cervical, supraclavicular, axillary, or inguinal adenopathy palpated.Marland KitchenMarland Kitchen

## 2013-11-17 NOTE — Assessment & Plan Note (Addendum)
Progressive DOE in active smoker ? COPD component No desats with ambulation in office.  Check labs w/ bnp and cxr  (although lab/cxr 3 months ok )  Return for PFT   Plan  Continue on current regimen  Return in 2 weeks with PFT  I will call with labs and xray .  Please contact office for sooner follow up if symptoms do not improve or worsen or seek emergency care

## 2013-11-17 NOTE — Patient Instructions (Signed)
Continue on current regimen  Return in 2 weeks with PFT  I will call with labs and xray .  Please contact office for sooner follow up if symptoms do not improve or worsen or seek emergency care

## 2013-11-17 NOTE — Addendum Note (Signed)
Addended by: Parke Poisson E on: 11/17/2013 11:14 AM   Modules accepted: Orders

## 2013-11-21 ENCOUNTER — Telehealth: Payer: Self-pay | Admitting: Pulmonary Disease

## 2013-11-21 MED ORDER — LEVOTHYROXINE SODIUM 100 MCG PO TABS: 100.0000 ug | ORAL_TABLET | Freq: Every day | ORAL | Status: DC

## 2013-11-21 NOTE — Telephone Encounter (Signed)
Pt is aware of her lab work results. New rx has been sent in.

## 2013-12-19 ENCOUNTER — Telehealth: Payer: Self-pay | Admitting: Pulmonary Disease

## 2013-12-19 NOTE — Telephone Encounter (Signed)
Pt's appointments have been canceled per her request. She was to see TP and have bloodwork done. Advised her that she will need to find a new PCP to do this for her.

## 2013-12-20 ENCOUNTER — Telehealth: Payer: Self-pay | Admitting: Pulmonary Disease

## 2013-12-20 DIAGNOSIS — I10 Essential (primary) hypertension: Secondary | ICD-10-CM

## 2013-12-20 NOTE — Telephone Encounter (Signed)
Pt states that she was told she needed to find another PCP due to SN retiring-pt wanted [place] to give referral to PCP downstairs. I have placed this order for PCC's to help with this appointment.

## 2013-12-21 ENCOUNTER — Ambulatory Visit: Payer: Medicare Other | Admitting: Adult Health

## 2013-12-26 ENCOUNTER — Ambulatory Visit: Payer: Medicare Other | Admitting: Pulmonary Disease

## 2014-01-01 ENCOUNTER — Telehealth: Payer: Self-pay | Admitting: Pulmonary Disease

## 2014-01-01 NOTE — Telephone Encounter (Signed)
Made in error

## 2014-01-29 ENCOUNTER — Ambulatory Visit (HOSPITAL_BASED_OUTPATIENT_CLINIC_OR_DEPARTMENT_OTHER): Payer: Medicare Other | Admitting: Hematology and Oncology

## 2014-01-29 ENCOUNTER — Encounter: Payer: Self-pay | Admitting: Hematology and Oncology

## 2014-01-29 ENCOUNTER — Other Ambulatory Visit: Payer: Medicare Other

## 2014-01-29 VITALS — BP 155/82 | HR 104 | Temp 97.0°F | Resp 18 | Ht 62.0 in | Wt 126.4 lb

## 2014-01-29 DIAGNOSIS — F17200 Nicotine dependence, unspecified, uncomplicated: Secondary | ICD-10-CM

## 2014-01-29 DIAGNOSIS — Z853 Personal history of malignant neoplasm of breast: Secondary | ICD-10-CM

## 2014-01-29 DIAGNOSIS — C50919 Malignant neoplasm of unspecified site of unspecified female breast: Secondary | ICD-10-CM

## 2014-01-29 DIAGNOSIS — F17210 Nicotine dependence, cigarettes, uncomplicated: Secondary | ICD-10-CM

## 2014-01-29 NOTE — Progress Notes (Signed)
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 She underwent lumpectomy. Estrogen receptor and progesterone receptor were both 100%. The patient underwent radiation treatments to the left breast, receiving 6040 cGy from 04/02/2008 through 05/17/2008. She started on Tamoxifen in 04/2008 and discontinue end of March 2015   INTERVAL HISTORY: Jackie Horton 80 y.o. female returns for further followup. The patient continues to smoke, unwilling to quit. She denies any recent abnormal breast examination, palpable mass, abnormal breast appearance or nipple changes I have reviewed the past medical history, past surgical history, social history and family history with the patient and they are unchanged from previous note.  ALLERGIES:  has No Known Allergies.  MEDICATIONS:  Current Outpatient Prescriptions  Medication Sig Dispense Refill  . amLODipine (NORVASC) 10 MG tablet TAKE ONE TABLET BY MOUTH ONCE DAILY  90 tablet  3  . aspirin 81 MG tablet Take 81 mg by mouth daily.      . CRESTOR 10 MG tablet TAKE ONE TABLET BY MOUTH ONCE DAILY  90 tablet  3  . levothyroxine (SYNTHROID) 100 MCG tablet Take 1 tablet (100 mcg total) by mouth daily before breakfast.  30 tablet  5  . magnesium hydroxide (MILK OF MAGNESIA) 400 MG/5ML suspension Take 15 mLs by mouth daily as needed for constipation.      . Multiple Vitamins-Minerals (CENTRUM SILVER ULTRA WOMENS) TABS Take 1 tablet by mouth daily.      . potassium chloride (K-DUR) 10 MEQ tablet TAKE ONE TABLET BY MOUTH ONCE DAILY  90 tablet  2  . pregabalin (LYRICA) 75 MG capsule Take 75 mg by mouth 2 (two) times daily.      . sennosides-docusate sodium (SENOKOT-S) 8.6-50 MG tablet Take 1 tablet by mouth daily.      Marland Kitchen  triamterene-hydrochlorothiazide (MAXZIDE-25) 37.5-25 MG per tablet TAKE ONE-HALF TABLET DAILY  45 tablet  11  . tamoxifen (NOLVADEX) 20 MG tablet TAKE ONE TABLET EACH DAY  90 tablet  4   No current facility-administered medications for this visit.    REVIEW OF SYSTEMS:   Constitutional: Denies fevers, chills or abnormal weight loss Eyes: Denies blurriness of vision Ears, nose, mouth, throat, and face: Denies mucositis or sore throat Respiratory: Denies cough, dyspnea or wheezes Cardiovascular: Denies palpitation, chest discomfort or lower extremity swelling Gastrointestinal:  Denies nausea, heartburn or change in bowel habits Skin: Denies abnormal skin rashes Lymphatics: Denies new lymphadenopathy or easy bruising Neurological:Denies numbness, tingling or new weaknesses Behavioral/Psych: Mood is stable, no new changes  All other systems were reviewed with the patient and are negative.  PHYSICAL EXAMINATION: ECOG PERFORMANCE STATUS: 0 - Asymptomatic  Filed Vitals:   01/29/14 1140  BP: 155/82  Pulse: 104  Temp: 97 F (36.1 C)  Resp: 18   Filed Weights   01/29/14 1140  Weight: 126 lb 6.4 oz (57.335 kg)    GENERAL:alert, no distress and comfortable. She looks thiin SKIN: skin color, texture, turgor are normal, no rashes or significant lesions EYES: normal, Conjunctiva are pink and non-injected, sclera clear OROPHARYNX:no exudate, no erythema and lips, buccal mucosa, and tongue normal . No thrush NECK: supple, thyroid normal size, non-tender, without nodularity LYMPH:  no palpable lymphadenopathy in the cervical, axillary or inguinal LUNGS: clear to auscultation and percussion with normal  breathing effort HEART: regular rate & rhythm and no murmurs and no lower extremity edema ABDOMEN:abdomen soft, non-tender and normal bowel sounds Musculoskeletal:no cyanosis of digits and no clubbing  NEURO: alert & oriented x 3 with fluent speech, no focal motor/sensory deficits Bilateral  breast examination were performed. No palpable abnormalities. Well-healed lumpectomy scar in the left breast. LABORATORY DATA:  I have reviewed the data as listed    Component Value Date/Time   NA 142 11/17/2013 1123   NA 142 07/17/2013 1328   K 3.8 11/17/2013 1123   K 3.6 07/17/2013 1328   CL 108 11/17/2013 1123   CL 108* 12/16/2012 1029   CO2 26 11/17/2013 1123   CO2 26 07/17/2013 1328   GLUCOSE 88 11/17/2013 1123   GLUCOSE 99 07/17/2013 1328   GLUCOSE 105* 12/16/2012 1029   BUN 15 11/17/2013 1123   BUN 12.4 07/17/2013 1328   CREATININE 0.8 11/17/2013 1123   CREATININE 0.8 07/17/2013 1328   CALCIUM 10.3 11/17/2013 1123   CALCIUM 10.8* 07/17/2013 1328   PROT 7.8 07/17/2013 1328   PROT 7.0 06/16/2012 1446   ALBUMIN 4.3 07/17/2013 1328   ALBUMIN 4.0 06/16/2012 1446   AST 35* 07/17/2013 1328   AST 19 06/16/2012 1446   ALT 37 07/17/2013 1328   ALT 18 06/16/2012 1446   ALKPHOS 74 07/17/2013 1328   ALKPHOS 73 06/16/2012 1446   BILITOT 0.50 07/17/2013 1328   BILITOT 0.2* 06/16/2012 1446   GFRNONAA >60 04/13/2010 0844   GFRAA  Value: >60        The eGFR has been calculated using the MDRD equation. This calculation has not been validated in all clinical situations. eGFR's persistently <60 mL/min signify possible Chronic Kidney Disease. 04/13/2010 0844    No results found for this basename: SPEP,  UPEP,   kappa and lambda light chains    Lab Results  Component Value Date   WBC 9.1 11/17/2013   NEUTROABS 5.0 11/17/2013   HGB 15.2* 11/17/2013   HCT 45.0 11/17/2013   MCV 92.7 11/17/2013   PLT 220.0 11/17/2013      Chemistry      Component Value Date/Time   NA 142 11/17/2013 1123   NA 142 07/17/2013 1328   K 3.8 11/17/2013 1123   K 3.6 07/17/2013 1328   CL 108 11/17/2013 1123   CL 108* 12/16/2012 1029   CO2 26 11/17/2013 1123   CO2 26 07/17/2013 1328   BUN 15 11/17/2013 1123   BUN 12.4 07/17/2013 1328   CREATININE 0.8 11/17/2013 1123   CREATININE 0.8 07/17/2013 1328      Component Value Date/Time   CALCIUM  10.3 11/17/2013 1123   CALCIUM 10.8* 07/17/2013 1328   ALKPHOS 74 07/17/2013 1328   ALKPHOS 73 06/16/2012 1446   AST 35* 07/17/2013 1328   AST 19 06/16/2012 1446   ALT 37 07/17/2013 1328   ALT 18 06/16/2012 1446   BILITOT 0.50 07/17/2013 1328   BILITOT 0.2* 06/16/2012 1446       RADIOGRAPHIC STUDIES: Her most recent mammogram was normal. I have personally reviewed the radiological images as listed and agreed with the findings in the report.  ASSESSMENT & PLAN:  #1 left breast DCIS status post lumpectomy We will discontinue tamoxifen now. I recommend history, physical examination and mammogram on a yearly basis. #2 tobacco abuse I spent some time educating the patient importance of nicotine cessation. She will try to quit herself.  All questions were answered. The patient knows to call  the clinic with any problems, questions or concerns. No barriers to learning was detected. I spent 15 minutes counseling the patient face to face. The total time spent in the appointment was 20 minutes and more than 50% was on counseling and review of test results     [facility], [place], MD 01/29/2014 12:54 PM

## 2014-01-30 ENCOUNTER — Telehealth: Payer: Self-pay | Admitting: Hematology and Oncology

## 2014-01-30 NOTE — Telephone Encounter (Signed)
s.w. pt and advised on April 2016 appt.....pt ok and aware °

## 2014-02-12 ENCOUNTER — Other Ambulatory Visit: Payer: Self-pay | Admitting: Pulmonary Disease

## 2014-02-21 ENCOUNTER — Encounter: Payer: Self-pay | Admitting: Internal Medicine

## 2014-02-21 ENCOUNTER — Ambulatory Visit (INDEPENDENT_AMBULATORY_CARE_PROVIDER_SITE_OTHER): Payer: Medicare Other | Admitting: Internal Medicine

## 2014-02-21 ENCOUNTER — Telehealth: Payer: Self-pay | Admitting: Internal Medicine

## 2014-02-21 VITALS — BP 144/88 | HR 92 | Temp 97.2°F | Resp 16 | Ht 62.0 in | Wt 125.0 lb

## 2014-02-21 DIAGNOSIS — M8580 Other specified disorders of bone density and structure, unspecified site: Secondary | ICD-10-CM

## 2014-02-21 DIAGNOSIS — IMO0001 Reserved for inherently not codable concepts without codable children: Secondary | ICD-10-CM

## 2014-02-21 DIAGNOSIS — Z23 Encounter for immunization: Secondary | ICD-10-CM

## 2014-02-21 DIAGNOSIS — J4489 Other specified chronic obstructive pulmonary disease: Secondary | ICD-10-CM

## 2014-02-21 DIAGNOSIS — E039 Hypothyroidism, unspecified: Secondary | ICD-10-CM

## 2014-02-21 DIAGNOSIS — E1165 Type 2 diabetes mellitus with hyperglycemia: Principal | ICD-10-CM

## 2014-02-21 DIAGNOSIS — M899 Disorder of bone, unspecified: Secondary | ICD-10-CM

## 2014-02-21 DIAGNOSIS — F17210 Nicotine dependence, cigarettes, uncomplicated: Secondary | ICD-10-CM

## 2014-02-21 DIAGNOSIS — J449 Chronic obstructive pulmonary disease, unspecified: Secondary | ICD-10-CM

## 2014-02-21 DIAGNOSIS — Z0000 Encounter for general adult medical examination without abnormal findings: Secondary | ICD-10-CM

## 2014-02-21 DIAGNOSIS — I10 Essential (primary) hypertension: Secondary | ICD-10-CM

## 2014-02-21 DIAGNOSIS — E7800 Pure hypercholesterolemia, unspecified: Secondary | ICD-10-CM

## 2014-02-21 DIAGNOSIS — M949 Disorder of cartilage, unspecified: Secondary | ICD-10-CM

## 2014-02-21 DIAGNOSIS — E118 Type 2 diabetes mellitus with unspecified complications: Secondary | ICD-10-CM | POA: Insufficient documentation

## 2014-02-21 DIAGNOSIS — F17200 Nicotine dependence, unspecified, uncomplicated: Secondary | ICD-10-CM

## 2014-02-21 MED ORDER — TIOTROPIUM BROMIDE MONOHYDRATE 2.5 MCG/ACT IN AERS: 2.0000 | INHALATION_SPRAY | Freq: Every day | RESPIRATORY_TRACT | Status: DC

## 2014-02-21 NOTE — Patient Instructions (Signed)
Chronic Obstructive Pulmonary Disease  Chronic obstructive pulmonary disease (COPD) is a common lung condition in which airflow from the lungs is limited. COPD is a general term that can be used to describe many different lung problems that limit airflow, including both chronic bronchitis and emphysema.  If you have COPD, your lung function will probably never return to normal, but there are measures you can take to improve lung function and make yourself feel better.   CAUSES   · Smoking (common).    · Exposure to secondhand smoke.    · Genetic problems.  · Chronic inflammatory lung diseases or recurrent infections.  SYMPTOMS   · Shortness of breath, especially with physical activity.    · Deep, persistent (chronic) cough with a large amount of thick mucus.    · Wheezing.    · Rapid breaths (tachypnea).    · Gray or bluish discoloration (cyanosis) of the skin, especially in fingers, toes, or lips.    · Fatigue.    · Weight loss.    · Frequent infections or episodes when breathing symptoms become much worse (exacerbations).    · Chest tightness.  DIAGNOSIS   Your healthcare provider will take a medical history and perform a physical examination to make the initial diagnosis.  Additional tests for COPD may include:   · Lung (pulmonary) function tests.  · Chest X-ray.  · CT scan.  · Blood tests.  TREATMENT   Treatment available to help you feel better when you have COPD include:   · Inhaler and nebulizer medicines. These help manage the symptoms of COPD and make your breathing more comfortable  · Supplemental oxygen. Supplemental oxygen is only helpful if you have a low oxygen level in your blood.    · Exercise and physical activity. These are beneficial for nearly all people with COPD. Some people may also benefit from a pulmonary rehabilitation program.  HOME CARE INSTRUCTIONS   · Take all medicines (inhaled or pills) as directed by your health care provider.  · Only take over-the-counter or prescription medicines  for pain, fever, or discomfort as directed by your health care provider.    · Avoid over-the-counter medicines or cough syrups that dry up your airway (such as antihistamines) and slow down the elimination of secretions unless instructed otherwise by your healthcare provider.    · If you are a smoker, the most important thing that you can do is stop smoking. Continuing to smoke will cause further lung damage and breathing trouble. Ask your health care provider for help with quitting smoking. He or she can direct you to community resources or hospitals that provide support.  · Avoid exposure to irritants such as smoke, chemicals, and fumes that aggravate your breathing.  · Use oxygen therapy and pulmonary rehabilitation if directed by your health care provider. If you require home oxygen therapy, ask your healthcare provider whether you should purchase a pulse oximeter to measure your oxygen level at home.    · Avoid contact with individuals who have a contagious illness.  · Avoid extreme temperature and humidity changes.  · Eat healthy foods. Eating smaller, more frequent meals and resting before meals may help you maintain your strength.  · Stay active, but balance activity with periods of rest. Exercise and physical activity will help you maintain your ability to do things you want to do.  · Preventing infection and hospitalization is very important when you have COPD. Make sure to receive all the vaccines your health care provider recommends, especially the pneumococcal and influenza vaccines. Ask your healthcare provider whether you   need a pneumonia vaccine.  · Learn and use relaxation techniques to manage stress.  · Learn and use controlled breathing techniques as directed by your health care provider. Controlled breathing techniques include:    · Pursed lip breathing. Start by breathing in (inhaling) through your nose for 1 second. Then, purse your lips as if you were going to whistle and breathe out (exhale)  through the pursed lips for 2 seconds.    · Diaphragmatic breathing. Start by putting one hand on your abdomen just above your waist. Inhale slowly through your nose. The hand on your abdomen should move out. Then purse your lips and exhale slowly. You should be able to feel the hand on your abdomen moving in as you exhale.    · Learn and use controlled coughing to clear mucus from your lungs. Controlled coughing is a series of short, progressive coughs. The steps of controlled coughing are:    1. Lean your head slightly forward.    2. Breathe in deeply using diaphragmatic breathing.    3. Try to hold your breath for 3 seconds.    4. Keep your mouth slightly open while coughing twice.    5. Spit any mucus out into a tissue.    6. Rest and repeat the steps once or twice as needed.  SEEK MEDICAL CARE IF:   · You are coughing up more mucus than usual.    · There is a change in the color or thickness of your mucus.    · Your breathing is more labored than usual.    · Your breathing is faster than usual.    SEEK IMMEDIATE MEDICAL CARE IF:   · You have shortness of breath while you are resting.    · You have shortness of breath that prevents you from:  · Being able to talk.    · Performing your usual physical activities.    · You have chest pain lasting longer than 5 minutes.    · Your skin color is more cyanotic than usual.  · You measure low oxygen saturations for longer than 5 minutes with a pulse oximeter.  MAKE SURE YOU:   · Understand these instructions.  · Will watch your condition.  · Will get help right away if you are not doing well or get worse.  Document Released: 07/22/2005 Document Revised: 08/02/2013 Document Reviewed: 06/08/2013  Exit[employer]® Patient Information ©2014 [employer].

## 2014-02-21 NOTE — Assessment & Plan Note (Signed)
I will recheck her A1C and will advise further

## 2014-02-21 NOTE — Assessment & Plan Note (Signed)
She is due for a DEXA scan and I will check her VIt D level

## 2014-02-21 NOTE — Assessment & Plan Note (Signed)
I will check her FLP today and will advise further

## 2014-02-21 NOTE — Assessment & Plan Note (Signed)

## 2014-02-21 NOTE — Telephone Encounter (Signed)
Relevant patient education assigned to patient using Emmi. ° °

## 2014-02-21 NOTE — Progress Notes (Signed)
Pre visit review using our clinic review tool, if applicable. No additional management support is needed unless otherwise documented below in the visit note. 

## 2014-02-21 NOTE — Assessment & Plan Note (Signed)
I have asked her to start spiriva - I gave her a sample inhaler and showed her how to use it and she demonstrated proficiency with  use

## 2014-02-21 NOTE — Assessment & Plan Note (Signed)
Her BP is well controlled 

## 2014-02-21 NOTE — Assessment & Plan Note (Signed)
I will recheck her TSH and will adjust her dose if needed 

## 2014-02-21 NOTE — Progress Notes (Signed)
Subjective:    Patient ID: Jackie Horton, female    DOB: Aug 01, 1933, 80 y.o.   MRN: [id number]  Thyroid Problem Presents for follow-up visit. Symptoms include constipation and fatigue. Patient reports no anxiety, cold intolerance, depressed mood, diaphoresis, diarrhea, dry skin, hair loss, heat intolerance, hoarse voice, leg swelling, nail problem, palpitations, tremors, visual change, weight gain or weight loss. The symptoms have been stable. Past treatments include levothyroxine. The treatment provided moderate relief.  Cough This is a recurrent problem. The current episode started more than 1 year ago. The problem has been unchanged. The problem occurs every few hours. The cough is productive of sputum. Pertinent negatives include no chest pain, chills, ear congestion, ear pain, fever, headaches, heartburn, hemoptysis, myalgias, nasal congestion, postnasal drip, rash, rhinorrhea, sore throat, shortness of breath, sweats, weight loss or wheezing. She has tried nothing for the symptoms. Her past medical history is significant for COPD and emphysema. There is no history of asthma, bronchiectasis, bronchitis, environmental allergies or pneumonia.      Review of Systems  Constitutional: Positive for fatigue. Negative for fever, chills, weight loss, weight gain, diaphoresis, activity change, appetite change and unexpected weight change.  HENT: Negative.  Negative for ear pain, hoarse voice, postnasal drip, rhinorrhea and sore throat.   Eyes: Negative.   Respiratory: Positive for cough. Negative for apnea, hemoptysis, choking, chest tightness, shortness of breath, wheezing and stridor.   Cardiovascular: Negative.  Negative for chest pain, palpitations and leg swelling.  Gastrointestinal: Positive for constipation. Negative for heartburn, nausea, vomiting, abdominal pain and diarrhea.  Endocrine: Negative.  Negative for cold intolerance and heat intolerance.  Genitourinary: Negative.     Musculoskeletal: Negative.  Negative for arthralgias, back pain, myalgias and neck stiffness.  Skin: Negative.  Negative for rash.  Allergic/Immunologic: Negative.  Negative for environmental allergies.  Neurological: Negative.  Negative for dizziness, tremors, weakness and headaches.  Hematological: Negative.  Negative for adenopathy. Does not bruise/bleed easily.  Psychiatric/Behavioral: Negative.        Objective:   Physical Exam  Vitals reviewed. Constitutional: She is oriented to person, place, and time. She appears well-developed and well-nourished.  Non-toxic appearance. She does not have a sickly appearance. She does not appear ill. No distress.  HENT:  Head: Normocephalic and atraumatic.  Mouth/Throat: Oropharynx is clear and moist. No oropharyngeal exudate.  Eyes: Conjunctivae are normal. Right eye exhibits no discharge. Left eye exhibits no discharge. No scleral icterus.  Neck: Normal range of motion. Neck supple. No JVD present. No tracheal deviation present. No thyromegaly present.  Cardiovascular: Normal rate, regular rhythm, normal heart sounds and intact distal pulses.  Exam reveals no gallop and no friction rub.   No murmur heard. Pulmonary/Chest: Effort normal and breath sounds normal. No accessory muscle usage or stridor. Not tachypneic. No respiratory distress. She has no decreased breath sounds. She has no wheezes. She has no rhonchi. She has no rales. She exhibits no tenderness.  Abdominal: Soft. Bowel sounds are normal. She exhibits no distension and no mass. There is no tenderness. There is no rebound and no guarding.  Musculoskeletal: Normal range of motion. She exhibits no edema and no tenderness.  Lymphadenopathy:    She has no cervical adenopathy.  Neurological: She is oriented to person, place, and time.  Skin: Skin is warm and dry. No rash noted. She is not diaphoretic. No erythema. No pallor.  Psychiatric: She has a normal mood and affect. Her behavior is  normal. Judgment and thought content normal.  Lab Results  Component Value Date   WBC 9.1 11/17/2013   HGB 15.2* 11/17/2013   HCT 45.0 11/17/2013   PLT 220.0 11/17/2013   GLUCOSE 88 11/17/2013   CHOL 92 01/02/2013   TRIG 67.0 01/02/2013   HDL 36.20* 01/02/2013   LDLCALC 42 01/02/2013   ALT 37 07/17/2013   AST 35* 07/17/2013   NA 142 11/17/2013   K 3.8 11/17/2013   CL 108 11/17/2013   CREATININE 0.8 11/17/2013   BUN 15 11/17/2013   CO2 26 11/17/2013   TSH 11.93* 11/17/2013   INR 1.0 05/16/2009   HGBA1C 6.5* 07/06/2013       Assessment & Plan:

## 2014-02-22 ENCOUNTER — Telehealth: Payer: Self-pay

## 2014-02-22 NOTE — Telephone Encounter (Signed)
Relevant pt education material mailed  

## 2014-02-26 ENCOUNTER — Encounter (INDEPENDENT_AMBULATORY_CARE_PROVIDER_SITE_OTHER): Payer: Medicare Other | Admitting: Ophthalmology

## 2014-02-26 DIAGNOSIS — I10 Essential (primary) hypertension: Secondary | ICD-10-CM

## 2014-02-26 DIAGNOSIS — H35039 Hypertensive retinopathy, unspecified eye: Secondary | ICD-10-CM

## 2014-02-26 DIAGNOSIS — H3530 Unspecified macular degeneration: Secondary | ICD-10-CM

## 2014-02-26 DIAGNOSIS — H43819 Vitreous degeneration, unspecified eye: Secondary | ICD-10-CM

## 2014-02-26 DIAGNOSIS — H2510 Age-related nuclear cataract, unspecified eye: Secondary | ICD-10-CM

## 2014-02-28 ENCOUNTER — Other Ambulatory Visit (INDEPENDENT_AMBULATORY_CARE_PROVIDER_SITE_OTHER): Payer: Medicare Other

## 2014-02-28 ENCOUNTER — Other Ambulatory Visit: Payer: Self-pay | Admitting: Internal Medicine

## 2014-02-28 DIAGNOSIS — M949 Disorder of cartilage, unspecified: Secondary | ICD-10-CM

## 2014-02-28 DIAGNOSIS — J449 Chronic obstructive pulmonary disease, unspecified: Secondary | ICD-10-CM

## 2014-02-28 DIAGNOSIS — I10 Essential (primary) hypertension: Secondary | ICD-10-CM

## 2014-02-28 DIAGNOSIS — E7800 Pure hypercholesterolemia, unspecified: Secondary | ICD-10-CM

## 2014-02-28 DIAGNOSIS — E1165 Type 2 diabetes mellitus with hyperglycemia: Secondary | ICD-10-CM

## 2014-02-28 DIAGNOSIS — E039 Hypothyroidism, unspecified: Secondary | ICD-10-CM

## 2014-02-28 DIAGNOSIS — M8580 Other specified disorders of bone density and structure, unspecified site: Secondary | ICD-10-CM

## 2014-02-28 DIAGNOSIS — IMO0001 Reserved for inherently not codable concepts without codable children: Secondary | ICD-10-CM

## 2014-02-28 DIAGNOSIS — M899 Disorder of bone, unspecified: Secondary | ICD-10-CM

## 2014-02-28 LAB — BASIC METABOLIC PANEL
BUN: 12 mg/dL (ref 6–23)
CALCIUM: 10.4 mg/dL (ref 8.4–10.5)
CO2: 28 meq/L (ref 19–32)
Chloride: 105 mEq/L (ref 96–112)
Creatinine, Ser: 0.8 mg/dL (ref 0.4–1.2)
GFR: 94.5 mL/min (ref >60.00)
Glucose, Bld: 90 mg/dL (ref 70–99)
Potassium: 3.6 mEq/L (ref 3.5–5.1)
SODIUM: 141 meq/L (ref 135–145)

## 2014-02-28 LAB — CBC WITH DIFFERENTIAL/PLATELET
BASOS PCT: 0.4 % (ref 0.0–3.0)
Basophils Absolute: 0 10*3/uL (ref 0.0–0.1)
EOS ABS: 0.1 10*3/uL (ref 0.0–0.7)
Eosinophils Relative: 0.7 % (ref 0.0–5.0)
HCT: 45.2 % (ref 36.0–46.0)
Hemoglobin: 15 g/dL (ref 12.0–15.0)
LYMPHS PCT: 42.3 % (ref 12.0–46.0)
Lymphs Abs: 4.1 10*3/uL — ABNORMAL HIGH (ref 0.7–4.0)
MCHC: 33.2 g/dL (ref 30.0–36.0)
MCV: 92.9 fl (ref 78.0–100.0)
MONOS PCT: 8 % (ref 3.0–12.0)
Monocytes Absolute: 0.8 10*3/uL (ref 0.1–1.0)
NEUTROS ABS: 4.7 10*3/uL (ref 1.4–7.7)
NEUTROS PCT: 48.6 % (ref 43.0–77.0)
Platelets: 222 10*3/uL (ref 150.0–400.0)
RBC: 4.86 Mil/uL (ref 3.87–5.11)
RDW: 13 % (ref 11.5–15.5)
WBC: 9.6 10*3/uL (ref 4.0–10.5)

## 2014-02-28 LAB — LIPID PANEL
Cholesterol: 115 mg/dL (ref 0–200)
HDL: 36 mg/dL — AB (ref >39.00)
LDL CALC: 54 mg/dL (ref 0–99)
Total CHOL/HDL Ratio: 3
Triglycerides: 124 mg/dL (ref 0.0–149.0)
VLDL: 24.8 mg/dL (ref 0.0–40.0)

## 2014-02-28 LAB — TSH: TSH: 0.06 u[IU]/mL — AB (ref 0.35–4.50)

## 2014-02-28 LAB — HEMOGLOBIN A1C: Hgb A1c MFr Bld: 6.6 % — ABNORMAL HIGH (ref 4.6–6.5)

## 2014-02-28 MED ORDER — LEVOTHYROXINE SODIUM 88 MCG PO TABS: 88.0000 ug | ORAL_TABLET | Freq: Every day | ORAL | Status: DC

## 2014-03-01 ENCOUNTER — Encounter: Payer: Self-pay | Admitting: Internal Medicine

## 2014-03-01 LAB — VITAMIN D 25 HYDROXY (VIT D DEFICIENCY, FRACTURES): Vit D, 25-Hydroxy: 47 ng/mL (ref 30–89)

## 2014-03-05 LAB — HM DIABETES EYE EXAM

## 2014-03-06 ENCOUNTER — Telehealth: Payer: Self-pay | Admitting: Internal Medicine

## 2014-03-06 NOTE — Telephone Encounter (Signed)
Patient is calling requesting to speak with someone about her decreased energy level since last week. Please advise.

## 2014-03-07 NOTE — Telephone Encounter (Signed)
Called pt and scheduled OV for next week.

## 2014-03-07 NOTE — Telephone Encounter (Signed)
Called pt and there are no other symptoms. Advised that the best way to treat her is for her to make and appt. She has agreed.  Will you call her and set her up for a OV?

## 2014-03-07 NOTE — Telephone Encounter (Signed)
Patient is calling back about not having any energy.  Declined OV at this time. Please advise.

## 2014-03-12 ENCOUNTER — Telehealth: Payer: Self-pay | Admitting: Internal Medicine

## 2014-03-12 ENCOUNTER — Ambulatory Visit (INDEPENDENT_AMBULATORY_CARE_PROVIDER_SITE_OTHER): Payer: Medicare Other | Admitting: Internal Medicine

## 2014-03-12 ENCOUNTER — Encounter: Payer: Self-pay | Admitting: Internal Medicine

## 2014-03-12 VITALS — BP 138/80 | HR 94 | Temp 97.2°F | Resp 16 | Ht 62.0 in | Wt 124.5 lb

## 2014-03-12 DIAGNOSIS — J449 Chronic obstructive pulmonary disease, unspecified: Secondary | ICD-10-CM

## 2014-03-12 DIAGNOSIS — I10 Essential (primary) hypertension: Secondary | ICD-10-CM

## 2014-03-12 DIAGNOSIS — E039 Hypothyroidism, unspecified: Secondary | ICD-10-CM

## 2014-03-12 MED ORDER — UMECLIDINIUM BROMIDE 62.5 MCG/INH IN AEPB: 1.0000 | INHALATION_SPRAY | Freq: Every day | RESPIRATORY_TRACT | Status: DC

## 2014-03-12 NOTE — Telephone Encounter (Signed)
Relevant patient education mailed to patient.  

## 2014-03-12 NOTE — Assessment & Plan Note (Signed)
Her last TSH was suppressed so I lowered her dose

## 2014-03-12 NOTE — Assessment & Plan Note (Signed)
Will change her inhaler to incruse, I showed her how to use it today and she was able to open and use the inhaler

## 2014-03-12 NOTE — Assessment & Plan Note (Signed)
Her BP is well controlled 

## 2014-03-12 NOTE — Progress Notes (Signed)
   Subjective:    Patient ID: Jackie Horton, female    DOB: Jan 10, 1933, 80 y.o.   MRN: [id number]  HPI Comments: She returns for f/up and tells me that she does not have the hand strength to use the spiriva respimat inhaler, she requests a new inhaler. Other than that, her only complaint is fatigue.     Review of Systems  Constitutional: Positive for fatigue. Negative for fever, chills, diaphoresis, activity change, appetite change and unexpected weight change.  HENT: Negative.   Eyes: Negative.   Respiratory: Negative.  Negative for cough, choking, chest tightness, shortness of breath, wheezing and stridor.   Cardiovascular: Negative.  Negative for chest pain, palpitations and leg swelling.  Gastrointestinal: Negative.  Negative for nausea, vomiting, abdominal pain, diarrhea, constipation and blood in stool.  Endocrine: Negative.   Genitourinary: Negative.   Musculoskeletal: Negative.  Negative for arthralgias, back pain and myalgias.  Skin: Negative.   Allergic/Immunologic: Negative.   Neurological: Negative.  Negative for dizziness, tremors, syncope, facial asymmetry, weakness, light-headedness and numbness.  Hematological: Negative.  Negative for adenopathy. Does not bruise/bleed easily.  Psychiatric/Behavioral: Negative.        Objective:   Physical Exam  Vitals reviewed. Constitutional: She is oriented to person, place, and time. She appears well-developed and well-nourished. No distress.  HENT:  Head: Normocephalic and atraumatic.  Mouth/Throat: Oropharynx is clear and moist. No oropharyngeal exudate.  Eyes: Conjunctivae are normal. Right eye exhibits no discharge. Left eye exhibits no discharge. No scleral icterus.  Neck: Normal range of motion. Neck supple. No JVD present. No tracheal deviation present. No thyromegaly present.  Cardiovascular: Normal rate, regular rhythm, normal heart sounds and intact distal pulses.  Exam reveals no gallop and no friction rub.   No  murmur heard. Pulmonary/Chest: Effort normal and breath sounds normal. No stridor. No respiratory distress. She has no wheezes. She has no rales. She exhibits no tenderness.  Abdominal: Soft. Bowel sounds are normal. She exhibits no distension and no mass. There is no tenderness. There is no rebound and no guarding.  Musculoskeletal: Normal range of motion. She exhibits no edema and no tenderness.  Lymphadenopathy:    She has no cervical adenopathy.  Neurological: She is oriented to person, place, and time.  Skin: Skin is warm and dry. No rash noted. She is not diaphoretic. No erythema. No pallor.  Psychiatric: She has a normal mood and affect. Her behavior is normal. Judgment and thought content normal.     Lab Results  Component Value Date   WBC 9.6 02/28/2014   HGB 15.0 02/28/2014   HCT 45.2 02/28/2014   PLT 222.0 02/28/2014   GLUCOSE 90 02/28/2014   CHOL 115 02/28/2014   TRIG 124.0 02/28/2014   HDL 36.00* 02/28/2014   LDLCALC 54 02/28/2014   ALT 37 07/17/2013   AST 35* 07/17/2013   NA 141 02/28/2014   K 3.6 02/28/2014   CL 105 02/28/2014   CREATININE 0.8 02/28/2014   BUN 12 02/28/2014   CO2 28 02/28/2014   TSH 0.06* 02/28/2014   INR 1.0 05/16/2009   HGBA1C 6.6* 02/28/2014       Assessment & Plan:

## 2014-03-12 NOTE — Patient Instructions (Signed)

## 2014-03-12 NOTE — Progress Notes (Signed)
Pre visit review using our clinic review tool, if applicable. No additional management support is needed unless otherwise documented below in the visit note. 

## 2014-03-20 ENCOUNTER — Other Ambulatory Visit: Payer: Self-pay | Admitting: Pulmonary Disease

## 2014-04-24 ENCOUNTER — Telehealth: Payer: Self-pay | Admitting: *Deleted

## 2014-04-24 MED ORDER — PREGABALIN 75 MG PO CAPS: 75.0000 mg | ORAL_CAPSULE | Freq: Two times a day (BID) | ORAL | Status: DC

## 2014-04-24 NOTE — Telephone Encounter (Signed)
Left msg on triage pharmacy has been trying to get a refill on her lyrica haven't received response back from office. Pt is requesting refill to be filled today...Johny Chess

## 2014-04-24 NOTE — Telephone Encounter (Signed)
Notified pt rx fax to [facility]...Jackie Horton

## 2014-04-24 NOTE — Telephone Encounter (Signed)
Done, see Rx.

## 2014-05-11 ENCOUNTER — Other Ambulatory Visit: Payer: Self-pay | Admitting: Pulmonary Disease

## 2014-05-11 ENCOUNTER — Other Ambulatory Visit: Payer: Self-pay | Admitting: Internal Medicine

## 2014-06-12 ENCOUNTER — Ambulatory Visit (INDEPENDENT_AMBULATORY_CARE_PROVIDER_SITE_OTHER): Payer: Medicare Other | Admitting: Internal Medicine

## 2014-06-12 ENCOUNTER — Encounter: Payer: Self-pay | Admitting: Internal Medicine

## 2014-06-12 ENCOUNTER — Ambulatory Visit: Payer: Medicare Other | Admitting: Internal Medicine

## 2014-06-12 VITALS — BP 150/80 | HR 93 | Temp 98.0°F | Wt 124.8 lb

## 2014-06-12 DIAGNOSIS — R5381 Other malaise: Secondary | ICD-10-CM | POA: Insufficient documentation

## 2014-06-12 DIAGNOSIS — M7989 Other specified soft tissue disorders: Secondary | ICD-10-CM | POA: Insufficient documentation

## 2014-06-12 DIAGNOSIS — R5383 Other fatigue: Principal | ICD-10-CM

## 2014-06-12 DIAGNOSIS — I10 Essential (primary) hypertension: Secondary | ICD-10-CM

## 2014-06-12 MED ORDER — LOSARTAN POTASSIUM 50 MG PO TABS: 50.0000 mg | ORAL_TABLET | Freq: Every day | ORAL | Status: DC

## 2014-06-12 NOTE — Progress Notes (Signed)
Pre visit review using our clinic review tool, if applicable. No additional management support is needed unless otherwise documented below in the visit note. 

## 2014-06-12 NOTE — Patient Instructions (Signed)
Your EKG is Ok today  Please take all new medication as prescribed  - the losartan 50 mg per day  Please continue all other medications as before, and refills have been done if requested.  Please have the pharmacy call with any other refills you may need.  Please continue your efforts at being more active, low cholesterol diet, and weight control.  You are otherwise up to date with prevention measures today.  Please keep your appointments with your specialists as you may have planned  Please go to the XRAY Department in the Basement (go straight as you get off the elevator) for the x-ray testing  Please go to the LAB in the Basement (turn left off the elevator) for the tests to be done today  You will be contacted regarding the referral for: venous doppler  Please return to see Dr Ronnald Ramp as you have planned

## 2014-06-12 NOTE — Assessment & Plan Note (Addendum)
ECG reviewed as per emr, etiology unclear, for labs as ordered, cxr  Note:  Total time for pt hx, exam, review of record with pt in the room, determination of diagnoses and plan for further eval and tx is > 40 min, with over 50% spent in coordination and counseling of patient

## 2014-06-12 NOTE — Progress Notes (Signed)
Subjective:    Patient ID: Jackie Horton, female    DOB: 06-Jan-1933, 80 y.o.   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  Just cant understand her predicament.  Pt denies chest pain, increased sob or doe, wheezing, orthopnea, PND, palpitations, dizziness or syncope, but does have unusual 3 mo of mild LLE swelling (worse in day, better at night), no pain.   Pt denies polydipsia, polyuria, Pt states overall good compliance with meds, trying to follow lower cholesterol diet, wt overall stable,  Pt denies fever, wt loss, night sweats, loss of appetite, or other constitutional symptoms. No snoring, or daytime somnolence Denies worsening depressive symptoms, suicidal ideation, or panic Past Medical History  Diagnosis Date  . Breast cancer   . Hypertension   . Hypercholesteremia   . Hypothyroid   . Cigarette smoker   . Osteoarthritis   . Constipation    Past Surgical History  Procedure Laterality Date  . Partial hysterectomy  1972  . Breast lumpectomy  2009  . Rotator cuff repair    . Bladder tact  2011    reports that she has been smoking Cigarettes.  She has been smoking about 0.25 packs per day. She has never used smokeless tobacco. She reports that she does not drink alcohol or use illicit drugs. family history includes Aneurysm in her father; Breast cancer in her daughter and sister; Diabetes in her daughter. No Known Allergies Current Outpatient Prescriptions on File Prior to Visit  Medication Sig Dispense Refill  . amLODipine (NORVASC) 10 MG tablet TAKE ONE TABLET BY MOUTH ONCE DAILY  90 tablet  3  . aspirin 81 MG tablet Take 81 mg by mouth daily.      . CRESTOR 10 MG tablet TAKE ONE TABLET BY MOUTH ONCE DAILY  90 tablet  1  . levothyroxine (SYNTHROID, LEVOTHROID) 88 MCG tablet Take 1 tablet (88  mcg total) by mouth daily.  90 tablet  1  . magnesium hydroxide (MILK OF MAGNESIA) 400 MG/5ML suspension Take 15 mLs by mouth daily as needed for constipation.      . Multiple Vitamins-Minerals (CENTRUM SILVER ULTRA WOMENS) TABS Take 1 tablet by mouth daily.      . potassium chloride (K-DUR) 10 MEQ tablet TAKE ONE TABLET EACH DAY  90 tablet  1  . pregabalin (LYRICA) 75 MG capsule Take 1 capsule (75 mg total) by mouth 2 (two) times daily.  180 capsule  1  . SYNTHROID 75 MCG tablet TAKE ONE TABLET EACH DAY  90 tablet  1  . tamoxifen (NOLVADEX) 20 MG tablet TAKE ONE TABLET EACH DAY  90 tablet  4  . triamterene-hydrochlorothiazide (MAXZIDE-25) 37.5-25 MG per tablet TAKE ONE-HALF TABLET DAILY  45 tablet  1  . Umeclidinium Bromide (INCRUSE ELLIPTA) 62.5 MCG/INH AEPB Inhale 1 puff into the lungs daily.  30 each  11   No current facility-administered medications on file prior to visit.      Review of Systems  Constitutional: Negative for unusual diaphoresis or other sweats  HENT: Negative for ringing in ear Eyes: Negative for double vision or worsening visual disturbance.  Respiratory: Negative for choking and stridor.   Gastrointestinal: Negative for vomiting or other signifcant bowel change Genitourinary: Negative for hematuria or decreased urine volume.  Musculoskeletal: Negative for other MSK pain or swelling Skin: Negative for color change and worsening wound.  Neurological: Negative for tremors and numbness other than noted  Psychiatric/Behavioral: Negative for decreased concentration or agitation other than above       Objective:   Physical Exam BP 150/80  Pulse 93  Temp(Src) 98 F (36.7 C) (Oral)  Wt 124 lb 12 oz (56.586 kg)  SpO2 95% VS noted,  Constitutional: Pt appears well-developed, well-nourished.  HENT: Head: NCAT.  Right Ear: External ear normal.  Left Ear: External ear normal.  Eyes: . Pupils are equal, round, and reactive to light. Conjunctivae and EOM are  normal Neck: Normal range of motion. Neck supple.  Cardiovascular: Normal rate and regular rhythm.   Pulmonary/Chest: Effort normal and breath sounds normal.  Abd:  Soft, NT, ND, + BS Neurological: Pt is alert. Not confused , motor grossly intact Skin: Skin is warm. No rash Psychiatric: Pt behavior is normal. No agitation.  LLE with 1+ edema below knee    Assessment & Plan:   Wt Readings from Last 3 Encounters:  06/12/14 124 lb 12 oz (56.586 kg)  03/12/14 124 lb 8 oz (56.473 kg)  02/21/14 125 lb (56.7 kg)

## 2014-06-13 ENCOUNTER — Other Ambulatory Visit (INDEPENDENT_AMBULATORY_CARE_PROVIDER_SITE_OTHER): Payer: Medicare Other

## 2014-06-13 ENCOUNTER — Encounter: Payer: Self-pay | Admitting: Internal Medicine

## 2014-06-13 DIAGNOSIS — R5383 Other fatigue: Principal | ICD-10-CM

## 2014-06-13 DIAGNOSIS — M949 Disorder of cartilage, unspecified: Secondary | ICD-10-CM

## 2014-06-13 DIAGNOSIS — R5381 Other malaise: Secondary | ICD-10-CM

## 2014-06-13 DIAGNOSIS — M899 Disorder of bone, unspecified: Secondary | ICD-10-CM

## 2014-06-13 DIAGNOSIS — I10 Essential (primary) hypertension: Secondary | ICD-10-CM

## 2014-06-13 LAB — BASIC METABOLIC PANEL
BUN: 12 mg/dL (ref 6–23)
CALCIUM: 10.9 mg/dL — AB (ref 8.4–10.5)
CO2: 29 meq/L (ref 19–32)
Chloride: 101 mEq/L (ref 96–112)
Creatinine, Ser: 0.7 mg/dL (ref 0.4–1.2)
GFR: 105.57 mL/min (ref >60.00)
GLUCOSE: 122 mg/dL — AB (ref 70–99)
POTASSIUM: 3.6 meq/L (ref 3.5–5.1)
Sodium: 137 mEq/L (ref 135–145)

## 2014-06-13 LAB — CBC WITH DIFFERENTIAL/PLATELET
BASOS ABS: 0 10*3/uL (ref 0.0–0.1)
Basophils Relative: 0.6 % (ref 0.0–3.0)
EOS PCT: 1 % (ref 0.0–5.0)
Eosinophils Absolute: 0.1 10*3/uL (ref 0.0–0.7)
HCT: 46.1 % — ABNORMAL HIGH (ref 36.0–46.0)
Hemoglobin: 15.3 g/dL — ABNORMAL HIGH (ref 12.0–15.0)
LYMPHS ABS: 3.8 10*3/uL (ref 0.7–4.0)
LYMPHS PCT: 44.5 % (ref 12.0–46.0)
MCHC: 33.2 g/dL (ref 30.0–36.0)
MCV: 91.2 fl (ref 78.0–100.0)
MONOS PCT: 7.9 % (ref 3.0–12.0)
Monocytes Absolute: 0.7 10*3/uL (ref 0.1–1.0)
Neutro Abs: 3.9 10*3/uL (ref 1.4–7.7)
Neutrophils Relative %: 46 % (ref 43.0–77.0)
PLATELETS: 245 10*3/uL (ref 150.0–400.0)
RBC: 5.06 Mil/uL (ref 3.87–5.11)
RDW: 14 % (ref 11.5–15.5)
WBC: 8.5 10*3/uL (ref 4.0–10.5)

## 2014-06-13 LAB — URINALYSIS, ROUTINE W REFLEX MICROSCOPIC
Bilirubin Urine: NEGATIVE
Hgb urine dipstick: NEGATIVE
KETONES UR: NEGATIVE
Leukocytes, UA: NEGATIVE
NITRITE: NEGATIVE
PH: 7 (ref 5.0–8.0)
RBC / HPF: NONE SEEN (ref 0–?)
Total Protein, Urine: NEGATIVE
UROBILINOGEN UA: 0.2 (ref 0.0–1.0)
Urine Glucose: NEGATIVE

## 2014-06-13 LAB — HEPATIC FUNCTION PANEL
ALBUMIN: 4.3 g/dL (ref 3.5–5.2)
ALT: 27 U/L (ref 0–35)
AST: 21 U/L (ref 0–37)
Alkaline Phosphatase: 82 U/L (ref 39–117)
Bilirubin, Direct: 0.1 mg/dL (ref 0.0–0.3)
TOTAL PROTEIN: 7.8 g/dL (ref 6.0–8.3)
Total Bilirubin: 0.5 mg/dL (ref 0.2–1.2)

## 2014-06-13 LAB — LIPID PANEL
Cholesterol: 124 mg/dL (ref 0–200)
HDL: 37.7 mg/dL — ABNORMAL LOW (ref >39.00)
LDL CALC: 66 mg/dL (ref 0–99)
NonHDL: 86.3
Total CHOL/HDL Ratio: 3
Triglycerides: 100 mg/dL (ref 0.0–149.0)
VLDL: 20 mg/dL (ref 0.0–40.0)

## 2014-06-13 LAB — CORTISOL: Cortisol, Plasma: 6.9 ug/dL

## 2014-06-13 LAB — VITAMIN B12: Vitamin B-12: 700 pg/mL (ref 211–911)

## 2014-06-13 LAB — MAGNESIUM: Magnesium: 1.9 mg/dL (ref 1.5–2.5)

## 2014-06-13 LAB — VITAMIN D 25 HYDROXY (VIT D DEFICIENCY, FRACTURES): VITD: 59.29 ng/mL (ref 30.00–100.00)

## 2014-06-13 LAB — SEDIMENTATION RATE: Sed Rate: 12 mm/hr (ref 0–22)

## 2014-06-13 LAB — CK: Total CK: 72 U/L (ref 7–177)

## 2014-06-13 LAB — TSH: TSH: 0.04 u[IU]/mL — AB (ref 0.35–4.50)

## 2014-06-14 ENCOUNTER — Ambulatory Visit: Payer: Medicare Other

## 2014-06-14 ENCOUNTER — Encounter: Payer: Self-pay | Admitting: Internal Medicine

## 2014-06-14 DIAGNOSIS — R7309 Other abnormal glucose: Secondary | ICD-10-CM

## 2014-06-14 LAB — HEMOGLOBIN A1C: HEMOGLOBIN A1C: 6.2 % (ref 4.6–6.5)

## 2014-06-14 LAB — T4, FREE: Free T4: 1.67 ng/dL — ABNORMAL HIGH (ref 0.60–1.60)

## 2014-06-18 ENCOUNTER — Encounter (HOSPITAL_COMMUNITY): Payer: Self-pay | Admitting: Internal Medicine

## 2014-06-18 ENCOUNTER — Ambulatory Visit
Admission: RE | Admit: 2014-06-18 | Discharge: 2014-06-18 | Disposition: A | Discharge status: Home / Self Care | Payer: Medicare Other | Source: Ambulatory Visit | Attending: Internal Medicine | Admitting: Internal Medicine

## 2014-06-18 ENCOUNTER — Encounter (HOSPITAL_COMMUNITY): Payer: Medicare Other

## 2014-06-18 DIAGNOSIS — R0989 Other specified symptoms and signs involving the circulatory and respiratory systems: Secondary | ICD-10-CM

## 2014-06-18 NOTE — Assessment & Plan Note (Addendum)
Mild incresed, to add losartan 50 qd, o/w stable overall by history and exam, recent data reviewed with pt, and pt to continue medical treatment as before,  to f/u any worsening symptoms or concerns BP Readings from Last 3 Encounters:  06/12/14 150/80  03/12/14 138/80  02/21/14 144/88

## 2014-06-18 NOTE — Assessment & Plan Note (Signed)
?   Venous insuff vs other, for LE doppler  - r/o dvt

## 2014-06-19 ENCOUNTER — Encounter: Payer: Self-pay | Admitting: Internal Medicine

## 2014-07-06 ENCOUNTER — Ambulatory Visit (INDEPENDENT_AMBULATORY_CARE_PROVIDER_SITE_OTHER): Payer: Medicare Other | Admitting: Internal Medicine

## 2014-07-06 ENCOUNTER — Telehealth: Payer: Self-pay | Admitting: Internal Medicine

## 2014-07-06 ENCOUNTER — Encounter: Payer: Self-pay | Admitting: Internal Medicine

## 2014-07-06 VITALS — BP 120/70 | HR 95 | Temp 98.0°F | Resp 16 | Ht 62.0 in | Wt 123.5 lb

## 2014-07-06 DIAGNOSIS — I10 Essential (primary) hypertension: Secondary | ICD-10-CM

## 2014-07-06 DIAGNOSIS — R7309 Other abnormal glucose: Secondary | ICD-10-CM

## 2014-07-06 DIAGNOSIS — E038 Other specified hypothyroidism: Secondary | ICD-10-CM

## 2014-07-06 MED ORDER — LEVOTHYROXINE SODIUM 75 MCG PO TABS: 75.0000 ug | ORAL_TABLET | Freq: Every day | ORAL | Status: DC

## 2014-07-06 NOTE — Assessment & Plan Note (Signed)
She has pre-diabetes 

## 2014-07-06 NOTE — Progress Notes (Signed)
   Subjective:    Patient ID: Jackie Horton, female    DOB: 1932/11/15, 80 y.o.   MRN: [id number]  Thyroid Problem Presents for follow-up visit. Symptoms include anxiety, fatigue, heat intolerance and weight loss. Patient reports no cold intolerance, constipation, depressed mood, diaphoresis, diarrhea, dry skin, hair loss, hoarse voice, leg swelling, nail problem, palpitations, tremors, visual change or weight gain. The symptoms have been worsening. Past treatments include levothyroxine. The treatment provided moderate relief.      Review of Systems  Constitutional: Positive for weight loss, fatigue and unexpected weight change (some weight loss). Negative for fever, weight gain and diaphoresis.  HENT: Negative.  Negative for hoarse voice.   Eyes: Negative.   Respiratory: Negative.  Negative for apnea, cough, choking, chest tightness, shortness of breath, wheezing and stridor.   Cardiovascular: Negative.  Negative for chest pain, palpitations and leg swelling.  Gastrointestinal: Negative.  Negative for abdominal pain, diarrhea and constipation.  Endocrine: Positive for heat intolerance. Negative for cold intolerance, polydipsia, polyphagia and polyuria.  Genitourinary: Negative.   Musculoskeletal: Negative.   Skin: Negative.   Allergic/Immunologic: Negative.   Neurological: Negative.  Negative for tremors.  Hematological: Negative.  Negative for adenopathy. Does not bruise/bleed easily.  Psychiatric/Behavioral: Negative.        Objective:   Physical Exam  Vitals reviewed. Constitutional: She is oriented to person, place, and time. She appears well-developed and well-nourished.  Non-toxic appearance. She does not have a sickly appearance. She does not appear ill. No distress.  HENT:  Head: Normocephalic and atraumatic.  Mouth/Throat: Oropharynx is clear and moist. No oropharyngeal exudate.  Eyes: Conjunctivae are normal. Right eye exhibits no discharge. Left eye exhibits no  discharge. No scleral icterus.  Neck: Normal range of motion. Neck supple. No JVD present. No tracheal deviation present. No thyromegaly present.  Cardiovascular: Regular rhythm, S1 normal, S2 normal, normal heart sounds and intact distal pulses.  Tachycardia present.  Exam reveals no gallop and no friction rub.   No murmur heard. Pulmonary/Chest: Effort normal and breath sounds normal. No stridor. No respiratory distress. She has no wheezes. She has no rales. She exhibits no tenderness.  Abdominal: Soft. Bowel sounds are normal. She exhibits no distension and no mass. There is no tenderness. There is no rebound and no guarding.  Musculoskeletal: Normal range of motion. She exhibits no edema and no tenderness.  Lymphadenopathy:    She has no cervical adenopathy.  Neurological: She is oriented to person, place, and time.  Skin: Skin is warm and dry. No rash noted. She is not diaphoretic. No erythema. No pallor.  Psychiatric: She has a normal mood and affect. Her behavior is normal. Judgment and thought content normal.     Lab Results  Component Value Date   WBC 8.5 06/13/2014   HGB 15.3* 06/13/2014   HCT 46.1* 06/13/2014   PLT 245.0 06/13/2014   GLUCOSE 122* 06/13/2014   CHOL 124 06/13/2014   TRIG 100.0 06/13/2014   HDL 37.70* 06/13/2014   LDLCALC 66 06/13/2014   ALT 27 06/13/2014   AST 21 06/13/2014   NA 137 06/13/2014   K 3.6 06/13/2014   CL 101 06/13/2014   CREATININE 0.7 06/13/2014   BUN 12 06/13/2014   CO2 29 06/13/2014   TSH 0.04* 06/13/2014   INR 1.0 05/16/2009   HGBA1C 6.2 06/14/2014       Assessment & Plan:

## 2014-07-06 NOTE — Assessment & Plan Note (Signed)
Her BP is well controlled Her lytes and renal function are stable 

## 2014-07-06 NOTE — Patient Instructions (Signed)
Hypothyroidism The thyroid is a large gland located in the lower front of your neck. The thyroid gland helps control metabolism. Metabolism is how your body handles food. It controls metabolism with the hormone thyroxine. When this gland is underactive (hypothyroid), it produces too little hormone.  CAUSES These include:   Absence or destruction of thyroid tissue.  Goiter due to iodine deficiency.  Goiter due to medications.  Congenital defects (since birth).  Problems with the pituitary. This causes a lack of TSH (thyroid stimulating hormone). This hormone tells the thyroid to turn out more hormone. SYMPTOMS  Lethargy (feeling as though you have no energy)  Cold intolerance  Weight gain (in spite of normal food intake)  Dry skin  Coarse hair  Menstrual irregularity (if severe, may lead to infertility)  Slowing of thought processes Cardiac problems are also caused by insufficient amounts of thyroid hormone. Hypothyroidism in the newborn is cretinism, and is an extreme form. It is important that this form be treated adequately and immediately or it will lead rapidly to retarded physical and mental development. DIAGNOSIS  To prove hypothyroidism, your caregiver may do blood tests and ultrasound tests. Sometimes the signs are hidden. It may be necessary for your caregiver to watch this illness with blood tests either before or after diagnosis and treatment. TREATMENT  Low levels of thyroid hormone are increased by using synthetic thyroid hormone. This is a safe, effective treatment. It usually takes about four weeks to gain the full effects of the medication. After you have the full effect of the medication, it will generally take another four weeks for problems to leave. Your caregiver may start you on low doses. If you have had heart problems the dose may be gradually increased. It is generally not an emergency to get rapidly to normal. HOME CARE INSTRUCTIONS   Take your  medications as your caregiver suggests. Let your caregiver know of any medications you are taking or start taking. Your caregiver will help you with dosage schedules.  As your condition improves, your dosage needs may increase. It will be necessary to have continuing blood tests as suggested by your caregiver.  Report all suspected medication side effects to your caregiver. SEEK MEDICAL CARE IF: Seek medical care if you develop:  Sweating.  Tremulousness (tremors).  Anxiety.  Rapid weight loss.  Heat intolerance.  Emotional swings.  Diarrhea.  Weakness. SEEK IMMEDIATE MEDICAL CARE IF:  You develop chest pain, an irregular heart beat (palpitations), or a rapid heart beat. MAKE SURE YOU:   Understand these instructions.  Will watch your condition.  Will get help right away if you are not doing well or get worse. Document Released: 10/12/2005 Document Revised: 01/04/2012 Document Reviewed: 06/01/2008 Exit[employer] Patient Information 2015 [employer]. This information is not intended to replace advice given to you by your health care provider. Make sure you discuss any questions you have with your health care provider.  

## 2014-07-06 NOTE — Telephone Encounter (Signed)
na

## 2014-07-06 NOTE — Assessment & Plan Note (Signed)
She was seen a few weeks ago by another provider and her TSH was significantly suppressed but it appears that her synthroid dose was not lowered, today I have decreased her dose from 88 mcg to 75 mcg, she was upset that she just spent $45 on the higher dose but I encouraged her to go ahead and drop down to the lower dose. Will recheck her TSH level in a few months and will cont to adjust the dose as needed.

## 2014-08-13 ENCOUNTER — Other Ambulatory Visit: Payer: Self-pay | Admitting: Internal Medicine

## 2014-08-13 ENCOUNTER — Telehealth: Payer: Self-pay | Admitting: Internal Medicine

## 2014-08-13 MED ORDER — AMLODIPINE BESYLATE 10 MG PO TABS: ORAL_TABLET | ORAL | Status: DC

## 2014-08-13 NOTE — Telephone Encounter (Signed)
Notified pt rx sent to [facility]...Johny Chess

## 2014-08-13 NOTE — Telephone Encounter (Signed)
Patient need a refill of Amlodipine 10 mg send into pharmacy [facility], on [address]

## 2014-08-22 ENCOUNTER — Other Ambulatory Visit: Payer: Self-pay | Admitting: Pulmonary Disease

## 2014-09-26 ENCOUNTER — Telehealth: Payer: Self-pay | Admitting: Internal Medicine

## 2014-09-26 NOTE — Telephone Encounter (Signed)
Outgoing call made by [facility] pharmacist to discuss medication adherence with crestor and losartan. Pt stated that she was instructed by Dr. Rosendo Gros to cut both meds in half. If pt is to only take half then new Rx must be written for that exact sig and quantity. Patient has a follow up appointment 10/08/14 and can discuss adherence more at that appointment. Thanks

## 2014-09-27 ENCOUNTER — Other Ambulatory Visit: Payer: Self-pay | Admitting: Internal Medicine

## 2014-10-02 ENCOUNTER — Other Ambulatory Visit: Payer: Self-pay | Admitting: Pulmonary Disease

## 2014-10-08 ENCOUNTER — Encounter: Payer: Self-pay | Admitting: Internal Medicine

## 2014-10-08 ENCOUNTER — Ambulatory Visit (INDEPENDENT_AMBULATORY_CARE_PROVIDER_SITE_OTHER): Payer: Medicare Other | Admitting: Internal Medicine

## 2014-10-08 ENCOUNTER — Ambulatory Visit (INDEPENDENT_AMBULATORY_CARE_PROVIDER_SITE_OTHER): Payer: Medicare Other

## 2014-10-08 ENCOUNTER — Other Ambulatory Visit (INDEPENDENT_AMBULATORY_CARE_PROVIDER_SITE_OTHER): Payer: Medicare Other

## 2014-10-08 ENCOUNTER — Other Ambulatory Visit: Payer: Self-pay | Admitting: Internal Medicine

## 2014-10-08 VITALS — BP 124/80 | HR 90 | Temp 98.2°F | Resp 16 | Ht 62.0 in | Wt 127.0 lb

## 2014-10-08 DIAGNOSIS — E038 Other specified hypothyroidism: Secondary | ICD-10-CM

## 2014-10-08 DIAGNOSIS — E7800 Pure hypercholesterolemia, unspecified: Secondary | ICD-10-CM

## 2014-10-08 DIAGNOSIS — I10 Essential (primary) hypertension: Secondary | ICD-10-CM

## 2014-10-08 DIAGNOSIS — R739 Hyperglycemia, unspecified: Secondary | ICD-10-CM

## 2014-10-08 DIAGNOSIS — Z23 Encounter for immunization: Secondary | ICD-10-CM

## 2014-10-08 LAB — BASIC METABOLIC PANEL
BUN: 15 mg/dL (ref 6–23)
CHLORIDE: 105 meq/L (ref 96–112)
CO2: 25 mEq/L (ref 19–32)
Calcium: 10.7 mg/dL — ABNORMAL HIGH (ref 8.4–10.5)
Creatinine, Ser: 0.7 mg/dL (ref 0.4–1.2)
GFR: 107.28 mL/min (ref >60.00)
Glucose, Bld: 94 mg/dL (ref 70–99)
Potassium: 3.6 mEq/L (ref 3.5–5.1)
Sodium: 138 mEq/L (ref 135–145)

## 2014-10-08 LAB — HEMOGLOBIN A1C: HEMOGLOBIN A1C: 6.5 % (ref 4.6–6.5)

## 2014-10-08 LAB — TSH: TSH: 0.13 u[IU]/mL — AB (ref 0.35–4.50)

## 2014-10-08 MED ORDER — PRAVASTATIN SODIUM 20 MG PO TABS: 20.0000 mg | ORAL_TABLET | Freq: Every day | ORAL | Status: DC

## 2014-10-08 MED ORDER — LEVOTHYROXINE SODIUM 50 MCG PO TABS: 50.0000 ug | ORAL_TABLET | Freq: Every day | ORAL | Status: DC

## 2014-10-08 NOTE — Assessment & Plan Note (Signed)
Her BP is well controlled I will monitor her lytes and renal function 

## 2014-10-08 NOTE — Assessment & Plan Note (Addendum)
I will recheck her TSH and will adjust her dose if needed  Late note - her TSH is suppressed so I will lower her dose

## 2014-10-08 NOTE — Addendum Note (Signed)
Addended by: Janith Lima on: 10/08/2014 02:49 PM   Modules accepted: Orders, Medications

## 2014-10-08 NOTE — Patient Instructions (Signed)
Hypothyroidism The thyroid is a large gland located in the lower front of your neck. The thyroid gland helps control metabolism. Metabolism is how your body handles food. It controls metabolism with the hormone thyroxine. When this gland is underactive (hypothyroid), it produces too little hormone.  CAUSES These include:   Absence or destruction of thyroid tissue.  Goiter due to iodine deficiency.  Goiter due to medications.  Congenital defects (since birth).  Problems with the pituitary. This causes a lack of TSH (thyroid stimulating hormone). This hormone tells the thyroid to turn out more hormone. SYMPTOMS  Lethargy (feeling as though you have no energy)  Cold intolerance  Weight gain (in spite of normal food intake)  Dry skin  Coarse hair  Menstrual irregularity (if severe, may lead to infertility)  Slowing of thought processes Cardiac problems are also caused by insufficient amounts of thyroid hormone. Hypothyroidism in the newborn is cretinism, and is an extreme form. It is important that this form be treated adequately and immediately or it will lead rapidly to retarded physical and mental development. DIAGNOSIS  To prove hypothyroidism, your caregiver may do blood tests and ultrasound tests. Sometimes the signs are hidden. It may be necessary for your caregiver to watch this illness with blood tests either before or after diagnosis and treatment. TREATMENT  Low levels of thyroid hormone are increased by using synthetic thyroid hormone. This is a safe, effective treatment. It usually takes about four weeks to gain the full effects of the medication. After you have the full effect of the medication, it will generally take another four weeks for problems to leave. Your caregiver may start you on low doses. If you have had heart problems the dose may be gradually increased. It is generally not an emergency to get rapidly to normal. HOME CARE INSTRUCTIONS   Take your  medications as your caregiver suggests. Let your caregiver know of any medications you are taking or start taking. Your caregiver will help you with dosage schedules.  As your condition improves, your dosage needs may increase. It will be necessary to have continuing blood tests as suggested by your caregiver.  Report all suspected medication side effects to your caregiver. SEEK MEDICAL CARE IF: Seek medical care if you develop:  Sweating.  Tremulousness (tremors).  Anxiety.  Rapid weight loss.  Heat intolerance.  Emotional swings.  Diarrhea.  Weakness. SEEK IMMEDIATE MEDICAL CARE IF:  You develop chest pain, an irregular heart beat (palpitations), or a rapid heart beat. MAKE SURE YOU:   Understand these instructions.  Will watch your condition.  Will get help right away if you are not doing well or get worse. Document Released: 10/12/2005 Document Revised: 01/04/2012 Document Reviewed: 06/01/2008 Exit[employer] Patient Information 2015 [employer]. This information is not intended to replace advice given to you by your health care provider. Make sure you discuss any questions you have with your health care provider.  

## 2014-10-08 NOTE — Progress Notes (Signed)
Pre visit review using our clinic review tool, if applicable. No additional management support is needed unless otherwise documented below in the visit note. 

## 2014-10-08 NOTE — Assessment & Plan Note (Signed)
She tells me that crestor is too expensive Will change to pravastatin

## 2014-10-08 NOTE — Progress Notes (Signed)
   Subjective:    Patient ID: Jackie Horton, female    DOB: 24-Apr-1933, 80 y.o.   MRN: [id number]  Thyroid Problem Presents for follow-up visit. Symptoms include cold intolerance and fatigue. Patient reports no anxiety, constipation, depressed mood, diaphoresis, diarrhea, dry skin, hair loss, heat intolerance, hoarse voice, leg swelling, nail problem, palpitations, tremors, visual change, weight gain or weight loss. The symptoms have been stable. Past treatments include levothyroxine. The treatment provided moderate relief.      Review of Systems  Constitutional: Positive for fatigue. Negative for fever, chills, weight loss, weight gain, diaphoresis and appetite change.  HENT: Negative.  Negative for hoarse voice.   Eyes: Negative.   Respiratory: Negative.  Negative for cough, choking, chest tightness, shortness of breath and stridor.   Cardiovascular: Negative.  Negative for chest pain, palpitations and leg swelling.  Gastrointestinal: Negative.  Negative for abdominal pain, diarrhea and constipation.  Endocrine: Positive for cold intolerance. Negative for heat intolerance.  Genitourinary: Negative.   Musculoskeletal: Negative.  Negative for myalgias, back pain and arthralgias.  Skin: Negative.   Allergic/Immunologic: Negative.   Neurological: Negative.  Negative for tremors.  Hematological: Negative.  Negative for adenopathy. Does not bruise/bleed easily.  Psychiatric/Behavioral: Negative.  The patient is not nervous/anxious.        Objective:   Physical Exam  Constitutional: She is oriented to person, place, and time. She appears well-developed and well-nourished. No distress.  HENT:  Head: Normocephalic and atraumatic.  Mouth/Throat: Oropharynx is clear and moist. No oropharyngeal exudate.  Eyes: Conjunctivae are normal. Right eye exhibits no discharge. Left eye exhibits no discharge. No scleral icterus.  Neck: Normal range of motion. Neck supple. No JVD present. No tracheal  deviation present. No thyromegaly present.  Cardiovascular: Normal rate, regular rhythm, normal heart sounds and intact distal pulses.  Exam reveals no gallop and no friction rub.   No murmur heard. Pulmonary/Chest: Effort normal and breath sounds normal. No stridor. No respiratory distress. She has no wheezes. She has no rales. She exhibits no tenderness.  Abdominal: Soft. Bowel sounds are normal. She exhibits no distension and no mass. There is no tenderness. There is no rebound and no guarding.  Musculoskeletal: Normal range of motion. She exhibits no edema or tenderness.  Lymphadenopathy:    She has no cervical adenopathy.  Neurological: She is oriented to person, place, and time.  Skin: Skin is warm and dry. No rash noted. She is not diaphoretic. No erythema. No pallor.  Psychiatric: She has a normal mood and affect. Her behavior is normal. Judgment and thought content normal.  Vitals reviewed.     Lab Results  Component Value Date   WBC 8.5 06/13/2014   HGB 15.3* 06/13/2014   HCT 46.1* 06/13/2014   PLT 245.0 06/13/2014   GLUCOSE 122* 06/13/2014   CHOL 124 06/13/2014   TRIG 100.0 06/13/2014   HDL 37.70* 06/13/2014   LDLCALC 66 06/13/2014   ALT 27 06/13/2014   AST 21 06/13/2014   NA 137 06/13/2014   K 3.6 06/13/2014   CL 101 06/13/2014   CREATININE 0.7 06/13/2014   BUN 12 06/13/2014   CO2 29 06/13/2014   TSH 0.04* 06/13/2014   INR 1.0 05/16/2009   HGBA1C 6.2 06/14/2014      Assessment & Plan:

## 2014-10-09 ENCOUNTER — Telehealth: Payer: Self-pay

## 2014-10-09 NOTE — Telephone Encounter (Signed)
Patient kept follow up appointment as scheduled with PCP.     Hypercholesteremia - Janith Lima, MD at 10/08/2014 12:58 PM     Status: Written Related Problem: Hypercholesteremia   Expand All Collapse All   She tells me that crestor is too expensive Will change to pravastatin         Hypothyroid - Janith Lima, MD at 10/08/2014 12:58 PM     Status: Alison Stalling Related Problem: Hypothyroid   Expand All Collapse All   I will recheck her TSH and will adjust her dose if needed  Late note - her TSH is suppressed so I will lower her dose     Rx for generic levothyroxine, pravastatin and  Losartan sent to pharmacy.

## 2014-10-09 NOTE — Telephone Encounter (Signed)
Jackie Horton ([birth year])  Losartan 50 mg (filled #90 for 90 day) o States that she takes  tablet daily  Crestor 10 mg (75%) o States that she takes  tablet daily  States that she is concerned about the price of her Synthroid (prescribed by Dr. Nicki Reaper?) o Would the generic levothyroxine be appropriate? (~ $95/90 days)  MD visit on 10-08-14  ACTION ITEMS: o Verify current dose of losartan and Crestor. o Re-write 90 day Rx to reflect current dosage of both losartan and Crestor o Follow up with Dr. Nicki Reaper to see if generic Synthroid is appropriate?

## 2014-10-30 ENCOUNTER — Other Ambulatory Visit: Payer: Self-pay | Admitting: Internal Medicine

## 2014-10-30 ENCOUNTER — Other Ambulatory Visit: Payer: Self-pay | Admitting: *Deleted

## 2014-10-30 MED ORDER — PREGABALIN 75 MG PO CAPS: 75.0000 mg | ORAL_CAPSULE | Freq: Two times a day (BID) | ORAL | Status: DC

## 2014-10-31 ENCOUNTER — Telehealth: Payer: Self-pay | Admitting: Internal Medicine

## 2014-10-31 DIAGNOSIS — E7800 Pure hypercholesterolemia, unspecified: Secondary | ICD-10-CM

## 2014-10-31 MED ORDER — PRAVASTATIN SODIUM 20 MG PO TABS: 20.0000 mg | ORAL_TABLET | Freq: Every day | ORAL | Status: DC

## 2014-10-31 NOTE — Telephone Encounter (Signed)
Patient states insurance is not covering Crestor.  She is requesting to be placed on something her insurance will cover and have it sent to [facility].

## 2014-10-31 NOTE — Telephone Encounter (Signed)
This has been changed to pravachol

## 2014-10-31 NOTE — Telephone Encounter (Signed)
Sent request for Pravachol 20 MG to [facility] and let pt know.

## 2014-12-27 ENCOUNTER — Telehealth: Payer: Self-pay | Admitting: Hematology and Oncology

## 2014-12-27 NOTE — Telephone Encounter (Signed)
pt called to cx appt...she will call to r/s appt

## 2015-01-14 DIAGNOSIS — Z1231 Encounter for screening mammogram for malignant neoplasm of breast: Secondary | ICD-10-CM | POA: Diagnosis not present

## 2015-01-21 DIAGNOSIS — R921 Mammographic calcification found on diagnostic imaging of breast: Secondary | ICD-10-CM | POA: Diagnosis not present

## 2015-01-21 DIAGNOSIS — N63 Unspecified lump in breast: Secondary | ICD-10-CM | POA: Diagnosis not present

## 2015-01-21 LAB — HM MAMMOGRAPHY: HM MAMMO: ABNORMAL

## 2015-01-29 ENCOUNTER — Other Ambulatory Visit: Payer: Self-pay | Admitting: Radiology

## 2015-01-29 DIAGNOSIS — Z803 Family history of malignant neoplasm of breast: Secondary | ICD-10-CM | POA: Diagnosis not present

## 2015-01-29 DIAGNOSIS — N63 Unspecified lump in breast: Secondary | ICD-10-CM | POA: Diagnosis not present

## 2015-01-29 DIAGNOSIS — Z853 Personal history of malignant neoplasm of breast: Secondary | ICD-10-CM | POA: Diagnosis not present

## 2015-01-29 DIAGNOSIS — N6032 Fibrosclerosis of left breast: Secondary | ICD-10-CM | POA: Diagnosis not present

## 2015-01-31 ENCOUNTER — Ambulatory Visit: Payer: Medicare Other | Admitting: Hematology and Oncology

## 2015-02-06 DIAGNOSIS — R920 Mammographic microcalcification found on diagnostic imaging of breast: Secondary | ICD-10-CM | POA: Diagnosis not present

## 2015-02-06 DIAGNOSIS — N641 Fat necrosis of breast: Secondary | ICD-10-CM | POA: Diagnosis not present

## 2015-02-11 ENCOUNTER — Encounter: Payer: Self-pay | Admitting: Internal Medicine

## 2015-02-11 ENCOUNTER — Ambulatory Visit (INDEPENDENT_AMBULATORY_CARE_PROVIDER_SITE_OTHER): Payer: Medicare Other | Admitting: Internal Medicine

## 2015-02-11 ENCOUNTER — Other Ambulatory Visit (INDEPENDENT_AMBULATORY_CARE_PROVIDER_SITE_OTHER): Payer: Medicare Other

## 2015-02-11 VITALS — BP 122/74 | HR 80 | Temp 97.9°F | Resp 16 | Wt 126.0 lb

## 2015-02-11 DIAGNOSIS — E118 Type 2 diabetes mellitus with unspecified complications: Secondary | ICD-10-CM | POA: Diagnosis not present

## 2015-02-11 DIAGNOSIS — I10 Essential (primary) hypertension: Secondary | ICD-10-CM

## 2015-02-11 DIAGNOSIS — E038 Other specified hypothyroidism: Secondary | ICD-10-CM

## 2015-02-11 LAB — CBC WITH DIFFERENTIAL/PLATELET
Basophils Absolute: 0 10*3/uL (ref 0.0–0.1)
Basophils Relative: 0.3 % (ref 0.0–3.0)
EOS ABS: 0.1 10*3/uL (ref 0.0–0.7)
Eosinophils Relative: 1 % (ref 0.0–5.0)
HCT: 46.9 % — ABNORMAL HIGH (ref 36.0–46.0)
HEMOGLOBIN: 16 g/dL — AB (ref 12.0–15.0)
LYMPHS ABS: 3.5 10*3/uL (ref 0.7–4.0)
LYMPHS PCT: 42.6 % (ref 12.0–46.0)
MCHC: 34.2 g/dL (ref 30.0–36.0)
MCV: 87.3 fl (ref 78.0–100.0)
MONO ABS: 0.7 10*3/uL (ref 0.1–1.0)
Monocytes Relative: 8.2 % (ref 3.0–12.0)
NEUTROS PCT: 47.9 % (ref 43.0–77.0)
Neutro Abs: 4 10*3/uL (ref 1.4–7.7)
Platelets: 288 10*3/uL (ref 150.0–400.0)
RBC: 5.37 Mil/uL — ABNORMAL HIGH (ref 3.87–5.11)
RDW: 14.1 % (ref 11.5–15.5)
WBC: 8.3 10*3/uL (ref 4.0–10.5)

## 2015-02-11 LAB — BASIC METABOLIC PANEL
BUN: 16 mg/dL (ref 6–23)
CO2: 27 mEq/L (ref 19–32)
CREATININE: 0.72 mg/dL (ref 0.40–1.20)
Calcium: 11 mg/dL — ABNORMAL HIGH (ref 8.4–10.5)
Chloride: 103 mEq/L (ref 96–112)
GFR: 100.35 mL/min (ref >60.00)
Glucose, Bld: 113 mg/dL — ABNORMAL HIGH (ref 70–99)
Potassium: 3.4 mEq/L — ABNORMAL LOW (ref 3.5–5.1)
SODIUM: 138 meq/L (ref 135–145)

## 2015-02-11 LAB — TSH: TSH: 0.48 u[IU]/mL (ref 0.35–4.50)

## 2015-02-11 LAB — HEMOGLOBIN A1C: HEMOGLOBIN A1C: 6.3 % (ref 4.6–6.5)

## 2015-02-11 NOTE — Patient Instructions (Signed)
Hypothyroidism The thyroid is a large gland located in the lower front of your neck. The thyroid gland helps control metabolism. Metabolism is how your body handles food. It controls metabolism with the hormone thyroxine. When this gland is underactive (hypothyroid), it produces too little hormone.  CAUSES These include:   Absence or destruction of thyroid tissue.  Goiter due to iodine deficiency.  Goiter due to medications.  Congenital defects (since birth).  Problems with the pituitary. This causes a lack of TSH (thyroid stimulating hormone). This hormone tells the thyroid to turn out more hormone. SYMPTOMS  Lethargy (feeling as though you have no energy)  Cold intolerance  Weight gain (in spite of normal food intake)  Dry skin  Coarse hair  Menstrual irregularity (if severe, may lead to infertility)  Slowing of thought processes Cardiac problems are also caused by insufficient amounts of thyroid hormone. Hypothyroidism in the newborn is cretinism, and is an extreme form. It is important that this form be treated adequately and immediately or it will lead rapidly to retarded physical and mental development. DIAGNOSIS  To prove hypothyroidism, your caregiver may do blood tests and ultrasound tests. Sometimes the signs are hidden. It may be necessary for your caregiver to watch this illness with blood tests either before or after diagnosis and treatment. TREATMENT  Low levels of thyroid hormone are increased by using synthetic thyroid hormone. This is a safe, effective treatment. It usually takes about four weeks to gain the full effects of the medication. After you have the full effect of the medication, it will generally take another four weeks for problems to leave. Your caregiver may start you on low doses. If you have had heart problems the dose may be gradually increased. It is generally not an emergency to get rapidly to normal. HOME CARE INSTRUCTIONS   Take your  medications as your caregiver suggests. Let your caregiver know of any medications you are taking or start taking. Your caregiver will help you with dosage schedules.  As your condition improves, your dosage needs may increase. It will be necessary to have continuing blood tests as suggested by your caregiver.  Report all suspected medication side effects to your caregiver. SEEK MEDICAL CARE IF: Seek medical care if you develop:  Sweating.  Tremulousness (tremors).  Anxiety.  Rapid weight loss.  Heat intolerance.  Emotional swings.  Diarrhea.  Weakness. SEEK IMMEDIATE MEDICAL CARE IF:  You develop chest pain, an irregular heart beat (palpitations), or a rapid heart beat. MAKE SURE YOU:   Understand these instructions.  Will watch your condition.  Will get help right away if you are not doing well or get worse. Document Released: 10/12/2005 Document Revised: 01/04/2012 Document Reviewed: 06/01/2008 Exit[employer] Patient Information 2015 [employer]. This information is not intended to replace advice given to you by your health care provider. Make sure you discuss any questions you have with your health care provider.  

## 2015-02-11 NOTE — Progress Notes (Signed)
Subjective:    Patient ID: Jackie Horton, female    DOB: 10-09-1933, 81 y.o.   MRN: [id number]  Hypertension This is a chronic problem. The current episode started more than 1 year ago. The problem is unchanged. The problem is controlled. Associated symptoms include malaise/fatigue. Pertinent negatives include no anxiety, blurred vision, chest pain, headaches, neck pain, orthopnea, palpitations, peripheral edema, PND, shortness of breath or sweats. There are no associated agents to hypertension. Risk factors for coronary artery disease include smoking/tobacco exposure. Past treatments include calcium channel blockers, angiotensin blockers and diuretics. The current treatment provides significant improvement. There are no compliance problems.       Review of Systems  Constitutional: Positive for malaise/fatigue and fatigue. Negative for fever, chills, diaphoresis and appetite change.  HENT: Negative.  Negative for trouble swallowing.   Eyes: Negative.  Negative for blurred vision.  Respiratory: Negative.  Negative for apnea, cough, choking, chest tightness, shortness of breath and stridor.   Cardiovascular: Negative.  Negative for chest pain, palpitations, orthopnea, leg swelling and PND.  Gastrointestinal: Negative.  Negative for nausea, abdominal pain, diarrhea, constipation and blood in stool.  Endocrine: Negative.   Genitourinary: Negative.   Musculoskeletal: Negative.  Negative for back pain, joint swelling, arthralgias and neck pain.  Skin: Negative.  Negative for rash.  Allergic/Immunologic: Negative.   Neurological: Negative.  Negative for dizziness, tremors, weakness, light-headedness and headaches.  Hematological: Negative.  Negative for adenopathy. Does not bruise/bleed easily.  Psychiatric/Behavioral: Negative.        Objective:   Physical Exam  Constitutional: She is oriented to person, place, and time. She appears well-developed and well-nourished. No distress.    HENT:  Head: Normocephalic and atraumatic.  Mouth/Throat: Oropharynx is clear and moist. No oropharyngeal exudate.  Eyes: Conjunctivae are normal. Right eye exhibits no discharge. Left eye exhibits no discharge. No scleral icterus.  Neck: Normal range of motion. Neck supple. No JVD present. No tracheal deviation present. No thyromegaly present.  Cardiovascular: Normal rate, regular rhythm, normal heart sounds and intact distal pulses.  Exam reveals no gallop and no friction rub.   No murmur heard. Pulmonary/Chest: Effort normal and breath sounds normal. No stridor. No respiratory distress. She has no wheezes. She has no rales. She exhibits no tenderness.  Abdominal: Soft. Bowel sounds are normal. She exhibits no distension and no mass. There is no tenderness. There is no rebound and no guarding.  Musculoskeletal: Normal range of motion. She exhibits no edema or tenderness.  Lymphadenopathy:    She has no cervical adenopathy.  Neurological: She is oriented to person, place, and time.  Skin: Skin is warm and dry. No rash noted. She is not diaphoretic. No erythema. No pallor.  Psychiatric: She has a normal mood and affect. Her behavior is normal. Judgment and thought content normal.  Vitals reviewed.     Lab Results  Component Value Date   WBC 8.5 06/13/2014   HGB 15.3* 06/13/2014   HCT 46.1* 06/13/2014   PLT 245.0 06/13/2014   GLUCOSE 94 10/08/2014   CHOL 124 06/13/2014   TRIG 100.0 06/13/2014   HDL 37.70* 06/13/2014   LDLCALC 66 06/13/2014   ALT 27 06/13/2014   AST 21 06/13/2014   NA 138 10/08/2014   K 3.6 10/08/2014   CL 105 10/08/2014   CREATININE 0.7 10/08/2014   BUN 15 10/08/2014   CO2 25 10/08/2014   TSH 0.13* 10/08/2014   INR 1.0 05/16/2009   HGBA1C 6.5 10/08/2014  Assessment & Plan:

## 2015-02-11 NOTE — Assessment & Plan Note (Signed)
Her blood sugars are well controlled She will cont to work on her lifestyle modifications

## 2015-02-11 NOTE — Assessment & Plan Note (Signed)
Her TSh is in the normal range She will stay on the current dose

## 2015-02-11 NOTE — Progress Notes (Signed)
Pre visit review using our clinic review tool, if applicable. No additional management support is needed unless otherwise documented below in the visit note. 

## 2015-02-11 NOTE — Assessment & Plan Note (Signed)
Her BP is well controlled Lytes and renal function are stable 

## 2015-02-12 ENCOUNTER — Telehealth: Payer: Self-pay | Admitting: Internal Medicine

## 2015-02-12 NOTE — Telephone Encounter (Signed)
Slightly low potassium Slightly high calcium Red blood cells are elevated due to smoking Blood sugars are stable but slightly elevated  Will recheck these labs next time

## 2015-02-12 NOTE — Telephone Encounter (Signed)
Notified pt with md response.../lmb 

## 2015-02-12 NOTE — Telephone Encounter (Signed)
Patient would like call back with lab results

## 2015-02-13 NOTE — Addendum Note (Signed)
Addended by: Janith Lima on: 02/13/2015 04:54 PM   Modules accepted: Miquel Dunn

## 2015-02-25 ENCOUNTER — Encounter: Payer: Self-pay | Admitting: Internal Medicine

## 2015-02-27 ENCOUNTER — Ambulatory Visit (INDEPENDENT_AMBULATORY_CARE_PROVIDER_SITE_OTHER): Payer: Medicare Other | Admitting: Ophthalmology

## 2015-02-27 DIAGNOSIS — H43813 Vitreous degeneration, bilateral: Secondary | ICD-10-CM | POA: Diagnosis not present

## 2015-02-27 DIAGNOSIS — I10 Essential (primary) hypertension: Secondary | ICD-10-CM

## 2015-02-27 DIAGNOSIS — H35033 Hypertensive retinopathy, bilateral: Secondary | ICD-10-CM

## 2015-02-27 DIAGNOSIS — H3531 Nonexudative age-related macular degeneration: Secondary | ICD-10-CM

## 2015-03-04 ENCOUNTER — Encounter: Payer: Self-pay | Admitting: Internal Medicine

## 2015-04-08 ENCOUNTER — Other Ambulatory Visit: Payer: Self-pay | Admitting: Internal Medicine

## 2015-05-03 ENCOUNTER — Other Ambulatory Visit: Payer: Self-pay | Admitting: Internal Medicine

## 2015-06-10 ENCOUNTER — Encounter: Payer: Self-pay | Admitting: Internal Medicine

## 2015-06-10 ENCOUNTER — Ambulatory Visit (INDEPENDENT_AMBULATORY_CARE_PROVIDER_SITE_OTHER): Payer: Medicare Other | Admitting: Internal Medicine

## 2015-06-10 ENCOUNTER — Other Ambulatory Visit (INDEPENDENT_AMBULATORY_CARE_PROVIDER_SITE_OTHER): Payer: Medicare Other

## 2015-06-10 VITALS — BP 130/80 | HR 90 | Temp 98.1°F | Ht 62.0 in | Wt 129.0 lb

## 2015-06-10 DIAGNOSIS — H6122 Impacted cerumen, left ear: Secondary | ICD-10-CM | POA: Diagnosis not present

## 2015-06-10 DIAGNOSIS — E038 Other specified hypothyroidism: Secondary | ICD-10-CM

## 2015-06-10 DIAGNOSIS — E7800 Pure hypercholesterolemia, unspecified: Secondary | ICD-10-CM

## 2015-06-10 DIAGNOSIS — I10 Essential (primary) hypertension: Secondary | ICD-10-CM

## 2015-06-10 DIAGNOSIS — E118 Type 2 diabetes mellitus with unspecified complications: Secondary | ICD-10-CM

## 2015-06-10 DIAGNOSIS — H6120 Impacted cerumen, unspecified ear: Secondary | ICD-10-CM | POA: Insufficient documentation

## 2015-06-10 LAB — CBC WITH DIFFERENTIAL/PLATELET
BASOS ABS: 0.1 10*3/uL (ref 0.0–0.1)
Basophils Relative: 1 % (ref 0.0–3.0)
EOS ABS: 0.1 10*3/uL (ref 0.0–0.7)
Eosinophils Relative: 0.9 % (ref 0.0–5.0)
HEMATOCRIT: 45.1 % (ref 36.0–46.0)
HEMOGLOBIN: 15.1 g/dL — AB (ref 12.0–15.0)
LYMPHS PCT: 43.2 % (ref 12.0–46.0)
Lymphs Abs: 3.6 10*3/uL (ref 0.7–4.0)
MCHC: 33.6 g/dL (ref 30.0–36.0)
MCV: 92 fl (ref 78.0–100.0)
Monocytes Absolute: 0.6 10*3/uL (ref 0.1–1.0)
Monocytes Relative: 7.5 % (ref 3.0–12.0)
NEUTROS ABS: 4 10*3/uL (ref 1.4–7.7)
Neutrophils Relative %: 47.4 % (ref 43.0–77.0)
PLATELETS: 274 10*3/uL (ref 150.0–400.0)
RBC: 4.9 Mil/uL (ref 3.87–5.11)
RDW: 13.8 % (ref 11.5–15.5)
WBC: 8.4 10*3/uL (ref 4.0–10.5)

## 2015-06-10 LAB — BASIC METABOLIC PANEL
BUN: 13 mg/dL (ref 6–23)
CALCIUM: 10.6 mg/dL — AB (ref 8.4–10.5)
CO2: 26 mEq/L (ref 19–32)
Chloride: 104 mEq/L (ref 96–112)
Creatinine, Ser: 0.71 mg/dL (ref 0.40–1.20)
GFR: 101.88 mL/min (ref >60.00)
Glucose, Bld: 107 mg/dL — ABNORMAL HIGH (ref 70–99)
Potassium: 3.5 mEq/L (ref 3.5–5.1)
SODIUM: 137 meq/L (ref 135–145)

## 2015-06-10 LAB — LIPID PANEL
Cholesterol: 159 mg/dL (ref 0–200)
HDL: 36.4 mg/dL — ABNORMAL LOW (ref >39.00)
LDL CALC: 102 mg/dL — AB (ref 0–99)
NONHDL: 122.32
Total CHOL/HDL Ratio: 4
Triglycerides: 102 mg/dL (ref 0.0–149.0)
VLDL: 20.4 mg/dL (ref 0.0–40.0)

## 2015-06-10 LAB — HEMOGLOBIN A1C: Hgb A1c MFr Bld: 6.5 % (ref 4.6–6.5)

## 2015-06-10 LAB — TSH: TSH: 3.4 u[IU]/mL (ref 0.35–4.50)

## 2015-06-10 NOTE — Patient Instructions (Signed)

## 2015-06-10 NOTE — Progress Notes (Signed)
Subjective:  Patient ID: Jackie Horton, female    DOB: Sep 11, 1933  Age: 81 y.o. MRN: [id number]  CC: Hypothyroidism; Hypertension; and Diabetes   HPI Jackie Horton presents for follow-up on medical illnesses She also complains that she has decreased hearing in her left ear and wants wax removed from her left ear. She offers no other complaints.  Outpatient Prescriptions Prior to Visit  Medication Sig Dispense Refill  . amLODipine (NORVASC) 10 MG tablet TAKE ONE TABLET BY MOUTH ONCE DAILY 90 tablet 3  . aspirin 81 MG tablet Take 81 mg by mouth daily.    Marland Kitchen levothyroxine (SYNTHROID, LEVOTHROID) 50 MCG tablet TAKE ONE TABLET BY MOUTH ONCE DAILY 90 tablet 1  . losartan (COZAAR) 50 MG tablet Take 1 tablet (50 mg total) by mouth daily. 90 tablet 3  . magnesium hydroxide (MILK OF MAGNESIA) 400 MG/5ML suspension Take 15 mLs by mouth daily as needed for constipation.    . Multiple Vitamins-Minerals (CENTRUM SILVER ULTRA WOMENS) TABS Take 1 tablet by mouth daily.    . potassium chloride (K-DUR) 10 MEQ tablet TAKE ONE TABLET EACH DAY 90 tablet 1  . pravastatin (PRAVACHOL) 20 MG tablet Take 1 tablet (20 mg total) by mouth daily. 90 tablet 3  . tamoxifen (NOLVADEX) 20 MG tablet TAKE ONE TABLET EACH DAY 90 tablet 4  . triamterene-hydrochlorothiazide (MAXZIDE-25) 37.5-25 MG per tablet TAKE ONE-HALF TABLET DAILY 45 tablet 0   No facility-administered medications prior to visit.    ROS Review of Systems  Constitutional: Negative.  Negative for fever, chills, diaphoresis, appetite change and fatigue.  HENT: Positive for hearing loss. Negative for ear discharge, ear pain, sore throat, trouble swallowing and voice change.   Eyes: Negative.   Respiratory: Negative.  Negative for cough, choking, chest tightness, shortness of breath and stridor.   Cardiovascular: Negative.  Negative for chest pain, palpitations and leg swelling.  Gastrointestinal: Negative.  Negative for nausea, vomiting, abdominal  pain, diarrhea, constipation and blood in stool.  Endocrine: Negative.   Genitourinary: Negative.  Negative for difficulty urinating.  Musculoskeletal: Negative.   Skin: Negative.   Allergic/Immunologic: Negative.   Neurological: Negative.  Negative for dizziness, tremors, weakness, light-headedness and headaches.  Hematological: Negative.  Negative for adenopathy. Does not bruise/bleed easily.  Psychiatric/Behavioral: Negative.     Objective:  BP 130/80 mmHg  Pulse 90  Temp(Src) 98.1 F (36.7 C) (Oral)  Ht 5\' 2"  (1.575 m)  Wt 129 lb (58.514 kg)  BMI 23.59 kg/m2  SpO2 98%  BP Readings from Last 3 Encounters:  06/10/15 130/80  02/11/15 122/74  10/08/14 124/80    Wt Readings from Last 3 Encounters:  06/10/15 129 lb (58.514 kg)  02/11/15 126 lb (57.153 kg)  10/08/14 127 lb (57.607 kg)    Physical Exam  Constitutional: She is oriented to person, place, and time. She appears well-nourished. No distress.  HENT:  Head: Normocephalic and atraumatic.  Right Ear: Hearing, tympanic membrane, external ear and ear canal normal.  Left Ear: Hearing, tympanic membrane and external ear normal. A foreign body (cerumen impaction) is present.  Mouth/Throat: Oropharynx is clear and moist. No oropharyngeal exudate.  I put Colace in her left external auditory canal. I then irrigated it and used an ear pick to remove a large ball of wax. She tolerated this well. The exam afterwards shows that her external auditory canal is normal, the tympanic membrane is normal, there are no signs of trauma.  Eyes: Conjunctivae are normal. Right eye exhibits  no discharge. Left eye exhibits no discharge. No scleral icterus.  Neck: Normal range of motion. Neck supple. No JVD present. No tracheal deviation present. No thyromegaly present.  Cardiovascular: Normal rate, regular rhythm, normal heart sounds and intact distal pulses.  Exam reveals no gallop and no friction rub.   No murmur heard. Pulmonary/Chest:  Effort normal and breath sounds normal. No stridor. No respiratory distress. She has no wheezes. She has no rales. She exhibits no tenderness.  Abdominal: Soft. Bowel sounds are normal. She exhibits no distension and no mass. There is no tenderness. There is no rebound and no guarding.  Musculoskeletal: Normal range of motion. She exhibits no edema or tenderness.  Lymphadenopathy:    She has no cervical adenopathy.  Neurological: She is oriented to person, place, and time.  Skin: Skin is warm and dry. No rash noted. She is not diaphoretic. No erythema. No pallor.  Psychiatric: She has a normal mood and affect. Her behavior is normal. Judgment and thought content normal.    Lab Results  Component Value Date   WBC 8.4 06/10/2015   HGB 15.1* 06/10/2015   HCT 45.1 06/10/2015   PLT 274.0 06/10/2015   GLUCOSE 107* 06/10/2015   CHOL 159 06/10/2015   TRIG 102.0 06/10/2015   HDL 36.40* 06/10/2015   LDLCALC 102* 06/10/2015   ALT 27 06/13/2014   AST 21 06/13/2014   NA 137 06/10/2015   K 3.5 06/10/2015   CL 104 06/10/2015   CREATININE 0.71 06/10/2015   BUN 13 06/10/2015   CO2 26 06/10/2015   TSH 3.40 06/10/2015   INR 1.0 05/16/2009   HGBA1C 6.5 06/10/2015    Dg Chest 2 View  06/18/2014   CLINICAL DATA:  Fatigue.  Hypertension.  EXAM: CHEST  2 VIEW  COMPARISON:  06/21/2013.  FINDINGS: The cardiac silhouette, mediastinal and hilar contours are within normal limits and stable. The lungs demonstrate chronic bronchitic changes but no acute pulmonary findings. No pleural effusion. The bony thorax is intact.  IMPRESSION: No acute cardiopulmonary findings.   Electronically Signed   By: Kalman Jewels M.D.   On: 06/18/2014 15:04    Assessment & Plan:   Jackie Horton was seen today for hypothyroidism, hypertension and diabetes.  Diagnoses and all orders for this visit:  Essential hypertension- her blood pressures well controlled, her electrolytes and renal function are normal. -     Lipid panel;  Future -     CBC with Differential/Platelet; Future  Other specified hypothyroidism- her TSH is in the normal range, she will stay on the current dose of levothyroxine. -     Lipid panel; Future -     TSH; Future  Type II diabetes mellitus with manifestations- her blood sugars are adequately well controlled, she does not need a medication for this at this time. -     Basic metabolic panel; Future -     Hemoglobin A1c; Future  Hypercholesteremia- she has achieved her LDL goal and is doing well on the statin. -     Lipid panel; Future   I am having Ms. Locy maintain her magnesium hydroxide, aspirin, CENTRUM SILVER ULTRA WOMENS, tamoxifen, losartan, amLODipine, triamterene-hydrochlorothiazide, pravastatin, levothyroxine, and potassium chloride.  No orders of the defined types were placed in this encounter.     Follow-up: Return in about 4 months (around 10/10/2015).  Scarlette Calico, MD

## 2015-06-10 NOTE — Progress Notes (Signed)
Pre visit review using our clinic review tool, if applicable. No additional management support is needed unless otherwise documented below in the visit note. 

## 2015-06-11 ENCOUNTER — Encounter: Payer: Self-pay | Admitting: Internal Medicine

## 2015-06-11 ENCOUNTER — Other Ambulatory Visit: Payer: Self-pay | Admitting: Internal Medicine

## 2015-07-07 ENCOUNTER — Other Ambulatory Visit: Payer: Self-pay | Admitting: Internal Medicine

## 2015-07-08 ENCOUNTER — Other Ambulatory Visit: Payer: Self-pay | Admitting: Pulmonary Disease

## 2015-08-07 ENCOUNTER — Other Ambulatory Visit: Payer: Self-pay | Admitting: Internal Medicine

## 2015-08-15 ENCOUNTER — Other Ambulatory Visit: Payer: Self-pay | Admitting: Internal Medicine

## 2015-10-09 LAB — HM DIABETES EYE EXAM

## 2015-10-11 ENCOUNTER — Other Ambulatory Visit: Payer: Self-pay | Admitting: Internal Medicine

## 2015-10-14 ENCOUNTER — Other Ambulatory Visit: Payer: Self-pay | Admitting: Internal Medicine

## 2015-10-14 ENCOUNTER — Ambulatory Visit (INDEPENDENT_AMBULATORY_CARE_PROVIDER_SITE_OTHER): Payer: Medicare Other | Admitting: Internal Medicine

## 2015-10-14 ENCOUNTER — Encounter: Payer: Self-pay | Admitting: Internal Medicine

## 2015-10-14 VITALS — BP 130/82 | HR 86 | Temp 97.7°F | Resp 16 | Ht 62.0 in | Wt 129.0 lb

## 2015-10-14 DIAGNOSIS — Z23 Encounter for immunization: Secondary | ICD-10-CM | POA: Diagnosis not present

## 2015-10-14 DIAGNOSIS — D170 Benign lipomatous neoplasm of skin and subcutaneous tissue of head, face and neck: Secondary | ICD-10-CM | POA: Diagnosis not present

## 2015-10-14 DIAGNOSIS — R229 Localized swelling, mass and lump, unspecified: Secondary | ICD-10-CM | POA: Diagnosis not present

## 2015-10-14 LAB — HM COLONOSCOPY

## 2015-10-14 NOTE — Addendum Note (Signed)
Addended by: Levonne Lapping on: 10/14/2015 01:16 PM   Modules accepted: Orders

## 2015-10-14 NOTE — Patient Instructions (Signed)
Wound Care °Taking care of your wound properly can help to prevent pain and infection. It can also help your wound to heal more quickly.  °HOW TO CARE FOR YOUR WOUND  °· Take or apply over-the-counter and prescription medicines only as told by your health care provider. °· If you were prescribed antibiotic medicine, take or apply it as told by your health care provider. Do not stop using the antibiotic even if your condition improves. °· Clean the wound each day or as told by your health care provider. °¨ Wash the wound with mild soap and water. °¨ Rinse the wound with water to remove all soap. °¨ Pat the wound dry with a clean towel. Do not rub it. °· There are many different ways to close and cover a wound. For example, a wound can be covered with stitches (sutures), skin glue, or adhesive strips. Follow instructions from your health care provider about: °¨ How to take care of your wound. °¨ When and how you should change your bandage (dressing). °¨ When you should remove your dressing. °¨ Removing whatever was used to close your wound. °· Check your wound every day for signs of infection. Watch for: °¨ Redness, swelling, or pain. °¨ Fluid, blood, or pus. °· Keep the dressing dry until your health care provider says it can be removed. Do not take baths, swim, use a hot tub, or do anything that would put your wound underwater until your health care provider approves. °· Raise (elevate) the injured area above the level of your heart while you are sitting or lying down. °· Do not scratch or pick at the wound. °· Keep all follow-up visits as told by your health care provider. This is important. °SEEK MEDICAL CARE IF: °· You received a tetanus shot and you have swelling, severe pain, redness, or bleeding at the injection site. °· You have a fever. °· Your pain is not controlled with medicine. °· You have increased redness, swelling, or pain at the site of your wound. °· You have fluid, blood, or pus coming from your  wound. °· You notice a bad smell coming from your wound or your dressing. °SEEK IMMEDIATE MEDICAL CARE IF: °· You have a red streak going away from your wound. °  °This information is not intended to replace advice given to you by your health care provider. Make sure you discuss any questions you have with your health care provider. °  °Document Released: 07/21/2008 Document Revised: 02/26/2015 Document Reviewed: 10/08/2014 °Elsevier Interactive Patient Education ©2016 [employer] ° °

## 2015-10-14 NOTE — Progress Notes (Signed)
Subjective:  Patient ID: Jackie Horton, female    DOB: Oct 05, 1933  Age: 81 y.o. MRN: [id number]  CC: Rash   HPI Jackie Horton presents for a concern about a lesion on the back of her neck. She says is been there for many years but she is concerned that it is growing and becoming symptomatic. It does not bleed.  Outpatient Prescriptions Prior to Visit  Medication Sig Dispense Refill  . amLODipine (NORVASC) 10 MG tablet TAKE ONE TABLET EACH DAY 90 tablet 3  . aspirin 81 MG tablet Take 81 mg by mouth daily.    Marland Kitchen levothyroxine (SYNTHROID, LEVOTHROID) 50 MCG tablet TAKE ONE TABLET BY MOUTH ONCE DAILY 90 tablet 1  . losartan (COZAAR) 50 MG tablet TAKE ONE TABLET BY MOUTH ONCE DAILY 90 tablet 3  . magnesium hydroxide (MILK OF MAGNESIA) 400 MG/5ML suspension Take 15 mLs by mouth daily as needed for constipation.    . Multiple Vitamins-Minerals (CENTRUM SILVER ULTRA WOMENS) TABS Take 1 tablet by mouth daily.    . potassium chloride (K-DUR) 10 MEQ tablet TAKE ONE TABLET EACH DAY 90 tablet 1  . pravastatin (PRAVACHOL) 20 MG tablet Take 1 tablet (20 mg total) by mouth daily. 90 tablet 3  . tamoxifen (NOLVADEX) 20 MG tablet TAKE ONE TABLET EACH DAY 90 tablet 4  . triamterene-hydrochlorothiazide (MAXZIDE-25) 37.5-25 MG per tablet TAKE ONE-HALF TABLET DAILY 45 tablet 11   No facility-administered medications prior to visit.    ROS Review of Systems  All other systems reviewed and are negative.   Objective:  BP 130/82 mmHg  Pulse 100  Temp(Src) 97.7 F (36.5 C) (Oral)  Ht 5\' 2"  (1.575 m)  Wt 129 lb (58.514 kg)  BMI 23.59 kg/m2  SpO2 99%  BP Readings from Last 3 Encounters:  10/14/15 130/82  06/10/15 130/80  02/11/15 122/74    Wt Readings from Last 3 Encounters:  10/14/15 129 lb (58.514 kg)  06/10/15 129 lb (58.514 kg)  02/11/15 126 lb (57.153 kg)    Physical Exam  Musculoskeletal:       Back:  The lesion was cleaned with Betadine. The area around the base of the  stalk was anesthetized with 2% lidocaine with epinephrine requiring about 1 mL. Next a shave incision/biopsy was performed. The lesion was removed and sent for pathology. Hemostasis was obtained with the application of silver nitrate sticks. The area was covered with Neosporin and a dressing. There were no complications or bleeding.    Lab Results  Component Value Date   WBC 8.4 06/10/2015   HGB 15.1* 06/10/2015   HCT 45.1 06/10/2015   PLT 274.0 06/10/2015   GLUCOSE 107* 06/10/2015   CHOL 159 06/10/2015   TRIG 102.0 06/10/2015   HDL 36.40* 06/10/2015   LDLCALC 102* 06/10/2015   ALT 27 06/13/2014   AST 21 06/13/2014   NA 137 06/10/2015   K 3.5 06/10/2015   CL 104 06/10/2015   CREATININE 0.71 06/10/2015   BUN 13 06/10/2015   CO2 26 06/10/2015   TSH 3.40 06/10/2015   INR 1.0 05/16/2009   HGBA1C 6.5 06/10/2015    Dg Chest 2 View  06/18/2014  CLINICAL DATA:  Fatigue.  Hypertension. EXAM: CHEST  2 VIEW COMPARISON:  06/21/2013. FINDINGS: The cardiac silhouette, mediastinal and hilar contours are within normal limits and stable. The lungs demonstrate chronic bronchitic changes but no acute pulmonary findings. No pleural effusion. The bony thorax is intact. IMPRESSION: No acute cardiopulmonary findings. Electronically Signed  By: Kalman Jewels M.D.   On: 06/18/2014 15:04    Assessment & Plan:   Tallis was seen today for rash.  Diagnoses and all orders for this visit:  Encounter for immunization  Skin mass- this appears to be a skin tag or possibly a lipoma, I await the pathology results to be certain that there is not a malignant process. She will continue to provide wound care with Neosporin and a Band-Aid twice a day. She will return in approximately one week for reevaluation of the area in the meantime she will let me know if there are any signs of bleeding or wound infection.  Other orders -     Flu vaccine HIGH DOSE PF   I am having Ms. Fleischhacker maintain her magnesium  hydroxide, aspirin, CENTRUM SILVER ULTRA WOMENS, tamoxifen, pravastatin, potassium chloride, triamterene-hydrochlorothiazide, amLODipine, losartan, and levothyroxine.  No orders of the defined types were placed in this encounter.     Follow-up: Return in about 1 week (around 10/21/2015).  Scarlette Calico, MD

## 2015-10-14 NOTE — Progress Notes (Signed)
Pre visit review using our clinic review tool, if applicable. No additional management support is needed unless otherwise documented below in the visit note. 

## 2015-11-07 ENCOUNTER — Other Ambulatory Visit: Payer: Self-pay | Admitting: Internal Medicine

## 2015-12-16 ENCOUNTER — Other Ambulatory Visit (INDEPENDENT_AMBULATORY_CARE_PROVIDER_SITE_OTHER): Payer: Medicare Other

## 2015-12-16 ENCOUNTER — Ambulatory Visit (INDEPENDENT_AMBULATORY_CARE_PROVIDER_SITE_OTHER): Payer: Medicare Other | Admitting: Internal Medicine

## 2015-12-16 ENCOUNTER — Encounter: Payer: Self-pay | Admitting: Internal Medicine

## 2015-12-16 VITALS — BP 118/80 | HR 100 | Temp 97.5°F | Resp 16 | Ht 62.0 in | Wt 129.0 lb

## 2015-12-16 DIAGNOSIS — I10 Essential (primary) hypertension: Secondary | ICD-10-CM

## 2015-12-16 DIAGNOSIS — E118 Type 2 diabetes mellitus with unspecified complications: Secondary | ICD-10-CM

## 2015-12-16 DIAGNOSIS — E890 Postprocedural hypothyroidism: Secondary | ICD-10-CM

## 2015-12-16 DIAGNOSIS — C50819 Malignant neoplasm of overlapping sites of unspecified female breast: Secondary | ICD-10-CM

## 2015-12-16 LAB — URINALYSIS, ROUTINE W REFLEX MICROSCOPIC
Bilirubin Urine: NEGATIVE
Hgb urine dipstick: NEGATIVE
KETONES UR: NEGATIVE
Leukocytes, UA: NEGATIVE
Nitrite: NEGATIVE
PH: 7.5 (ref 5.0–8.0)
SPECIFIC GRAVITY, URINE: 1.01 (ref 1.000–1.030)
Total Protein, Urine: NEGATIVE
Urine Glucose: NEGATIVE
Urobilinogen, UA: 0.2 (ref 0.0–1.0)
WBC UA: NONE SEEN (ref 0–?)

## 2015-12-16 LAB — MICROALBUMIN / CREATININE URINE RATIO
CREATININE, U: 55 mg/dL
MICROALB/CREAT RATIO: 1.3 mg/g (ref 0.0–30.0)
Microalb, Ur: <0.7 mg/dL (ref 0.0–1.9)

## 2015-12-16 LAB — VITAMIN D 25 HYDROXY (VIT D DEFICIENCY, FRACTURES): VITD: 59.29 ng/mL (ref 30.00–100.00)

## 2015-12-16 LAB — BASIC METABOLIC PANEL
BUN: 18 mg/dL (ref 6–23)
CALCIUM: 11.2 mg/dL — AB (ref 8.4–10.5)
CO2: 25 mEq/L (ref 19–32)
CREATININE: 0.78 mg/dL (ref 0.40–1.20)
Chloride: 102 mEq/L (ref 96–112)
GFR: 91.29 mL/min (ref >60.00)
GLUCOSE: 105 mg/dL — AB (ref 70–99)
POTASSIUM: 3.8 meq/L (ref 3.5–5.1)
Sodium: 137 mEq/L (ref 135–145)

## 2015-12-16 LAB — TSH: TSH: 2.54 u[IU]/mL (ref 0.35–4.50)

## 2015-12-16 LAB — PHOSPHORUS: PHOSPHORUS: 2.2 mg/dL — AB (ref 2.3–4.6)

## 2015-12-16 LAB — HEMOGLOBIN A1C: HEMOGLOBIN A1C: 6.5 % (ref 4.6–6.5)

## 2015-12-16 NOTE — Patient Instructions (Signed)

## 2015-12-16 NOTE — Progress Notes (Signed)
Pre visit review using our clinic review tool, if applicable. No additional management support is needed unless otherwise documented below in the visit note. 

## 2015-12-16 NOTE — Progress Notes (Signed)
Subjective:  Patient ID: Jackie Horton, female    DOB: [birth year]  Age: 82 y.o. MRN: [id number]  CC: Hypothyroidism; Hypertension; and Diabetes   HPI Jackie Horton presents for follow-up on several medical problems. Review of systems is positive for a few recent episodes of weakness and fatigue. She also complains of intermittent constipation. She denies cough, shortness of breath, chest pain, dyspnea on exertion, edema, or diarrhea.  Outpatient Prescriptions Prior to Visit  Medication Sig Dispense Refill  . amLODipine (NORVASC) 10 MG tablet TAKE ONE TABLET EACH DAY 90 tablet 3  . aspirin 81 MG tablet Take 81 mg by mouth daily.    Marland Kitchen losartan (COZAAR) 50 MG tablet TAKE ONE TABLET BY MOUTH ONCE DAILY 90 tablet 3  . magnesium hydroxide (MILK OF MAGNESIA) 400 MG/5ML suspension Take 15 mLs by mouth daily as needed for constipation.    . Multiple Vitamins-Minerals (CENTRUM SILVER ULTRA WOMENS) TABS Take 1 tablet by mouth daily.    . potassium chloride (K-DUR) 10 MEQ tablet TAKE ONE TABLET EACH DAY 90 tablet 3  . pravastatin (PRAVACHOL) 20 MG tablet Take 1 tablet (20 mg total) by mouth daily. 90 tablet 3  . tamoxifen (NOLVADEX) 20 MG tablet TAKE ONE TABLET EACH DAY 90 tablet 4  . triamterene-hydrochlorothiazide (MAXZIDE-25) 37.5-25 MG per tablet TAKE ONE-HALF TABLET DAILY 45 tablet 11  . levothyroxine (SYNTHROID, LEVOTHROID) 50 MCG tablet TAKE ONE TABLET BY MOUTH ONCE DAILY 90 tablet 1   No facility-administered medications prior to visit.    ROS Review of Systems  Constitutional: Positive for fatigue. Negative for fever, chills, diaphoresis, appetite change and unexpected weight change.  HENT: Negative.   Eyes: Negative.   Respiratory: Negative.  Negative for cough, choking, chest tightness, shortness of breath and stridor.   Cardiovascular: Negative.  Negative for chest pain, palpitations and leg swelling.  Gastrointestinal: Positive for constipation. Negative for nausea,  vomiting, abdominal pain, diarrhea and blood in stool.  Endocrine: Negative.   Genitourinary: Negative.  Negative for dysuria, urgency, hematuria, decreased urine volume, enuresis and difficulty urinating.  Musculoskeletal: Negative.  Negative for myalgias, back pain, joint swelling and arthralgias.  Skin: Negative.  Negative for color change and rash.  Allergic/Immunologic: Negative.   Neurological: Negative.  Negative for dizziness, tremors, speech difficulty, weakness, light-headedness, numbness and headaches.  Hematological: Negative.  Negative for adenopathy. Does not bruise/bleed easily.  Psychiatric/Behavioral: Negative.     Objective:  BP 118/80 mmHg  Pulse 100  Temp(Src) 97.5 F (36.4 C) (Oral)  Resp 16  Ht 5\' 2"  (1.575 m)  Wt 129 lb (58.514 kg)  BMI 23.59 kg/m2  SpO2 98%  BP Readings from Last 3 Encounters:  12/16/15 118/80  10/14/15 130/82  06/10/15 130/80    Wt Readings from Last 3 Encounters:  12/16/15 129 lb (58.514 kg)  10/14/15 129 lb (58.514 kg)  06/10/15 129 lb (58.514 kg)    Physical Exam  Constitutional: She is oriented to person, place, and time. She appears well-developed and well-nourished. No distress.  HENT:  Head: Normocephalic and atraumatic.  Mouth/Throat: Oropharynx is clear and moist. No oropharyngeal exudate.  Eyes: Conjunctivae are normal. Right eye exhibits no discharge. Left eye exhibits no discharge. No scleral icterus.  Neck: Normal range of motion. Neck supple. No JVD present. No tracheal deviation present. No thyromegaly present.  Cardiovascular: Normal rate, regular rhythm, normal heart sounds and intact distal pulses.  Exam reveals no gallop and no friction rub.   No murmur heard. EKG -  Sinus  Rhythm  -Left atrial enlargement.   BORDERLINE  Pulmonary/Chest: Effort normal and breath sounds normal. No stridor. No respiratory distress. She has no wheezes. She has no rales. She exhibits no tenderness.  Abdominal: Soft. Bowel  sounds are normal. She exhibits no distension and no mass. There is no tenderness. There is no rebound and no guarding.  Musculoskeletal: Normal range of motion. She exhibits no edema or tenderness.  Lymphadenopathy:    She has no cervical adenopathy.  Neurological: She is oriented to person, place, and time.  Skin: Skin is warm and dry. No rash noted. She is not diaphoretic. No erythema. No pallor.  Psychiatric: She has a normal mood and affect. Her behavior is normal. Judgment and thought content normal.  Vitals reviewed.   Lab Results  Component Value Date   WBC 8.4 06/10/2015   HGB 15.1* 06/10/2015   HCT 45.1 06/10/2015   PLT 274.0 06/10/2015   GLUCOSE 105* 12/16/2015   CHOL 159 06/10/2015   TRIG 102.0 06/10/2015   HDL 36.40* 06/10/2015   LDLCALC 102* 06/10/2015   ALT 27 06/13/2014   AST 21 06/13/2014   NA 137 12/16/2015   K 3.8 12/16/2015   CL 102 12/16/2015   CREATININE 0.78 12/16/2015   BUN 18 12/16/2015   CO2 25 12/16/2015   TSH 2.54 12/16/2015   INR 1.0 05/16/2009   HGBA1C 6.5 12/16/2015   MICROALBUR <0.7 12/16/2015    Dg Chest 2 View  06/18/2014  CLINICAL DATA:  Fatigue.  Hypertension. EXAM: CHEST  2 VIEW COMPARISON:  06/21/2013. FINDINGS: The cardiac silhouette, mediastinal and hilar contours are within normal limits and stable. The lungs demonstrate chronic bronchitic changes but no acute pulmonary findings. No pleural effusion. The bony thorax is intact. IMPRESSION: No acute cardiopulmonary findings. Electronically Signed   By: Kalman Jewels M.D.   On: 06/18/2014 15:04    Assessment & Plan:   [name] was seen today for hypothyroidism, hypertension and diabetes.  Diagnoses and all orders for this visit:  Essential hypertension- her blood pressures well controlled, renal function is stable. -     Basic metabolic panel; Future -     Urinalysis, Routine w reflex microscopic (not at [facility]); Future -     EKG 12-Lead  Postoperative hypothyroidism- her TSH is  in the normal range, she will stay on the current dose of Synthroid. -     TSH; Future -     levothyroxine (SYNTHROID, LEVOTHROID) 50 MCG tablet; Take 1 tablet (50 mcg total) by mouth daily.  Type 2 diabetes mellitus with complication, without long-term current use of insulin (HCC) -     Hemoglobin A1c; Future -     Microalbumin / creatinine urine ratio; Future  Hypercalcemia- she has had a steady increase in her calcium level over the last 9 months. Her vitamin D and PTH levels are normal. Her phosphorus is a little low. I think this is caused by her thiazide diuretic therapy but I'm also slightly concerned that there may be a humoral cause. She does have a history of tobacco abuse so I have asked her to have a chest x-ray done to see if there is any concern for lung cancer. -     Basic metabolic panel; Future -     Phosphorus; Future -     PTH, intact and calcium; Future -     VITAMIN D 25 Hydroxy (Vit-D Deficiency, Fractures); Future -     DG Chest 2 View; Future  Overlapping malignant neoplasm of female breast, unspecified laterality ([place]) -     DG Chest 2 View; Future   I have changed Ms. Barrell's levothyroxine. I am also having her maintain her magnesium hydroxide, aspirin, CENTRUM SILVER ULTRA WOMENS, tamoxifen, pravastatin, triamterene-hydrochlorothiazide, amLODipine, losartan, and potassium chloride.  Meds ordered this encounter  Medications  . levothyroxine (SYNTHROID, LEVOTHROID) 50 MCG tablet    Sig: Take 1 tablet (50 mcg total) by mouth daily.    Dispense:  90 tablet    Refill:  1     Follow-up: Return in about 4 months (around 04/14/2016).  Scarlette Calico, MD

## 2015-12-17 ENCOUNTER — Telehealth: Payer: Self-pay | Admitting: Internal Medicine

## 2015-12-17 LAB — PTH, INTACT AND CALCIUM
CALCIUM: 11.3 mg/dL — AB (ref 8.4–10.5)
PTH: 49 pg/mL (ref 14–64)

## 2015-12-17 MED ORDER — LEVOTHYROXINE SODIUM 50 MCG PO TABS: 50.0000 ug | ORAL_TABLET | Freq: Every day | ORAL | Status: DC

## 2015-12-17 NOTE — Telephone Encounter (Signed)
Pt called state that she was here yesterday and Dr. Ronnald Ramp was suppose to send in her thyroid medication to [facility] drug store. Please check, pt stated drug store do not have it.

## 2015-12-17 NOTE — Telephone Encounter (Signed)
done

## 2015-12-18 NOTE — Telephone Encounter (Signed)
Pt aware.

## 2015-12-23 ENCOUNTER — Ambulatory Visit (INDEPENDENT_AMBULATORY_CARE_PROVIDER_SITE_OTHER)
Admission: RE | Admit: 2015-12-23 | Discharge: 2015-12-23 | Disposition: A | Discharge status: Home / Self Care | Payer: Medicare Other | Source: Ambulatory Visit | Attending: Internal Medicine | Admitting: Internal Medicine

## 2015-12-23 DIAGNOSIS — C50819 Malignant neoplasm of overlapping sites of unspecified female breast: Secondary | ICD-10-CM

## 2015-12-23 DIAGNOSIS — J449 Chronic obstructive pulmonary disease, unspecified: Secondary | ICD-10-CM | POA: Diagnosis not present

## 2016-01-06 ENCOUNTER — Other Ambulatory Visit: Payer: Self-pay | Admitting: Internal Medicine

## 2016-02-11 DIAGNOSIS — Z853 Personal history of malignant neoplasm of breast: Secondary | ICD-10-CM | POA: Diagnosis not present

## 2016-02-11 LAB — HM MAMMOGRAPHY

## 2016-02-12 ENCOUNTER — Encounter: Payer: Self-pay | Admitting: Internal Medicine

## 2016-03-02 ENCOUNTER — Ambulatory Visit (INDEPENDENT_AMBULATORY_CARE_PROVIDER_SITE_OTHER): Payer: Medicare Other | Admitting: Ophthalmology

## 2016-03-02 DIAGNOSIS — H43813 Vitreous degeneration, bilateral: Secondary | ICD-10-CM

## 2016-03-02 DIAGNOSIS — H35033 Hypertensive retinopathy, bilateral: Secondary | ICD-10-CM

## 2016-03-02 DIAGNOSIS — I10 Essential (primary) hypertension: Secondary | ICD-10-CM

## 2016-03-02 DIAGNOSIS — H35372 Puckering of macula, left eye: Secondary | ICD-10-CM

## 2016-04-13 ENCOUNTER — Ambulatory Visit: Payer: Medicare Other | Admitting: Internal Medicine

## 2016-04-14 ENCOUNTER — Encounter: Payer: Self-pay | Admitting: Internal Medicine

## 2016-04-14 ENCOUNTER — Ambulatory Visit (INDEPENDENT_AMBULATORY_CARE_PROVIDER_SITE_OTHER): Payer: Medicare Other | Admitting: Internal Medicine

## 2016-04-14 ENCOUNTER — Other Ambulatory Visit (INDEPENDENT_AMBULATORY_CARE_PROVIDER_SITE_OTHER): Payer: Medicare Other

## 2016-04-14 VITALS — BP 130/80 | HR 104 | Temp 98.0°F | Resp 16 | Ht 62.0 in | Wt 129.0 lb

## 2016-04-14 DIAGNOSIS — E118 Type 2 diabetes mellitus with unspecified complications: Secondary | ICD-10-CM

## 2016-04-14 DIAGNOSIS — I10 Essential (primary) hypertension: Secondary | ICD-10-CM | POA: Diagnosis not present

## 2016-04-14 DIAGNOSIS — E038 Other specified hypothyroidism: Secondary | ICD-10-CM | POA: Diagnosis not present

## 2016-04-14 LAB — CBC WITH DIFFERENTIAL/PLATELET
BASOS ABS: 0.1 10*3/uL (ref 0.0–0.1)
Basophils Relative: 0.6 % (ref 0.0–3.0)
EOS ABS: 0.1 10*3/uL (ref 0.0–0.7)
Eosinophils Relative: 0.6 % (ref 0.0–5.0)
HCT: 45 % (ref 36.0–46.0)
Hemoglobin: 15.1 g/dL — ABNORMAL HIGH (ref 12.0–15.0)
LYMPHS ABS: 3.8 10*3/uL (ref 0.7–4.0)
Lymphocytes Relative: 37.9 % (ref 12.0–46.0)
MCHC: 33.5 g/dL (ref 30.0–36.0)
MCV: 90.6 fl (ref 78.0–100.0)
Monocytes Absolute: 0.7 10*3/uL (ref 0.1–1.0)
Monocytes Relative: 7.3 % (ref 3.0–12.0)
NEUTROS ABS: 5.3 10*3/uL (ref 1.4–7.7)
NEUTROS PCT: 53.6 % (ref 43.0–77.0)
PLATELETS: 316 10*3/uL (ref 150.0–400.0)
RBC: 4.97 Mil/uL (ref 3.87–5.11)
RDW: 14.3 % (ref 11.5–15.5)
WBC: 9.9 10*3/uL (ref 4.0–10.5)

## 2016-04-14 LAB — BASIC METABOLIC PANEL
BUN: 15 mg/dL (ref 6–23)
CO2: 27 mEq/L (ref 19–32)
Calcium: 11.4 mg/dL — ABNORMAL HIGH (ref 8.4–10.5)
Chloride: 101 mEq/L (ref 96–112)
Creatinine, Ser: 0.88 mg/dL (ref 0.40–1.20)
GFR: 79.36 mL/min (ref >60.00)
GLUCOSE: 112 mg/dL — AB (ref 70–99)
Potassium: 3.7 mEq/L (ref 3.5–5.1)
Sodium: 137 mEq/L (ref 135–145)

## 2016-04-14 LAB — TSH: TSH: 1.6 u[IU]/mL (ref 0.35–4.50)

## 2016-04-14 LAB — HEMOGLOBIN A1C: Hgb A1c MFr Bld: 6.5 % (ref 4.6–6.5)

## 2016-04-14 MED ORDER — TORSEMIDE 20 MG PO TABS: 20.0000 mg | ORAL_TABLET | Freq: Every day | ORAL | Status: DC

## 2016-04-14 NOTE — Patient Instructions (Signed)
Hypercalcemia Hypercalcemia is having too much calcium in the blood. The body needs calcium to make bones and keep them strong. Calcium also helps the muscles, nerves, brain, and heart work the way they should. Most of the calcium in the body is in the bones. There is also some calcium in the blood. Hypercalcemia can happen when calcium comes out of the bones, or when the kidneys are not able to remove calcium from the blood. Hypercalcemia can be mild or severe. CAUSES There are many possible causes of hypercalcemia. Common causes include:  Hyperparathyroidism. This is a condition in which the body produces too much parathyroid hormone. There are four parathyroid glands in your neck. These glands produce a chemical messenger (hormone) that helps the body absorb calcium from foods and helps your bones release calcium.  Certain kinds of cancer, such as lung cancer, breast cancer, or myeloma. Less common causes of hypercalcemia include:   Getting too much calcium or vitamin D from your diet.  Kidney failure.  Hyperthyroidism.  Being on bed rest for a long time.  Certain medicines.  Infections.  Sarcoidosis. RISK FACTORS This condition is more likely to develop in:  Women.  People who are 60 years or older.  People who have a family history of hypercalcemia. SYMPTOMS  Mild hypercalcemia that starts slowly may not cause symptoms. Severe, sudden hypercalcemia is more likely to cause symptoms, such as:  Loss of appetite.  Increased thirst and frequent urination.  Fatigue.  Nausea and vomiting.  Headache.  Abdominal pain.  Muscle pain, twitching, or weakness.  Constipation.  Blood in the urine.  Pain in the side of the back (flank pain).  Anxiety, confusion, or depression.  Irregular heartbeat (arrhythmia).  Loss of consciousness. DIAGNOSIS  This condition may be diagnosed based on:   Your symptoms.  Blood tests.  Urine  tests.  X-rays.  Ultrasound.  MRI.  CT scan. TREATMENT  Treatment for hypercalcemia depends on the cause. Treatment may include:  Receiving fluids through an IV tube.  Medicines that keep calcium levels steady after receiving fluids (loop diuretics).  Medicines that keep calcium in your bones (bisphosphonates).  Medicines that lower the calcium level in your blood.  Surgery to remove overactive parathyroid glands. HOME CARE INSTRUCTIONS  Take over-the-counter and prescription medicines only as told by your health care provider.  Follow instructions from your health care provider about eating or drinking restrictions.  Drink enough fluid to keep your urine clear or pale yellow.  Stay active. Weight-bearing exercise helps to keep calcium in your bones. Follow instructions from your health care provider about what type and level of exercise is safe for you.  Keep all follow-up visits as told by your health care provider. This is important. SEEK MEDICAL CARE IF:  You have a fever.  You have flank or abdominal pain that is getting worse. SEEK IMMEDIATE MEDICAL CARE IF:   You have severe abdominal or flank pain.  You have chest pain.  You have trouble breathing.  You become very confused and sleepy.  You lose consciousness.   This information is not intended to replace advice given to you by your health care provider. Make sure you discuss any questions you have with your health care provider.   Document Released: 12/26/2004 Document Revised: 07/03/2015 Document Reviewed: 02/27/2015 Elsevier Interactive Patient Education 2016 [employer]  

## 2016-04-14 NOTE — Progress Notes (Signed)
Subjective:  Patient ID: Jackie Horton, female    DOB: 13-Jan-1933  Age: 82 y.o. MRN: [id number]  CC: Fatigue; Hypertension; and Diabetes   HPI KYDEN ELWELL presents for follow-up. She complains of fatigue. She is being monitored for hypercalcemia. She denies cough, chest pain, shortness of breath, DOE, dizziness, lightheadedness, palpitations, or syncope.  Outpatient Prescriptions Prior to Visit  Medication Sig Dispense Refill  . amLODipine (NORVASC) 10 MG tablet TAKE ONE TABLET EACH DAY 90 tablet 3  . aspirin 81 MG tablet Take 81 mg by mouth daily.    Marland Kitchen levothyroxine (SYNTHROID, LEVOTHROID) 50 MCG tablet Take 1 tablet (50 mcg total) by mouth daily. 90 tablet 1  . losartan (COZAAR) 50 MG tablet TAKE ONE TABLET BY MOUTH ONCE DAILY 90 tablet 3  . magnesium hydroxide (MILK OF MAGNESIA) 400 MG/5ML suspension Take 15 mLs by mouth daily as needed for constipation.    . Multiple Vitamins-Minerals (CENTRUM SILVER ULTRA WOMENS) TABS Take 1 tablet by mouth daily.    . potassium chloride (K-DUR) 10 MEQ tablet TAKE ONE TABLET EACH DAY 90 tablet 3  . pravastatin (PRAVACHOL) 20 MG tablet TAKE ONE TABLET BY MOUTH ONCE DAILY 90 tablet 3  . tamoxifen (NOLVADEX) 20 MG tablet TAKE ONE TABLET EACH DAY 90 tablet 4  . triamterene-hydrochlorothiazide (MAXZIDE-25) 37.5-25 MG per tablet TAKE ONE-HALF TABLET DAILY 45 tablet 11   No facility-administered medications prior to visit.    ROS Review of Systems  Constitutional: Positive for fatigue. Negative for fever, chills, diaphoresis, appetite change and unexpected weight change.  HENT: Negative.   Eyes: Negative.   Respiratory: Negative.  Negative for cough, choking, chest tightness, shortness of breath and stridor.   Cardiovascular: Negative.  Negative for chest pain, palpitations and leg swelling.  Gastrointestinal: Negative.  Negative for nausea, vomiting, abdominal pain and constipation.  Endocrine: Negative.   Genitourinary: Negative.     Musculoskeletal: Negative.  Negative for myalgias, back pain and neck pain.  Skin: Negative.  Negative for color change and rash.  Allergic/Immunologic: Negative.   Neurological: Negative.  Negative for dizziness, tremors, weakness, light-headedness, numbness and headaches.  Hematological: Negative.  Negative for adenopathy. Does not bruise/bleed easily.  Psychiatric/Behavioral: Negative.     Objective:  BP 130/80 mmHg  Pulse 104  Temp(Src) 98 F (36.7 C) (Oral)  Resp 16  Ht 5\' 2"  (1.575 m)  Wt 129 lb (58.514 kg)  BMI 23.59 kg/m2  SpO2 96%  BP Readings from Last 3 Encounters:  04/14/16 130/80  12/16/15 118/80  10/14/15 130/82    Wt Readings from Last 3 Encounters:  04/14/16 129 lb (58.514 kg)  12/16/15 129 lb (58.514 kg)  10/14/15 129 lb (58.514 kg)    Physical Exam  Constitutional: She is oriented to person, place, and time. No distress.  HENT:  Mouth/Throat: Oropharynx is clear and moist. No oropharyngeal exudate.  Eyes: Conjunctivae are normal. Right eye exhibits no discharge. Left eye exhibits no discharge. No scleral icterus.  Neck: Normal range of motion. Neck supple. No JVD present. No tracheal deviation present. No thyromegaly present.  Cardiovascular: Normal rate, regular rhythm, normal heart sounds and intact distal pulses.  Exam reveals no gallop and no friction rub.   No murmur heard. Pulmonary/Chest: Effort normal and breath sounds normal. No stridor. No respiratory distress. She has no wheezes. She has no rales. She exhibits no tenderness.  Abdominal: Soft. Bowel sounds are normal. She exhibits no distension and no mass. There is no tenderness. There is  no rebound and no guarding.  Musculoskeletal: Normal range of motion. She exhibits no edema or tenderness.  Lymphadenopathy:    She has no cervical adenopathy.  Neurological: She is oriented to person, place, and time.  Skin: Skin is warm and dry. No rash noted. She is not diaphoretic. No erythema. No  pallor.  Vitals reviewed.   Lab Results  Component Value Date   WBC 8.4 06/10/2015   HGB 15.1* 06/10/2015   HCT 45.1 06/10/2015   PLT 274.0 06/10/2015   GLUCOSE 105* 12/16/2015   CHOL 159 06/10/2015   TRIG 102.0 06/10/2015   HDL 36.40* 06/10/2015   LDLCALC 102* 06/10/2015   ALT 27 06/13/2014   AST 21 06/13/2014   NA 137 12/16/2015   K 3.8 12/16/2015   CL 102 12/16/2015   CREATININE 0.78 12/16/2015   BUN 18 12/16/2015   CO2 25 12/16/2015   TSH 2.54 12/16/2015   INR 1.0 05/16/2009   HGBA1C 6.5 12/16/2015   MICROALBUR <0.7 12/16/2015    Dg Chest 2 View  12/23/2015  CLINICAL DATA:  Routine physical examination common no complaints, history of hypertension, hyperlipidemia, COPD, and diabetes. Recently discontinued smoking. EXAM: CHEST  2 VIEW COMPARISON:  PA and lateral chest x-ray of June 18, 2014 FINDINGS: The lungs are adequately inflated. The interstitial markings are coarse likely reflecting the patient's smoking history. There is no alveolar infiltrate. There is no pulmonary parenchymal mass or nodule. There is no pleural effusion. The heart and pulmonary vascularity are normal. The mediastinum is normal in width. There is gentle stable S shaped thoracolumbar scoliosis. IMPRESSION: COPD.  No acute cardiopulmonary abnormality. Electronically Signed   By: David  Martinique M.D.   On: 12/23/2015 10:31    Assessment & Plan:   Baylen was seen today for fatigue, hypertension and diabetes.  Diagnoses and all orders for this visit:  Essential hypertension- due to the hypercalcemia will change her blood pressure management from a thiazide diuretic to a loop diuretic. -     CBC with Differential/Platelet; Future -     torsemide (DEMADEX) 20 MG tablet; Take 1 tablet (20 mg total) by mouth daily.  Other specified hypothyroidism- her TSH is in the normal range, she will remain on the current dose of levothyroxine. -     TSH; Future  Hypercalcemia- her calcium level continues to  climb but her parathyroid hormone level is normal, I think this is related to the thiazide diuretic so I've asked her to stop taking the thiazide diuretic and will switch to a loop diuretic. -     PTH, intact and calcium; Future -     Ambulatory referral to Endocrinology  Type 2 diabetes mellitus with complication, without long-term current use of insulin ([place])- her blood sugar is adequately well controlled. -     Basic metabolic panel; Future -     Hemoglobin A1c; Future   I have discontinued Ms. Fenster's triamterene-hydrochlorothiazide. I am also having her start on torsemide. Additionally, I am having her maintain her magnesium hydroxide, aspirin, CENTRUM SILVER ULTRA WOMENS, tamoxifen, amLODipine, losartan, potassium chloride, levothyroxine, and pravastatin.  Meds ordered this encounter  Medications  . torsemide (DEMADEX) 20 MG tablet    Sig: Take 1 tablet (20 mg total) by mouth daily.    Dispense:  90 tablet    Refill:  1     Follow-up: Return in about 3 months (around 07/15/2016).  Scarlette Calico, MD

## 2016-04-14 NOTE — Progress Notes (Signed)
Pre visit review using our clinic review tool, if applicable. No additional management support is needed unless otherwise documented below in the visit note. 

## 2016-04-15 ENCOUNTER — Ambulatory Visit: Payer: Medicare Other | Admitting: Internal Medicine

## 2016-04-15 LAB — PTH, INTACT AND CALCIUM
CALCIUM: 11.2 mg/dL — AB (ref 8.4–10.5)
PTH: 38 pg/mL (ref 14–64)

## 2016-05-14 ENCOUNTER — Encounter: Payer: Self-pay | Admitting: Internal Medicine

## 2016-05-19 ENCOUNTER — Encounter: Payer: Self-pay | Admitting: Internal Medicine

## 2016-07-06 ENCOUNTER — Ambulatory Visit (INDEPENDENT_AMBULATORY_CARE_PROVIDER_SITE_OTHER): Payer: Medicare Other | Admitting: Internal Medicine

## 2016-07-06 ENCOUNTER — Encounter: Payer: Self-pay | Admitting: Internal Medicine

## 2016-07-06 VITALS — BP 128/74 | HR 96 | Temp 97.5°F | Ht 62.0 in | Wt 123.8 lb

## 2016-07-06 DIAGNOSIS — I10 Essential (primary) hypertension: Secondary | ICD-10-CM | POA: Diagnosis not present

## 2016-07-06 DIAGNOSIS — Z0000 Encounter for general adult medical examination without abnormal findings: Secondary | ICD-10-CM

## 2016-07-06 DIAGNOSIS — E039 Hypothyroidism, unspecified: Secondary | ICD-10-CM | POA: Diagnosis not present

## 2016-07-06 DIAGNOSIS — E118 Type 2 diabetes mellitus with unspecified complications: Secondary | ICD-10-CM | POA: Diagnosis not present

## 2016-07-06 DIAGNOSIS — E7800 Pure hypercholesterolemia, unspecified: Secondary | ICD-10-CM

## 2016-07-06 DIAGNOSIS — Z23 Encounter for immunization: Secondary | ICD-10-CM

## 2016-07-06 NOTE — Patient Instructions (Signed)
Preventive Care for Adults, Female A healthy lifestyle and preventive care can promote health and wellness. Preventive health guidelines for women include the following key practices.  A routine yearly physical is a good way to check with your health care provider about your health and preventive screening. It is a chance to share any concerns and updates on your health and to receive a thorough exam.  Visit your dentist for a routine exam and preventive care every 6 months. Brush your teeth twice a day and floss once a day. Good oral hygiene prevents tooth decay and gum disease.  The frequency of eye exams is based on your age, health, family medical history, use of contact lenses, and other factors. Follow your health care provider's recommendations for frequency of eye exams.  Eat a healthy diet. Foods like vegetables, fruits, whole grains, low-fat dairy products, and lean protein foods contain the nutrients you need without too many calories. Decrease your intake of foods high in solid fats, added sugars, and salt. Eat the right amount of calories for you.Get information about a proper diet from your health care provider, if necessary.  Regular physical exercise is one of the most important things you can do for your health. Most adults should get at least 150 minutes of moderate-intensity exercise (any activity that increases your heart rate and causes you to sweat) each week. In addition, most adults need muscle-strengthening exercises on 2 or more days a week.  Maintain a healthy weight. The body mass index (BMI) is a screening tool to identify possible weight problems. It provides an estimate of body fat based on height and weight. Your health care provider can find your BMI and can help you achieve or maintain a healthy weight.For adults 20 years and older:  A BMI below 18.5 is considered underweight.  A BMI of 18.5 to 24.9 is normal.  A BMI of 25 to 29.9 is considered overweight.  A  BMI of 30 and above is considered obese.  Maintain normal blood lipids and cholesterol levels by exercising and minimizing your intake of saturated fat. Eat a balanced diet with plenty of fruit and vegetables. Blood tests for lipids and cholesterol should begin at age 45 and be repeated every 5 years. If your lipid or cholesterol levels are high, you are over 50, or you are at high risk for heart disease, you may need your cholesterol levels checked more frequently.Ongoing high lipid and cholesterol levels should be treated with medicines if diet and exercise are not working.  If you smoke, find out from your health care provider how to quit. If you do not use tobacco, do not start.  Lung cancer screening is recommended for adults aged 45-80 years who are at high risk for developing lung cancer because of a history of smoking. A yearly low-dose CT scan of the lungs is recommended for people who have at least a 30-pack-year history of smoking and are a current smoker or have quit within the past 15 years. A pack year of smoking is smoking an average of 1 pack of cigarettes a day for 1 year (for example: 1 pack a day for 30 years or 2 packs a day for 15 years). Yearly screening should continue until the smoker has stopped smoking for at least 15 years. Yearly screening should be stopped for people who develop a health problem that would prevent them from having lung cancer treatment.  If you are pregnant, do not drink alcohol. If you are  breastfeeding, be very cautious about drinking alcohol. If you are not pregnant and choose to drink alcohol, do not have more than 1 drink per day. One drink is considered to be 12 ounces (355 mL) of beer, 5 ounces (148 mL) of wine, or 1.5 ounces (44 mL) of liquor.  Avoid use of street drugs. Do not share needles with anyone. Ask for help if you need support or instructions about stopping the use of drugs.  High blood pressure causes heart disease and increases the risk  of stroke. Your blood pressure should be checked at least every 1 to 2 years. Ongoing high blood pressure should be treated with medicines if weight loss and exercise do not work.  If you are 55-79 years old, ask your health care provider if you should take aspirin to prevent strokes.  Diabetes screening is done by taking a blood sample to check your blood glucose level after you have not eaten for a certain period of time (fasting). If you are not overweight and you do not have risk factors for diabetes, you should be screened once every 3 years starting at age 45. If you are overweight or obese and you are 40-70 years of age, you should be screened for diabetes every year as part of your cardiovascular risk assessment.  Breast cancer screening is essential preventive care for women. You should practice "breast self-awareness." This means understanding the normal appearance and feel of your breasts and may include breast self-examination. Any changes detected, no matter how small, should be reported to a health care provider. Women in their 20s and 30s should have a clinical breast exam (CBE) by a health care provider as part of a regular health exam every 1 to 3 years. After age 40, women should have a CBE every year. Starting at age 40, women should consider having a mammogram (breast X-ray test) every year. Women who have a family history of breast cancer should talk to their health care provider about genetic screening. Women at a high risk of breast cancer should talk to their health care providers about having an MRI and a mammogram every year.  Breast cancer gene (BRCA)-related cancer risk assessment is recommended for women who have family members with BRCA-related cancers. BRCA-related cancers include breast, ovarian, tubal, and peritoneal cancers. Having family members with these cancers may be associated with an increased risk for harmful changes (mutations) in the breast cancer genes BRCA1 and  BRCA2. Results of the assessment will determine the need for genetic counseling and BRCA1 and BRCA2 testing.  Your health care provider may recommend that you be screened regularly for cancer of the pelvic organs (ovaries, uterus, and vagina). This screening involves a pelvic examination, including checking for microscopic changes to the surface of your cervix (Pap test). You may be encouraged to have this screening done every 3 years, beginning at age 21.  For women ages 30-65, health care providers may recommend pelvic exams and Pap testing every 3 years, or they may recommend the Pap and pelvic exam, combined with testing for human papilloma virus (HPV), every 5 years. Some types of HPV increase your risk of cervical cancer. Testing for HPV may also be done on women of any age with unclear Pap test results.  Other health care providers may not recommend any screening for nonpregnant women who are considered low risk for pelvic cancer and who do not have symptoms. Ask your health care provider if a screening pelvic exam is right for   you.  If you have had past treatment for cervical cancer or a condition that could lead to cancer, you need Pap tests and screening for cancer for at least 20 years after your treatment. If Pap tests have been discontinued, your risk factors (such as having a new sexual partner) need to be reassessed to determine if screening should resume. Some women have medical problems that increase the chance of getting cervical cancer. In these cases, your health care provider may recommend more frequent screening and Pap tests.  Colorectal cancer can be detected and often prevented. Most routine colorectal cancer screening begins at the age of 46 years and continues through age 75 years. However, your health care provider may recommend screening at an earlier age if you have risk factors for colon cancer. On a yearly basis, your health care provider may provide home test kits to check  for hidden blood in the stool. Use of a small camera at the end of a tube, to directly examine the colon (sigmoidoscopy or colonoscopy), can detect the earliest forms of colorectal cancer. Talk to your health care provider about this at age 47, when routine screening begins. Direct exam of the colon should be repeated every 5-10 years through age 60 years, unless early forms of precancerous polyps or small growths are found.  People who are at an increased risk for hepatitis B should be screened for this virus. You are considered at high risk for hepatitis B if:  You were born in a country where hepatitis B occurs often. Talk with your health care provider about which countries are considered high risk.  Your parents were born in a high-risk country and you have not received a shot to protect against hepatitis B (hepatitis B vaccine).  You have HIV or AIDS.  You use needles to inject street drugs.  You live with, or have sex with, someone who has hepatitis B.  You get hemodialysis treatment.  You take certain medicines for conditions like cancer, organ transplantation, and autoimmune conditions.  Hepatitis C blood testing is recommended for all people born from [birth year] through 1965 and any individual with known risks for hepatitis C.  Practice safe sex. Use condoms and avoid high-risk sexual practices to reduce the spread of sexually transmitted infections (STIs). STIs include gonorrhea, chlamydia, syphilis, trichomonas, herpes, HPV, and human immunodeficiency virus (HIV). Herpes, HIV, and HPV are viral illnesses that have no cure. They can result in disability, cancer, and death.  You should be screened for sexually transmitted illnesses (STIs) including gonorrhea and chlamydia if:  You are sexually active and are younger than 24 years.  You are older than 24 years and your health care provider tells you that you are at risk for this type of infection.  Your sexual activity has changed  since you were last screened and you are at an increased risk for chlamydia or gonorrhea. Ask your health care provider if you are at risk.  If you are at risk of being infected with HIV, it is recommended that you take a prescription medicine daily to prevent HIV infection. This is called preexposure prophylaxis (PrEP). You are considered at risk if:  You are sexually active and do not regularly use condoms or know the HIV status of your partner(s).  You take drugs by injection.  You are sexually active with a partner who has HIV.  Talk with your health care provider about whether you are at high risk of being infected with HIV. If  you choose to begin PrEP, you should first be tested for HIV. You should then be tested every 3 months for as long as you are taking PrEP.  Osteoporosis is a disease in which the bones lose minerals and strength with aging. This can result in serious bone fractures or breaks. The risk of osteoporosis can be identified using a bone density scan. Women ages 67 years and over and women at risk for fractures or osteoporosis should discuss screening with their health care providers. Ask your health care provider whether you should take a calcium supplement or vitamin D to reduce the rate of osteoporosis.  Menopause can be associated with physical symptoms and risks. Hormone replacement therapy is available to decrease symptoms and risks. You should talk to your health care provider about whether hormone replacement therapy is right for you.  Use sunscreen. Apply sunscreen liberally and repeatedly throughout the day. You should seek shade when your shadow is shorter than you. Protect yourself by wearing long sleeves, pants, a wide-brimmed hat, and sunglasses year round, whenever you are outdoors.  Once a month, do a whole body skin exam, using a mirror to look at the skin on your back. Tell your health care provider of new moles, moles that have irregular borders, moles that  are larger than a pencil eraser, or moles that have changed in shape or color.  Stay current with required vaccines (immunizations).  Influenza vaccine. All adults should be immunized every year.  Tetanus, diphtheria, and acellular pertussis (Td, Tdap) vaccine. Pregnant women should receive 1 dose of Tdap vaccine during each pregnancy. The dose should be obtained regardless of the length of time since the last dose. Immunization is preferred during the 27th-36th week of gestation. An adult who has not previously received Tdap or who does not know her vaccine status should receive 1 dose of Tdap. This initial dose should be followed by tetanus and diphtheria toxoids (Td) booster doses every 10 years. Adults with an unknown or incomplete history of completing a 3-dose immunization series with Td-containing vaccines should begin or complete a primary immunization series including a Tdap dose. Adults should receive a Td booster every 10 years.  Varicella vaccine. An adult without evidence of immunity to varicella should receive 2 doses or a second dose if she has previously received 1 dose. Pregnant females who do not have evidence of immunity should receive the first dose after pregnancy. This first dose should be obtained before leaving the health care facility. The second dose should be obtained 4-8 weeks after the first dose.  Human papillomavirus (HPV) vaccine. Females aged 13-26 years who have not received the vaccine previously should obtain the 3-dose series. The vaccine is not recommended for use in pregnant females. However, pregnancy testing is not needed before receiving a dose. If a female is found to be pregnant after receiving a dose, no treatment is needed. In that case, the remaining doses should be delayed until after the pregnancy. Immunization is recommended for any person with an immunocompromised condition through the age of 61 years if she did not get any or all doses earlier. During the  3-dose series, the second dose should be obtained 4-8 weeks after the first dose. The third dose should be obtained 24 weeks after the first dose and 16 weeks after the second dose.  Zoster vaccine. One dose is recommended for adults aged 30 years or older unless certain conditions are present.  Measles, mumps, and rubella (MMR) vaccine. Adults born  before 1957 generally are considered immune to measles and mumps. Adults born in 1957 or later should have 1 or more doses of MMR vaccine unless there is a contraindication to the vaccine or there is laboratory evidence of immunity to each of the three diseases. A routine second dose of MMR vaccine should be obtained at least 28 days after the first dose for students attending postsecondary schools, health care workers, or international travelers. People who received inactivated measles vaccine or an unknown type of measles vaccine during 1963-1967 should receive 2 doses of MMR vaccine. People who received inactivated mumps vaccine or an unknown type of mumps vaccine before 1979 and are at high risk for mumps infection should consider immunization with 2 doses of MMR vaccine. For females of childbearing age, rubella immunity should be determined. If there is no evidence of immunity, females who are not pregnant should be vaccinated. If there is no evidence of immunity, females who are pregnant should delay immunization until after pregnancy. Unvaccinated health care workers born before 1957 who lack laboratory evidence of measles, mumps, or rubella immunity or laboratory confirmation of disease should consider measles and mumps immunization with 2 doses of MMR vaccine or rubella immunization with 1 dose of MMR vaccine.  Pneumococcal 13-valent conjugate (PCV13) vaccine. When indicated, a person who is uncertain of his immunization history and has no record of immunization should receive the PCV13 vaccine. All adults 65 years of age and older should receive this  vaccine. An adult aged 19 years or older who has certain medical conditions and has not been previously immunized should receive 1 dose of PCV13 vaccine. This PCV13 should be followed with a dose of pneumococcal polysaccharide (PPSV23) vaccine. Adults who are at high risk for pneumococcal disease should obtain the PPSV23 vaccine at least 8 weeks after the dose of PCV13 vaccine. Adults older than 80 years of age who have normal immune system function should obtain the PPSV23 vaccine dose at least 1 year after the dose of PCV13 vaccine.  Pneumococcal polysaccharide (PPSV23) vaccine. When PCV13 is also indicated, PCV13 should be obtained first. All adults aged 65 years and older should be immunized. An adult younger than age 65 years who has certain medical conditions should be immunized. Any person who resides in a nursing home or long-term care facility should be immunized. An adult smoker should be immunized. People with an immunocompromised condition and certain other conditions should receive both PCV13 and PPSV23 vaccines. People with human immunodeficiency virus (HIV) infection should be immunized as soon as possible after diagnosis. Immunization during chemotherapy or radiation therapy should be avoided. Routine use of PPSV23 vaccine is not recommended for American Indians, [place] Natives, or people younger than 65 years unless there are medical conditions that require PPSV23 vaccine. When indicated, people who have unknown immunization and have no record of immunization should receive PPSV23 vaccine. One-time revaccination 5 years after the first dose of PPSV23 is recommended for people aged 19-64 years who have chronic kidney failure, nephrotic syndrome, asplenia, or immunocompromised conditions. People who received 1-2 doses of PPSV23 before age 65 years should receive another dose of PPSV23 vaccine at age 65 years or later if at least 5 years have passed since the previous dose. Doses of PPSV23 are not  needed for people immunized with PPSV23 at or after age 65 years.  Meningococcal vaccine. Adults with asplenia or persistent complement component deficiencies should receive 2 doses of quadrivalent meningococcal conjugate (MenACWY-D) vaccine. The doses should be obtained   at least 2 months apart. Microbiologists working with certain meningococcal bacteria, [place] recruits, people at risk during an outbreak, and people who travel to or live in countries with a high rate of meningitis should be immunized. A first-year college student up through age 34 years who is living in a residence hall should receive a dose if she did not receive a dose on or after her 16th birthday. Adults who have certain high-risk conditions should receive one or more doses of vaccine.  Hepatitis A vaccine. Adults who wish to be protected from this disease, have certain high-risk conditions, work with hepatitis A-infected animals, work in hepatitis A research labs, or travel to or work in countries with a high rate of hepatitis A should be immunized. Adults who were previously unvaccinated and who anticipate close contact with an international adoptee during the first 60 days after arrival in the [place] from a country with a high rate of hepatitis A should be immunized.  Hepatitis B vaccine. Adults who wish to be protected from this disease, have certain high-risk conditions, may be exposed to blood or other infectious body fluids, are household contacts or sex partners of hepatitis B positive people, are clients or workers in certain care facilities, or travel to or work in countries with a high rate of hepatitis B should be immunized.  Haemophilus influenzae type b (Hib) vaccine. A previously unvaccinated person with asplenia or sickle cell disease or having a scheduled splenectomy should receive 1 dose of Hib vaccine. Regardless of previous immunization, a recipient of a hematopoietic stem cell transplant should receive a  3-dose series 6-12 months after her successful transplant. Hib vaccine is not recommended for adults with HIV infection. Preventive Services / Frequency Ages 35 to 4 years  Blood pressure check.** / Every 3-5 years.  Lipid and cholesterol check.** / Every 5 years beginning at age 60.  Clinical breast exam.** / Every 3 years for women in their 71s and 10s.  BRCA-related cancer risk assessment.** / For women who have family members with a BRCA-related cancer (breast, ovarian, tubal, or peritoneal cancers).  Pap test.** / Every 2 years from ages 76 through 26. Every 3 years starting at age 61 through age 76 or 93 with a history of 3 consecutive normal Pap tests.  HPV screening.** / Every 3 years from ages 37 through ages 60 to 51 with a history of 3 consecutive normal Pap tests.  Hepatitis C blood test.** / For any individual with known risks for hepatitis C.  Skin self-exam. / Monthly.  Influenza vaccine. / Every year.  Tetanus, diphtheria, and acellular pertussis (Tdap, Td) vaccine.** / Consult your health care provider. Pregnant women should receive 1 dose of Tdap vaccine during each pregnancy. 1 dose of Td every 10 years.  Varicella vaccine.** / Consult your health care provider. Pregnant females who do not have evidence of immunity should receive the first dose after pregnancy.  HPV vaccine. / 3 doses over 6 months, if 93 and younger. The vaccine is not recommended for use in pregnant females. However, pregnancy testing is not needed before receiving a dose.  Measles, mumps, rubella (MMR) vaccine.** / You need at least 1 dose of MMR if you were born in 1957 or later. You may also need a 2nd dose. For females of childbearing age, rubella immunity should be determined. If there is no evidence of immunity, females who are not pregnant should be vaccinated. If there is no evidence of immunity, females who are  pregnant should delay immunization until after pregnancy.  Pneumococcal  13-valent conjugate (PCV13) vaccine.** / Consult your health care provider.  Pneumococcal polysaccharide (PPSV23) vaccine.** / 1 to 2 doses if you smoke cigarettes or if you have certain conditions.  Meningococcal vaccine.** / 1 dose if you are age 59 to 5 years and a Market researcher living in a residence hall, or have one of several medical conditions, you need to get vaccinated against meningococcal disease. You may also need additional booster doses.  Hepatitis A vaccine.** / Consult your health care provider.  Hepatitis B vaccine.** / Consult your health care provider.  Haemophilus influenzae type b (Hib) vaccine.** / Consult your health care provider. Ages 70 to 72 years  Blood pressure check.** / Every year.  Lipid and cholesterol check.** / Every 5 years beginning at age 29 years.  Lung cancer screening. / Every year if you are aged 84-80 years and have a 30-pack-year history of smoking and currently smoke or have quit within the past 15 years. Yearly screening is stopped once you have quit smoking for at least 15 years or develop a health problem that would prevent you from having lung cancer treatment.  Clinical breast exam.** / Every year after age 29 years.  BRCA-related cancer risk assessment.** / For women who have family members with a BRCA-related cancer (breast, ovarian, tubal, or peritoneal cancers).  Mammogram.** / Every year beginning at age 5 years and continuing for as long as you are in good health. Consult with your health care provider.  Pap test.** / Every 3 years starting at age 65 years through age 44 or 91 years with a history of 3 consecutive normal Pap tests.  HPV screening.** / Every 3 years from ages 83 years through ages 70 to 54 years with a history of 3 consecutive normal Pap tests.  Fecal occult blood test (FOBT) of stool. / Every year beginning at age 18 years and continuing until age 17 years. You may not need to do this test if you get  a colonoscopy every 10 years.  Flexible sigmoidoscopy or colonoscopy.** / Every 5 years for a flexible sigmoidoscopy or every 10 years for a colonoscopy beginning at age 71 years and continuing until age 95 years.  Hepatitis C blood test.** / For all people born from [birth year] through 1963 and any individual with known risks for hepatitis C.  Skin self-exam. / Monthly.  Influenza vaccine. / Every year.  Tetanus, diphtheria, and acellular pertussis (Tdap/Td) vaccine.** / Consult your health care provider. Pregnant women should receive 1 dose of Tdap vaccine during each pregnancy. 1 dose of Td every 10 years.  Varicella vaccine.** / Consult your health care provider. Pregnant females who do not have evidence of immunity should receive the first dose after pregnancy.  Zoster vaccine.** / 1 dose for adults aged 79 years or older.  Measles, mumps, rubella (MMR) vaccine.** / You need at least 1 dose of MMR if you were born in 1957 or later. You may also need a second dose. For females of childbearing age, rubella immunity should be determined. If there is no evidence of immunity, females who are not pregnant should be vaccinated. If there is no evidence of immunity, females who are pregnant should delay immunization until after pregnancy.  Pneumococcal 13-valent conjugate (PCV13) vaccine.** / Consult your health care provider.  Pneumococcal polysaccharide (PPSV23) vaccine.** / 1 to 2 doses if you smoke cigarettes or if you have certain conditions.  Meningococcal vaccine.** /  Consult your health care provider.  Hepatitis A vaccine.** / Consult your health care provider.  Hepatitis B vaccine.** / Consult your health care provider.  Haemophilus influenzae type b (Hib) vaccine.** / Consult your health care provider. Ages 31 years and over  Blood pressure check.** / Every year.  Lipid and cholesterol check.** / Every 5 years beginning at age 87 years.  Lung cancer screening. / Every year if you  are aged 64-80 years and have a 30-pack-year history of smoking and currently smoke or have quit within the past 15 years. Yearly screening is stopped once you have quit smoking for at least 15 years or develop a health problem that would prevent you from having lung cancer treatment.  Clinical breast exam.** / Every year after age 48 years.  BRCA-related cancer risk assessment.** / For women who have family members with a BRCA-related cancer (breast, ovarian, tubal, or peritoneal cancers).  Mammogram.** / Every year beginning at age 31 years and continuing for as long as you are in good health. Consult with your health care provider.  Pap test.** / Every 3 years starting at age 41 years through age 43 or 31 years with 3 consecutive normal Pap tests. Testing can be stopped between 65 and 70 years with 3 consecutive normal Pap tests and no abnormal Pap or HPV tests in the past 10 years.  HPV screening.** / Every 3 years from ages 75 years through ages 28 or 79 years with a history of 3 consecutive normal Pap tests. Testing can be stopped between 65 and 70 years with 3 consecutive normal Pap tests and no abnormal Pap or HPV tests in the past 10 years.  Fecal occult blood test (FOBT) of stool. / Every year beginning at age 19 years and continuing until age 51 years. You may not need to do this test if you get a colonoscopy every 10 years.  Flexible sigmoidoscopy or colonoscopy.** / Every 5 years for a flexible sigmoidoscopy or every 10 years for a colonoscopy beginning at age 32 years and continuing until age 81 years.  Hepatitis C blood test.** / For all people born from [birth year] through 1963 and any individual with known risks for hepatitis C.  Osteoporosis screening.** / A one-time screening for women ages 43 years and over and women at risk for fractures or osteoporosis.  Skin self-exam. / Monthly.  Influenza vaccine. / Every year.  Tetanus, diphtheria, and acellular pertussis (Tdap/Td)  vaccine.** / 1 dose of Td every 10 years.  Varicella vaccine.** / Consult your health care provider.  Zoster vaccine.** / 1 dose for adults aged 53 years or older.  Pneumococcal 13-valent conjugate (PCV13) vaccine.** / Consult your health care provider.  Pneumococcal polysaccharide (PPSV23) vaccine.** / 1 dose for all adults aged 75 years and older.  Meningococcal vaccine.** / Consult your health care provider.  Hepatitis A vaccine.** / Consult your health care provider.  Hepatitis B vaccine.** / Consult your health care provider.  Haemophilus influenzae type b (Hib) vaccine.** / Consult your health care provider. ** Family history and personal history of risk and conditions may change your health care provider's recommendations.   This information is not intended to replace advice given to you by your health care provider. Make sure you discuss any questions you have with your health care provider.   Document Released: 12/08/2001 Document Revised: 11/02/2014 Document Reviewed: 03/09/2011 Elsevier Interactive Patient Education [employer].

## 2016-07-06 NOTE — Progress Notes (Signed)
Pre visit review using our clinic review tool, if applicable. No additional management support is needed unless otherwise documented below in the visit note. 

## 2016-07-06 NOTE — Progress Notes (Signed)
Subjective:  Patient ID: Jackie Horton, female    DOB: 05/24/1933  Age: 82 y.o. MRN: [id number]  CC: Annual Exam; Hypertension; Diabetes; and Hyperlipidemia   HPI Jackie Horton presents for a CPX.  She tells me her blood pressures been well controlled on the combination of losartan and amlodipine. She's had no recent episodes of chest pain/shortness of breath/headache/blurred vision/palpitations/edema. She complains of fatigue and is concerned that her thyroid dose might not be high enough. She denies changes in her weight, constipation, diarrhea, or edema.  She is tolerating the cholesterol medicine well with no muscle or joint aches.     Outpatient Medications Prior to Visit  Medication Sig Dispense Refill  . amLODipine (NORVASC) 10 MG tablet TAKE ONE TABLET EACH DAY 90 tablet 3  . aspirin 81 MG tablet Take 81 mg by mouth daily.    Marland Kitchen levothyroxine (SYNTHROID, LEVOTHROID) 50 MCG tablet Take 1 tablet (50 mcg total) by mouth daily. 90 tablet 1  . losartan (COZAAR) 50 MG tablet TAKE ONE TABLET BY MOUTH ONCE DAILY 90 tablet 3  . magnesium hydroxide (MILK OF MAGNESIA) 400 MG/5ML suspension Take 15 mLs by mouth daily as needed for constipation.    . Multiple Vitamins-Minerals (CENTRUM SILVER ULTRA WOMENS) TABS Take 1 tablet by mouth daily.    . potassium chloride (K-DUR) 10 MEQ tablet TAKE ONE TABLET EACH DAY 90 tablet 3  . pravastatin (PRAVACHOL) 20 MG tablet TAKE ONE TABLET BY MOUTH ONCE DAILY 90 tablet 3  . tamoxifen (NOLVADEX) 20 MG tablet TAKE ONE TABLET EACH DAY 90 tablet 4  . torsemide (DEMADEX) 20 MG tablet Take 1 tablet (20 mg total) by mouth daily. 90 tablet 1   No facility-administered medications prior to visit.     ROS Review of Systems  Constitutional: Positive for fatigue. Negative for activity change, appetite change, chills, diaphoresis, fever and unexpected weight change.  HENT: Negative.  Negative for congestion, sinus pressure, sore throat, trouble  swallowing and voice change.   Eyes: Negative.  Negative for visual disturbance.  Respiratory: Negative.  Negative for cough, choking, chest tightness, shortness of breath and stridor.   Cardiovascular: Negative.  Negative for chest pain and leg swelling.  Gastrointestinal: Negative.  Negative for abdominal pain, blood in stool, constipation, diarrhea, nausea and vomiting.  Endocrine: Negative.   Genitourinary: Negative.  Negative for difficulty urinating and dysuria.  Musculoskeletal: Negative.  Negative for arthralgias, back pain, joint swelling, myalgias and neck pain.  Skin: Negative.   Allergic/Immunologic: Negative.   Neurological: Negative.  Negative for dizziness, syncope, speech difficulty, weakness, light-headedness, numbness and headaches.  Hematological: Negative.  Negative for adenopathy. Does not bruise/bleed easily.  Psychiatric/Behavioral: Negative.  Negative for sleep disturbance. The patient is not nervous/anxious.     Objective:  BP 128/74 (BP Location: Left Arm, Patient Position: Sitting, Cuff Size: Normal)   Pulse 96   Temp 97.5 F (36.4 C) (Oral)   Ht 5\' 2"  (1.575 m)   Wt 123 lb 12 oz (56.1 kg)   SpO2 96%   BMI 22.63 kg/m   BP Readings from Last 3 Encounters:  07/06/16 128/74  04/14/16 130/80  12/16/15 118/80    Wt Readings from Last 3 Encounters:  07/06/16 123 lb 12 oz (56.1 kg)  04/14/16 129 lb (58.5 kg)  12/16/15 129 lb (58.5 kg)    Physical Exam  Constitutional: She is oriented to person, place, and time. No distress.  HENT:  Mouth/Throat: Oropharynx is clear and moist. No oropharyngeal  exudate.  Eyes: Conjunctivae are normal. Right eye exhibits no discharge. Left eye exhibits no discharge. No scleral icterus.  Neck: Normal range of motion. Neck supple. No JVD present. No tracheal deviation present. No thyromegaly present.  Cardiovascular: Normal rate, regular rhythm, normal heart sounds and intact distal pulses.  Exam reveals no gallop and no  friction rub.   No murmur heard. Pulmonary/Chest: Effort normal and breath sounds normal. No stridor. No respiratory distress. She has no wheezes. She has no rales. She exhibits no tenderness.  Abdominal: Soft. Bowel sounds are normal. She exhibits no distension and no mass. There is no tenderness. There is no rebound and no guarding.  Genitourinary:  Genitourinary Comments: Breast, genitourinary, and rectal exams were deferred at her request.  Musculoskeletal: Normal range of motion. She exhibits no edema, tenderness or deformity.  Lymphadenopathy:    She has no cervical adenopathy.  Neurological: She is oriented to person, place, and time.  Skin: Skin is warm and dry. No rash noted. She is not diaphoretic. No erythema. No pallor.  Psychiatric: She has a normal mood and affect. Her behavior is normal. Judgment and thought content normal.  Vitals reviewed.   Lab Results  Component Value Date   WBC 9.9 04/14/2016   HGB 15.1 (H) 04/14/2016   HCT 45.0 04/14/2016   PLT 316.0 04/14/2016   GLUCOSE 110 (H) 07/08/2016   CHOL 202 (H) 07/08/2016   TRIG 132.0 07/08/2016   HDL 40.40 07/08/2016   LDLCALC 135 (H) 07/08/2016   ALT 30 07/08/2016   AST 26 07/08/2016   NA 140 07/08/2016   K 3.6 07/08/2016   CL 106 07/08/2016   CREATININE 0.85 07/08/2016   BUN 13 07/08/2016   CO2 27 07/08/2016   TSH 2.50 07/08/2016   INR 1.0 05/16/2009   HGBA1C 6.4 07/08/2016   MICROALBUR <0.7 12/16/2015    Dg Chest 2 View  Result Date: 12/23/2015 CLINICAL DATA:  Routine physical examination common no complaints, history of hypertension, hyperlipidemia, COPD, and diabetes. Recently discontinued smoking. EXAM: CHEST  2 VIEW COMPARISON:  PA and lateral chest x-ray of June 18, 2014 FINDINGS: The lungs are adequately inflated. The interstitial markings are coarse likely reflecting the patient's smoking history. There is no alveolar infiltrate. There is no pulmonary parenchymal mass or nodule. There is no pleural  effusion. The heart and pulmonary vascularity are normal. The mediastinum is normal in width. There is gentle stable S shaped thoracolumbar scoliosis. IMPRESSION: COPD.  No acute cardiopulmonary abnormality. Electronically Signed   By: David  Martinique M.D.   On: 12/23/2015 10:31    Assessment & Plan:   Kamaiyah was seen today for annual exam, hypertension, diabetes and hyperlipidemia.  Diagnoses and all orders for this visit:  Type 2 diabetes mellitus with complication, without long-term current use of insulin ([place])- Her A1c is 6.4%, blood sugars are adequately well-controlled. -     Comprehensive metabolic panel; Future -     Hemoglobin A1c; Future  Essential hypertension- her blood pressure is well-controlled, electrolytes and renal function are stable. -     Comprehensive metabolic panel; Future  Hypothyroidism, unspecified hypothyroidism type- her TSH is in the normal range, she will remain on the current dose of levothyroxine. -     TSH; Future  Hypercalcemia- her calcium level is normal now, will continue to follow this. -     Comprehensive metabolic panel; Future  Need for prophylactic vaccination and inoculation against influenza -     Flu vaccine HIGH DOSE  PF (Fluzone High dose)  Hypercholesteremia- she has achieved her LDL goal is doing well on the statin. -     Lipid panel; Future  Routine general medical examination at a health care facility   I am having Jackie Horton maintain her magnesium hydroxide, aspirin, CENTRUM SILVER ULTRA WOMENS, tamoxifen, amLODipine, losartan, potassium chloride, levothyroxine, pravastatin, and torsemide.  No orders of the defined types were placed in this encounter.  See AVS for instructions about healthy living and anticipatory guidance.  Follow-up: Return in about 6 months (around 01/03/2017).  Scarlette Calico, MD

## 2016-07-07 ENCOUNTER — Telehealth: Payer: Self-pay | Admitting: *Deleted

## 2016-07-07 NOTE — Telephone Encounter (Signed)
Notified pt w/MD response. Pt will come tomorrow to do labs...Jackie Horton

## 2016-07-07 NOTE — Telephone Encounter (Signed)
Rec'd call pt states she saw MD yesterday and he was going to send her something in for strength. She states she has no energy. Pls advise...Johny Chess

## 2016-07-07 NOTE — Telephone Encounter (Signed)
Called pt no answer x's 10 rings.../lmb 

## 2016-07-07 NOTE — Telephone Encounter (Signed)
I need to look at her lab work first to see if it's a thyroid issue. She told me she didn't have time yesterday to do the lab work. Please ask her to come back at her convenience to do the lab work and then I will make further recommendations after that.

## 2016-07-08 ENCOUNTER — Other Ambulatory Visit (INDEPENDENT_AMBULATORY_CARE_PROVIDER_SITE_OTHER): Payer: Medicare Other

## 2016-07-08 ENCOUNTER — Encounter: Payer: Self-pay | Admitting: Internal Medicine

## 2016-07-08 DIAGNOSIS — E7800 Pure hypercholesterolemia, unspecified: Secondary | ICD-10-CM

## 2016-07-08 DIAGNOSIS — E118 Type 2 diabetes mellitus with unspecified complications: Secondary | ICD-10-CM

## 2016-07-08 DIAGNOSIS — E039 Hypothyroidism, unspecified: Secondary | ICD-10-CM | POA: Diagnosis not present

## 2016-07-08 DIAGNOSIS — I10 Essential (primary) hypertension: Secondary | ICD-10-CM

## 2016-07-08 LAB — COMPREHENSIVE METABOLIC PANEL
ALBUMIN: 4.4 g/dL (ref 3.5–5.2)
ALK PHOS: 78 U/L (ref 39–117)
ALT: 30 U/L (ref 0–35)
AST: 26 U/L (ref 0–37)
BUN: 13 mg/dL (ref 6–23)
CO2: 27 mEq/L (ref 19–32)
CREATININE: 0.85 mg/dL (ref 0.40–1.20)
Calcium: 10.3 mg/dL (ref 8.4–10.5)
Chloride: 106 mEq/L (ref 96–112)
GFR: 82.55 mL/min (ref >60.00)
Glucose, Bld: 110 mg/dL — ABNORMAL HIGH (ref 70–99)
Potassium: 3.6 mEq/L (ref 3.5–5.1)
SODIUM: 140 meq/L (ref 135–145)
TOTAL PROTEIN: 7.7 g/dL (ref 6.0–8.3)
Total Bilirubin: 0.5 mg/dL (ref 0.2–1.2)

## 2016-07-08 LAB — LIPID PANEL
CHOL/HDL RATIO: 5
Cholesterol: 202 mg/dL — ABNORMAL HIGH (ref 0–200)
HDL: 40.4 mg/dL (ref >39.00)
LDL Cholesterol: 135 mg/dL — ABNORMAL HIGH (ref 0–99)
NONHDL: 161.41
Triglycerides: 132 mg/dL (ref 0.0–149.0)
VLDL: 26.4 mg/dL (ref 0.0–40.0)

## 2016-07-08 LAB — HEMOGLOBIN A1C: HEMOGLOBIN A1C: 6.4 % (ref 4.6–6.5)

## 2016-07-08 LAB — TSH: TSH: 2.5 u[IU]/mL (ref 0.35–4.50)

## 2016-07-08 NOTE — Assessment & Plan Note (Signed)

## 2016-07-14 ENCOUNTER — Ambulatory Visit: Payer: Medicare Other | Admitting: Internal Medicine

## 2016-07-22 ENCOUNTER — Other Ambulatory Visit: Payer: Self-pay | Admitting: Internal Medicine

## 2016-07-22 ENCOUNTER — Telehealth: Payer: Self-pay | Admitting: Internal Medicine

## 2016-07-22 DIAGNOSIS — F450 Somatization disorder: Principal | ICD-10-CM

## 2016-07-22 DIAGNOSIS — F32A Depression, unspecified: Secondary | ICD-10-CM | POA: Insufficient documentation

## 2016-07-22 DIAGNOSIS — F329 Major depressive disorder, single episode, unspecified: Secondary | ICD-10-CM

## 2016-07-22 MED ORDER — BUPROPION HCL ER (XL) 150 MG PO TB24: 150.0000 mg | ORAL_TABLET | Freq: Every day | ORAL | 1 refills | Status: DC

## 2016-07-22 NOTE — Telephone Encounter (Signed)
Please advise at your convenience 

## 2016-07-22 NOTE — Telephone Encounter (Signed)
I have sent a prescription to her pharmacy

## 2016-07-22 NOTE — Telephone Encounter (Signed)
Patient states that Dr. Ronnald Ramp was suppose to call a new medication into her pharmacy today but she does not know what the medication is.  Do you know anything about this?

## 2016-07-22 NOTE — Telephone Encounter (Signed)
Pt called and stated she had her lab work done and wanted to know if she needed something for strength. She stated he was suppose to send something in. Please follow up thanks.

## 2016-07-23 NOTE — Telephone Encounter (Signed)
Pt informed rx was sent to pharmacy.

## 2016-07-23 NOTE — Telephone Encounter (Signed)
Previous phone note addressing the same thing. Closing this note.

## 2016-08-03 ENCOUNTER — Other Ambulatory Visit: Payer: Self-pay | Admitting: Internal Medicine

## 2016-08-19 ENCOUNTER — Other Ambulatory Visit: Payer: Self-pay | Admitting: Internal Medicine

## 2016-09-15 ENCOUNTER — Other Ambulatory Visit: Payer: Self-pay | Admitting: Internal Medicine

## 2016-09-15 DIAGNOSIS — E890 Postprocedural hypothyroidism: Secondary | ICD-10-CM

## 2016-10-30 ENCOUNTER — Other Ambulatory Visit: Payer: Self-pay | Admitting: Internal Medicine

## 2017-01-26 ENCOUNTER — Encounter: Payer: Self-pay | Admitting: Internal Medicine

## 2017-01-26 ENCOUNTER — Other Ambulatory Visit (INDEPENDENT_AMBULATORY_CARE_PROVIDER_SITE_OTHER): Payer: Medicare Other

## 2017-01-26 ENCOUNTER — Ambulatory Visit (INDEPENDENT_AMBULATORY_CARE_PROVIDER_SITE_OTHER): Payer: Medicare Other | Admitting: Internal Medicine

## 2017-01-26 VITALS — BP 138/84 | HR 78 | Temp 97.5°F | Ht 62.0 in | Wt 123.0 lb

## 2017-01-26 DIAGNOSIS — I10 Essential (primary) hypertension: Secondary | ICD-10-CM

## 2017-01-26 DIAGNOSIS — E7800 Pure hypercholesterolemia, unspecified: Secondary | ICD-10-CM

## 2017-01-26 DIAGNOSIS — F450 Somatization disorder: Secondary | ICD-10-CM | POA: Diagnosis not present

## 2017-01-26 DIAGNOSIS — E118 Type 2 diabetes mellitus with unspecified complications: Secondary | ICD-10-CM | POA: Diagnosis not present

## 2017-01-26 DIAGNOSIS — F329 Major depressive disorder, single episode, unspecified: Secondary | ICD-10-CM | POA: Diagnosis not present

## 2017-01-26 DIAGNOSIS — E039 Hypothyroidism, unspecified: Secondary | ICD-10-CM

## 2017-01-26 LAB — COMPREHENSIVE METABOLIC PANEL
ALT: 26 U/L (ref 0–35)
AST: 19 U/L (ref 0–37)
Albumin: 4.8 g/dL (ref 3.5–5.2)
Alkaline Phosphatase: 87 U/L (ref 39–117)
BUN: 17 mg/dL (ref 6–23)
CALCIUM: 10.9 mg/dL — AB (ref 8.4–10.5)
CHLORIDE: 108 meq/L (ref 96–112)
CO2: 27 meq/L (ref 19–32)
Creatinine, Ser: 1.01 mg/dL (ref 0.40–1.20)
GFR: 67.56 mL/min (ref >60.00)
GLUCOSE: 94 mg/dL (ref 70–99)
Potassium: 4.1 mEq/L (ref 3.5–5.1)
Sodium: 140 mEq/L (ref 135–145)
Total Bilirubin: 0.5 mg/dL (ref 0.2–1.2)
Total Protein: 8.1 g/dL (ref 6.0–8.3)

## 2017-01-26 LAB — CBC WITH DIFFERENTIAL/PLATELET
BASOS ABS: 0 10*3/uL (ref 0.0–0.1)
Basophils Relative: 0.7 % (ref 0.0–3.0)
EOS PCT: 1.3 % (ref 0.0–5.0)
Eosinophils Absolute: 0.1 10*3/uL (ref 0.0–0.7)
HEMATOCRIT: 44.6 % (ref 36.0–46.0)
Hemoglobin: 15.3 g/dL — ABNORMAL HIGH (ref 12.0–15.0)
LYMPHS ABS: 3 10*3/uL (ref 0.7–4.0)
LYMPHS PCT: 42 % (ref 12.0–46.0)
MCHC: 34.3 g/dL (ref 30.0–36.0)
MCV: 93.4 fl (ref 78.0–100.0)
MONOS PCT: 8.3 % (ref 3.0–12.0)
Monocytes Absolute: 0.6 10*3/uL (ref 0.1–1.0)
NEUTROS PCT: 47.7 % (ref 43.0–77.0)
Neutro Abs: 3.4 10*3/uL (ref 1.4–7.7)
PLATELETS: 290 10*3/uL (ref 150.0–400.0)
RBC: 4.78 Mil/uL (ref 3.87–5.11)
RDW: 13.9 % (ref 11.5–15.5)
WBC: 7 10*3/uL (ref 4.0–10.5)

## 2017-01-26 LAB — HEMOGLOBIN A1C: Hgb A1c MFr Bld: 6 % (ref 4.6–6.5)

## 2017-01-26 LAB — LIPID PANEL
CHOL/HDL RATIO: 5
CHOLESTEROL: 263 mg/dL — AB (ref 0–200)
HDL: 51.9 mg/dL (ref >39.00)
LDL CALC: 189 mg/dL — AB (ref 0–99)
NonHDL: 211.51
Triglycerides: 114 mg/dL (ref 0.0–149.0)
VLDL: 22.8 mg/dL (ref 0.0–40.0)

## 2017-01-26 LAB — TSH: TSH: 16.83 u[IU]/mL — ABNORMAL HIGH (ref 0.35–4.50)

## 2017-01-26 MED ORDER — ROSUVASTATIN CALCIUM 10 MG PO TABS: 10.0000 mg | ORAL_TABLET | Freq: Every day | ORAL | 3 refills | Status: DC

## 2017-01-26 MED ORDER — LEVOTHYROXINE SODIUM 88 MCG PO TABS: 88.0000 ug | ORAL_TABLET | Freq: Every day | ORAL | 1 refills | Status: DC

## 2017-01-26 NOTE — Patient Instructions (Signed)
Hypothyroidism Hypothyroidism is a disorder of the thyroid. The thyroid is a large gland that is located in the lower front of the neck. The thyroid releases hormones that control how the body works. With hypothyroidism, the thyroid does not make enough of these hormones. What are the causes? Causes of hypothyroidism may include:  Viral infections.  Pregnancy.  Your own defense system (immune system) attacking your thyroid.  Certain medicines.  Birth defects.  Past radiation treatments to your head or neck.  Past treatment with radioactive iodine.  Past surgical removal of part or all of your thyroid.  Problems with the gland that is located in the center of your brain (pituitary).  What are the signs or symptoms? Signs and symptoms of hypothyroidism may include:  Feeling as though you have no energy (lethargy).  Inability to tolerate cold.  Weight gain that is not explained by a change in diet or exercise habits.  Dry skin.  Coarse hair.  Menstrual irregularity.  Slowing of thought processes.  Constipation.  Sadness or depression.  How is this diagnosed? Your health care provider may diagnose hypothyroidism with blood tests and ultrasound tests. How is this treated? Hypothyroidism is treated with medicine that replaces the hormones that your body does not make. After you begin treatment, it may take several weeks for symptoms to go away. Follow these instructions at home:  Take medicines only as directed by your health care provider.  If you start taking any new medicines, tell your health care provider.  Keep all follow-up visits as directed by your health care provider. This is important. As your condition improves, your dosage needs may change. You will need to have blood tests regularly so that your health care provider can watch your condition. Contact a health care provider if:  Your symptoms do not get better with treatment.  You are taking thyroid  replacement medicine and: ? You sweat excessively. ? You have tremors. ? You feel anxious. ? You lose weight rapidly. ? You cannot tolerate heat. ? You have emotional swings. ? You have diarrhea. ? You feel weak. Get help right away if:  You develop chest pain.  You develop an irregular heartbeat.  You develop a rapid heartbeat. This information is not intended to replace advice given to you by your health care provider. Make sure you discuss any questions you have with your health care provider. Document Released: 10/12/2005 Document Revised: 03/19/2016 Document Reviewed: 02/27/2014 Elsevier Interactive Patient Education  2017 Else[employer]  

## 2017-01-26 NOTE — Progress Notes (Signed)
Pre visit review using our clinic review tool, if applicable. No additional management support is needed unless otherwise documented below in the visit note. 

## 2017-01-26 NOTE — Progress Notes (Signed)
Subjective:  Patient ID: Jackie Horton, female    DOB: July 28, 1933  Age: 83 y.o. MRN: [id number]  CC: Hypertension; Hypothyroidism; and Hyperlipidemia   HPI Jackie Horton presents for f/up - she complains of fatigue. She has not been taking the statin.  Outpatient Medications Prior to Visit  Medication Sig Dispense Refill  . amLODipine (NORVASC) 10 MG tablet Take 1 tablet (10 mg total) by mouth daily. 90 tablet 1  . aspirin 81 MG tablet Take 81 mg by mouth daily.    Marland Kitchen losartan (COZAAR) 50 MG tablet TAKE ONE TABLET BY MOUTH ONCE DAILY 90 tablet 3  . magnesium hydroxide (MILK OF MAGNESIA) 400 MG/5ML suspension Take 15 mLs by mouth daily as needed for constipation.    . Multiple Vitamins-Minerals (CENTRUM SILVER ULTRA WOMENS) TABS Take 1 tablet by mouth daily.    . potassium chloride (K-DUR) 10 MEQ tablet TAKE ONE TABLET EACH DAY 90 tablet 3  . tamoxifen (NOLVADEX) 20 MG tablet TAKE ONE TABLET EACH DAY 90 tablet 4  . torsemide (DEMADEX) 20 MG tablet Take 1 tablet (20 mg total) by mouth daily. 90 tablet 1  . triamterene-hydrochlorothiazide (MAXZIDE-25) 37.5-25 MG tablet TAKE ONE-HALF TABLET DAILY 45 tablet 1  . buPROPion (WELLBUTRIN XL) 150 MG 24 hr tablet Take 1 tablet (150 mg total) by mouth daily. 90 tablet 1  . levothyroxine (SYNTHROID, LEVOTHROID) 50 MCG tablet TAKE ONE TABLET BY MOUTH ONCE DAILY 90 tablet 1  . pravastatin (PRAVACHOL) 20 MG tablet TAKE ONE TABLET BY MOUTH ONCE DAILY 90 tablet 3   No facility-administered medications prior to visit.     ROS Review of Systems  Constitutional: Positive for fatigue. Negative for appetite change, chills, diaphoresis and unexpected weight change.  HENT: Negative.   Eyes: Negative.  Negative for visual disturbance.  Respiratory: Negative for cough, chest tightness, shortness of breath and wheezing.   Cardiovascular: Negative for chest pain, palpitations and leg swelling.  Gastrointestinal: Negative for abdominal pain,  constipation, diarrhea, nausea and vomiting.  Endocrine: Negative for cold intolerance and heat intolerance.  Genitourinary: Negative.  Negative for difficulty urinating.  Musculoskeletal: Negative for arthralgias, back pain, joint swelling and myalgias.  Skin: Negative.  Negative for color change and rash.  Allergic/Immunologic: Negative.   Neurological: Negative.   Hematological: Negative for adenopathy. Does not bruise/bleed easily.  Psychiatric/Behavioral: Negative.     Objective:  BP 138/84 (BP Location: Right Arm, Patient Position: Sitting, Cuff Size: Normal)   Pulse 78   Temp 97.5 F (36.4 C) (Oral)   Ht 5\' 2"  (1.575 m)   Wt 123 lb (55.8 kg)   SpO2 98%   BMI 22.50 kg/m   BP Readings from Last 3 Encounters:  01/26/17 138/84  07/06/16 128/74  04/14/16 130/80    Wt Readings from Last 3 Encounters:  01/26/17 123 lb (55.8 kg)  07/06/16 123 lb 12 oz (56.1 kg)  04/14/16 129 lb (58.5 kg)    Physical Exam  Constitutional: She is oriented to person, place, and time. No distress.  HENT:  Mouth/Throat: Oropharynx is clear and moist. No oropharyngeal exudate.  Eyes: Conjunctivae are normal. Right eye exhibits no discharge. Left eye exhibits no discharge. No scleral icterus.  Neck: Normal range of motion. Neck supple. No JVD present. No tracheal deviation present. No thyromegaly present.  Cardiovascular: Normal rate, regular rhythm, normal heart sounds and intact distal pulses.  Exam reveals no gallop and no friction rub.   No murmur heard. Pulmonary/Chest: Effort normal and breath  sounds normal. No stridor. No respiratory distress. She has no wheezes. She has no rales. She exhibits no tenderness.  Abdominal: Soft. Bowel sounds are normal. She exhibits no distension and no mass. There is no tenderness. There is no rebound and no guarding.  Musculoskeletal: Normal range of motion. She exhibits no edema, tenderness or deformity.  Lymphadenopathy:    She has no cervical  adenopathy.  Neurological: She is oriented to person, place, and time.  Skin: Skin is warm and dry. No rash noted. She is not diaphoretic. No erythema. No pallor.  Vitals reviewed.   Lab Results  Component Value Date   WBC 7.0 01/26/2017   HGB 15.3 (H) 01/26/2017   HCT 44.6 01/26/2017   PLT 290.0 01/26/2017   GLUCOSE 94 01/26/2017   CHOL 263 (H) 01/26/2017   TRIG 114.0 01/26/2017   HDL 51.90 01/26/2017   LDLCALC 189 (H) 01/26/2017   ALT 26 01/26/2017   AST 19 01/26/2017   NA 140 01/26/2017   K 4.1 01/26/2017   CL 108 01/26/2017   CREATININE 1.01 01/26/2017   BUN 17 01/26/2017   CO2 27 01/26/2017   TSH 16.83 (H) 01/26/2017   INR 1.0 05/16/2009   HGBA1C 6.0 01/26/2017   MICROALBUR <0.7 12/16/2015    Dg Chest 2 View  Result Date: 12/23/2015 CLINICAL DATA:  Routine physical examination common no complaints, history of hypertension, hyperlipidemia, COPD, and diabetes. Recently discontinued smoking. EXAM: CHEST  2 VIEW COMPARISON:  PA and lateral chest x-ray of June 18, 2014 FINDINGS: The lungs are adequately inflated. The interstitial markings are coarse likely reflecting the patient's smoking history. There is no alveolar infiltrate. There is no pulmonary parenchymal mass or nodule. There is no pleural effusion. The heart and pulmonary vascularity are normal. The mediastinum is normal in width. There is gentle stable S shaped thoracolumbar scoliosis. IMPRESSION: COPD.  No acute cardiopulmonary abnormality. Electronically Signed   By: David  Martinique M.D.   On: 12/23/2015 10:31    Assessment & Plan:   Jackie Horton was seen today for hypertension, hypothyroidism and hyperlipidemia.  Diagnoses and all orders for this visit:  Essential hypertension- her blood pressure is adequately well-controlled -     Comprehensive metabolic panel; Future -     CBC with Differential/Platelet; Future  Acquired hypothyroidism- Her TSH is up to 17 and she complains of fatigue so I have increased her  levothyroxine dose -     TSH; Future -     levothyroxine (SYNTHROID, LEVOTHROID) 88 MCG tablet; Take 1 tablet (88 mcg total) by mouth daily.  Type 2 diabetes mellitus with complication, without long-term current use of insulin ([facility]) -     Hemoglobin A1c; Future  Hypercalcemia- her calcium level remains slightly elevated at 10.9 mg/dl, I will continue to monitor for the development of hyperparathyroidism. This is most likely related to the thiazide diuretic therapy. She is asymptomatic with respect to this. -     Comprehensive metabolic panel; Future -     PTH, intact and calcium; Future  Hypercholesteremia- she has not achieved her LDL goal and has an elevated Framingham risk score so I've asked her to restart statin therapy but this time with a more potent statin. -     Lipid panel; Future -     rosuvastatin (CRESTOR) 10 MG tablet; Take 1 tablet (10 mg total) by mouth daily.  Depression with somatization   I have discontinued Ms. Sonnenberg's pravastatin, buPROPion, and levothyroxine. I am also having her start on  levothyroxine and rosuvastatin. Additionally, I am having her maintain her magnesium hydroxide, aspirin, CENTRUM SILVER ULTRA WOMENS, tamoxifen, torsemide, amLODipine, losartan, triamterene-hydrochlorothiazide, and potassium chloride.  Meds ordered this encounter  Medications  . levothyroxine (SYNTHROID, LEVOTHROID) 88 MCG tablet    Sig: Take 1 tablet (88 mcg total) by mouth daily.    Dispense:  90 tablet    Refill:  1  . rosuvastatin (CRESTOR) 10 MG tablet    Sig: Take 1 tablet (10 mg total) by mouth daily.    Dispense:  90 tablet    Refill:  3     Follow-up: Return in about 6 months (around 07/28/2017).  Jackie Calico, MD

## 2017-01-27 LAB — PTH, INTACT AND CALCIUM
Calcium: 10.4 mg/dL (ref 8.6–10.4)
PTH: 32 pg/mL (ref 14–64)

## 2017-01-28 DIAGNOSIS — Z1231 Encounter for screening mammogram for malignant neoplasm of breast: Secondary | ICD-10-CM | POA: Diagnosis not present

## 2017-01-28 DIAGNOSIS — Z853 Personal history of malignant neoplasm of breast: Secondary | ICD-10-CM | POA: Diagnosis not present

## 2017-01-28 LAB — HM MAMMOGRAPHY

## 2017-02-11 ENCOUNTER — Encounter: Payer: Self-pay | Admitting: Internal Medicine

## 2017-02-11 NOTE — Progress Notes (Signed)
Result abstracted 

## 2017-03-08 ENCOUNTER — Ambulatory Visit (INDEPENDENT_AMBULATORY_CARE_PROVIDER_SITE_OTHER): Payer: Medicare Other | Admitting: Ophthalmology

## 2017-03-15 ENCOUNTER — Other Ambulatory Visit: Payer: Self-pay | Admitting: Internal Medicine

## 2017-04-27 ENCOUNTER — Other Ambulatory Visit: Payer: Self-pay | Admitting: Internal Medicine

## 2017-04-27 DIAGNOSIS — E890 Postprocedural hypothyroidism: Secondary | ICD-10-CM

## 2017-04-27 NOTE — Telephone Encounter (Signed)
Refused rx fill of the 50 mcg levothyroxin. Strength increased to 88 mcg in April. Message sent to pharmacy.

## 2017-07-26 ENCOUNTER — Telehealth: Payer: Self-pay | Admitting: Internal Medicine

## 2017-07-26 DIAGNOSIS — E039 Hypothyroidism, unspecified: Secondary | ICD-10-CM

## 2017-07-27 ENCOUNTER — Ambulatory Visit: Payer: Medicare Other | Admitting: Internal Medicine

## 2017-07-27 MED ORDER — LEVOTHYROXINE SODIUM 88 MCG PO TABS: 88.0000 ug | ORAL_TABLET | Freq: Every day | ORAL | 0 refills | Status: DC

## 2017-07-27 NOTE — Addendum Note (Signed)
Addended by: Aviva Signs M on: 07/27/2017 03:58 PM   Modules accepted: Orders

## 2017-07-27 NOTE — Telephone Encounter (Signed)
Called pt to let her know.

## 2017-07-27 NOTE — Telephone Encounter (Signed)
Pt called regarding this medication. She said that she is out and needs it. She has an appointment scheduled on 08/10/2017 with Dr Ronnald Ramp. It was originally scheduled for today but had to be rescheduled because Dr Ronnald Ramp was out of the office. Please advise.

## 2017-07-27 NOTE — Telephone Encounter (Signed)
30 day supply sent

## 2017-08-10 ENCOUNTER — Encounter: Payer: Self-pay | Admitting: Internal Medicine

## 2017-08-10 ENCOUNTER — Ambulatory Visit (INDEPENDENT_AMBULATORY_CARE_PROVIDER_SITE_OTHER): Payer: Medicare Other | Admitting: Internal Medicine

## 2017-08-10 VITALS — BP 134/74 | HR 90 | Temp 97.6°F | Resp 16 | Ht 62.0 in | Wt 116.1 lb

## 2017-08-10 DIAGNOSIS — E118 Type 2 diabetes mellitus with unspecified complications: Secondary | ICD-10-CM | POA: Diagnosis not present

## 2017-08-10 DIAGNOSIS — E038 Other specified hypothyroidism: Secondary | ICD-10-CM | POA: Diagnosis not present

## 2017-08-10 DIAGNOSIS — Z23 Encounter for immunization: Secondary | ICD-10-CM | POA: Diagnosis not present

## 2017-08-10 DIAGNOSIS — I10 Essential (primary) hypertension: Secondary | ICD-10-CM | POA: Diagnosis not present

## 2017-08-10 DIAGNOSIS — E7800 Pure hypercholesterolemia, unspecified: Secondary | ICD-10-CM

## 2017-08-10 MED ORDER — ZOSTER VAC RECOMB ADJUVANTED 50 MCG/0.5ML IM SUSR: 0.5000 mL | Freq: Once | INTRAMUSCULAR | 1 refills | Status: AC

## 2017-08-10 NOTE — Patient Instructions (Signed)
Hypothyroidism Hypothyroidism is a disorder of the thyroid. The thyroid is a large gland that is located in the lower front of the neck. The thyroid releases hormones that control how the body works. With hypothyroidism, the thyroid does not make enough of these hormones. What are the causes? Causes of hypothyroidism may include:  Viral infections.  Pregnancy.  Your own defense system (immune system) attacking your thyroid.  Certain medicines.  Birth defects.  Past radiation treatments to your head or neck.  Past treatment with radioactive iodine.  Past surgical removal of part or all of your thyroid.  Problems with the gland that is located in the center of your brain (pituitary).  What are the signs or symptoms? Signs and symptoms of hypothyroidism may include:  Feeling as though you have no energy (lethargy).  Inability to tolerate cold.  Weight gain that is not explained by a change in diet or exercise habits.  Dry skin.  Coarse hair.  Menstrual irregularity.  Slowing of thought processes.  Constipation.  Sadness or depression.  How is this diagnosed? Your health care provider may diagnose hypothyroidism with blood tests and ultrasound tests. How is this treated? Hypothyroidism is treated with medicine that replaces the hormones that your body does not make. After you begin treatment, it may take several weeks for symptoms to go away. Follow these instructions at home:  Take medicines only as directed by your health care provider.  If you start taking any new medicines, tell your health care provider.  Keep all follow-up visits as directed by your health care provider. This is important. As your condition improves, your dosage needs may change. You will need to have blood tests regularly so that your health care provider can watch your condition. Contact a health care provider if:  Your symptoms do not get better with treatment.  You are taking thyroid  replacement medicine and: ? You sweat excessively. ? You have tremors. ? You feel anxious. ? You lose weight rapidly. ? You cannot tolerate heat. ? You have emotional swings. ? You have diarrhea. ? You feel weak. Get help right away if:  You develop chest pain.  You develop an irregular heartbeat.  You develop a rapid heartbeat. This information is not intended to replace advice given to you by your health care provider. Make sure you discuss any questions you have with your health care provider. Document Released: 10/12/2005 Document Revised: 03/19/2016 Document Reviewed: 02/27/2014 Elsevier Interactive Patient Education  2017 Else[employer]  

## 2017-08-10 NOTE — Progress Notes (Signed)
Subjective:  Patient ID: Jackie Horton, female    DOB: 1933/05/22  Age: 83 y.o. MRN: [id number]  CC: Hypertension; Hyperlipidemia; Hypothyroidism; and Diabetes   HPI Jackie Horton presents for f/up. She feels well and offers no complaints.  Outpatient Medications Prior to Visit  Medication Sig Dispense Refill  . amLODipine (NORVASC) 10 MG tablet TAKE ONE TABLET BY MOUTH ONCE DAILY 90 tablet 1  . aspirin 81 MG tablet Take 81 mg by mouth daily.    Marland Kitchen levothyroxine (SYNTHROID, LEVOTHROID) 88 MCG tablet Take 1 tablet (88 mcg total) by mouth daily. 30 tablet 0  . losartan (COZAAR) 50 MG tablet TAKE ONE TABLET BY MOUTH ONCE DAILY 90 tablet 3  . potassium chloride (K-DUR) 10 MEQ tablet TAKE ONE TABLET EACH DAY 90 tablet 3  . rosuvastatin (CRESTOR) 10 MG tablet Take 1 tablet (10 mg total) by mouth daily. 90 tablet 3  . tamoxifen (NOLVADEX) 20 MG tablet TAKE ONE TABLET EACH DAY 90 tablet 4  . triamterene-hydrochlorothiazide (MAXZIDE-25) 37.5-25 MG tablet TAKE ONE-HALF TABLET DAILY 45 tablet 1  . magnesium hydroxide (MILK OF MAGNESIA) 400 MG/5ML suspension Take 15 mLs by mouth daily as needed for constipation.    . Multiple Vitamins-Minerals (CENTRUM SILVER ULTRA WOMENS) TABS Take 1 tablet by mouth daily.    Marland Kitchen torsemide (DEMADEX) 20 MG tablet Take 1 tablet (20 mg total) by mouth daily. 90 tablet 1   No facility-administered medications prior to visit.     ROS Review of Systems  Constitutional: Negative.  Negative for chills, diaphoresis, fatigue and fever.  HENT: Negative.  Negative for sinus pressure and trouble swallowing.   Eyes: Negative for visual disturbance.  Respiratory: Negative.  Negative for cough, chest tightness, shortness of breath and wheezing.   Cardiovascular: Negative for chest pain, palpitations and leg swelling.  Gastrointestinal: Negative for abdominal pain, constipation, diarrhea, nausea and vomiting.  Endocrine: Negative.   Genitourinary: Negative.  Negative  for difficulty urinating.  Musculoskeletal: Negative for arthralgias, back pain, myalgias and neck pain.  Skin: Negative.   Allergic/Immunologic: Negative.   Neurological: Negative.  Negative for dizziness, weakness and headaches.  Hematological: Negative for adenopathy. Does not bruise/bleed easily.  Psychiatric/Behavioral: Negative.     Objective:  BP 134/74 (BP Location: Left Arm, Patient Position: Sitting, Cuff Size: Normal)   Pulse 90   Temp 97.6 F (36.4 C) (Oral)   Resp 16   Ht 5\' 2"  (1.575 m)   Wt 116 lb 2 oz (52.7 kg)   SpO2 96%   BMI 21.24 kg/m   BP Readings from Last 3 Encounters:  08/10/17 134/74  01/26/17 138/84  07/06/16 128/74    Wt Readings from Last 3 Encounters:  08/10/17 116 lb 2 oz (52.7 kg)  01/26/17 123 lb (55.8 kg)  07/06/16 123 lb 12 oz (56.1 kg)    Physical Exam  Constitutional: She is oriented to person, place, and time. No distress.  HENT:  Mouth/Throat: Oropharynx is clear and moist. No oropharyngeal exudate.  Eyes: Conjunctivae are normal. Right eye exhibits no discharge. Left eye exhibits no discharge. No scleral icterus.  Neck: Normal range of motion. Neck supple. No JVD present. No thyromegaly present.  Cardiovascular: Normal rate, regular rhythm and intact distal pulses.  Exam reveals no gallop and no friction rub.   No murmur heard. Pulmonary/Chest: Effort normal and breath sounds normal. No respiratory distress. She has no wheezes. She has no rales. She exhibits no tenderness.  Abdominal: Soft. Bowel sounds are normal.  She exhibits no distension and no mass. There is no tenderness. There is no rebound and no guarding.  Musculoskeletal: Normal range of motion. She exhibits no edema, tenderness or deformity.  Lymphadenopathy:    She has no cervical adenopathy.  Neurological: She is alert and oriented to person, place, and time.  Skin: Skin is warm and dry. No rash noted. She is not diaphoretic. No erythema. No pallor.  Vitals  reviewed.   Lab Results  Component Value Date   WBC 7.0 01/26/2017   HGB 15.3 (H) 01/26/2017   HCT 44.6 01/26/2017   PLT 290.0 01/26/2017   GLUCOSE 94 01/26/2017   CHOL 263 (H) 01/26/2017   TRIG 114.0 01/26/2017   HDL 51.90 01/26/2017   LDLCALC 189 (H) 01/26/2017   ALT 26 01/26/2017   AST 19 01/26/2017   NA 140 01/26/2017   K 4.1 01/26/2017   CL 108 01/26/2017   CREATININE 1.01 01/26/2017   BUN 17 01/26/2017   CO2 27 01/26/2017   TSH 16.83 (H) 01/26/2017   INR 1.0 05/16/2009   HGBA1C 6.0 01/26/2017   MICROALBUR <0.7 12/16/2015    Dg Chest 2 View  Result Date: 12/23/2015 CLINICAL DATA:  Routine physical examination common no complaints, history of hypertension, hyperlipidemia, COPD, and diabetes. Recently discontinued smoking. EXAM: CHEST  2 VIEW COMPARISON:  PA and lateral chest x-ray of June 18, 2014 FINDINGS: The lungs are adequately inflated. The interstitial markings are coarse likely reflecting the patient's smoking history. There is no alveolar infiltrate. There is no pulmonary parenchymal mass or nodule. There is no pleural effusion. The heart and pulmonary vascularity are normal. The mediastinum is normal in width. There is gentle stable S shaped thoracolumbar scoliosis. IMPRESSION: COPD.  No acute cardiopulmonary abnormality. Electronically Signed   By: David  Martinique M.D.   On: 12/23/2015 10:31    Assessment & Plan:   Jackie Horton was seen today for hypertension, hyperlipidemia, hypothyroidism and diabetes.  Diagnoses and all orders for this visit:  Need for influenza vaccination -     Flu vaccine HIGH DOSE PF (Fluzone High dose)  Essential hypertension- her blood pressure is well-controlled. Will monitor her electrolytes and renal function. -     Comprehensive metabolic panel; Future -     Urinalysis, Routine w reflex microscopic; Future  Hypercalcemia- [name] -     Comprehensive metabolic panel;  Future  Other specified hypothyroidism- will check her TSH and will adjust her Synthroid dose if indicated. -     TSH; Future  Type 2 diabetes mellitus with complication, without long-term current use of insulin ([place])- will monitor her A1c and will treat if indicated. -     Comprehensive metabolic panel; Future -     Hemoglobin A1c; Future -     Microalbumin / creatinine urine ratio; Future  Hypercholesteremia- I will recheck her LDL to see if she has achieved her goal. -     Lipid panel; Future  Need for pneumococcal vaccination -     Pneumococcal polysaccharide vaccine 23-valent greater than or equal to 2yo subcutaneous/IM  Other orders -     Zoster Vaccine Adjuvanted [facility]) injection; Inject 0.5 mLs into the muscle once.   I have discontinued Jackie Horton magnesium hydroxide, CENTRUM SILVER ULTRA WOMENS, and torsemide. I am also having her start on Zoster Vaccine Adjuvanted. Additionally, I am having her maintain her aspirin, tamoxifen, losartan, potassium chloride, rosuvastatin, triamterene-hydrochlorothiazide, amLODipine, and levothyroxine.  Meds ordered  this encounter  Medications  . Zoster Vaccine Adjuvanted [facility]) injection    Sig: Inject 0.5 mLs into the muscle once.    Dispense:  0.5 mL    Refill:  1     Follow-up: Return in about 4 months (around 12/11/2017).  Scarlette Calico, MD

## 2017-08-16 NOTE — Progress Notes (Signed)
Pre visit review using our clinic review tool, if applicable. No additional management support is needed unless otherwise documented below in the visit note. 

## 2017-08-16 NOTE — Progress Notes (Addendum)
Subjective:   Jackie Horton is a 83 y.o. female who presents for Medicare Annual (Subsequent) preventive examination.  Review of Systems:  No ROS.  Medicare Wellness Visit. Additional risk factors are reflected in the social history.  Cardiac Risk Factors include: advanced age (>55men, >45 women);dyslipidemia;smoking/ tobacco exposure Sleep patterns: feels rested on waking, gets up 1-2 times nightly to void and sleeps 7-8 hours nightly.    Home Safety/Smoke Alarms: Feels safe in home. Smoke alarms in place.  Living environment; residence and Firearm Safety: 1-story house/ trailer, no firearms.Lives alone, no needs for DME, good support system Seat Belt Safety/Bike Helmet: Wears seat belt.     Objective:     Vitals: BP 132/72 (BP Location: Left Arm, Cuff Size: Normal)   Pulse (!) 101   Wt 114 lb (51.7 kg)   SpO2 98%   BMI 20.85 kg/m   Body mass index is 20.85 kg/m.   Tobacco History  Smoking Status  . Current Some Day Smoker  . Packs/day: 0.25  . Types: Cigarettes  . Last attempt to quit: 10/14/2015  Smokeless Tobacco  . Never Used     Ready to quit: Not Answered Counseling given: Not Answered   Past Medical History:  Diagnosis Date  . Breast cancer ([place])   . Cigarette smoker   . Constipation   . Hypercholesteremia   . Hypertension   . Hypothyroid   . Osteoarthritis    Past Surgical History:  Procedure Laterality Date  . bladder tact  2011  . BREAST LUMPECTOMY  2009  . PARTIAL HYSTERECTOMY  1972  . ROTATOR CUFF REPAIR     Family History  Problem Relation Age of Onset  . Breast cancer Daughter   . Diabetes Daughter   . Breast cancer Sister        3 sisters  . Aneurysm Father    History  Sexual Activity  . Sexual activity: Yes  . Birth control/ protection: Post-menopausal, Surgical    Outpatient Encounter Prescriptions as of 08/17/2017  Medication Sig  . amLODipine (NORVASC) 10 MG tablet TAKE ONE TABLET BY MOUTH ONCE DAILY  . Ascorbic  Acid (VITAMIN C PO) Take 1 tablet by mouth daily.  Marland Kitchen aspirin 81 MG tablet Take 81 mg by mouth daily.  . Cholecalciferol (VITAMIN D PO) Take 1 tablet by mouth daily.  Marland Kitchen ibuprofen (ADVIL,MOTRIN) 200 MG tablet Take 200 mg by mouth every 6 (six) hours as needed.  Marland Kitchen levothyroxine (SYNTHROID, LEVOTHROID) 88 MCG tablet Take 1 tablet (88 mcg total) by mouth daily.  Marland Kitchen losartan (COZAAR) 50 MG tablet TAKE ONE TABLET BY MOUTH ONCE DAILY  . potassium chloride (K-DUR) 10 MEQ tablet TAKE ONE TABLET EACH DAY  . rosuvastatin (CRESTOR) 10 MG tablet Take 1 tablet (10 mg total) by mouth daily.  . tamoxifen (NOLVADEX) 20 MG tablet TAKE ONE TABLET EACH DAY  . triamterene-hydrochlorothiazide (MAXZIDE-25) 37.5-25 MG tablet TAKE ONE-HALF TABLET DAILY   No facility-administered encounter medications on file as of 08/17/2017.     Activities of Daily Living In your present state of health, do you have any difficulty performing the following activities: 08/17/2017  Hearing? N  Vision? N  Difficulty concentrating or making decisions? Y  Walking or climbing stairs? N  Dressing or bathing? N  Doing errands, shopping? N  Preparing Food and eating ? N  Using the Toilet? N  In the past six months, have you accidently leaked urine? N  Do you have problems with loss of bowel  control? N  Managing your Medications? N  Managing your Finances? N  Housekeeping or managing your Housekeeping? N  Some recent data might be hidden    Patient Care Team: Janith Lima, MD as PCP - General (Internal Medicine)    Assessment:    Physical assessment deferred to PCP.  Exercise Activities and Dietary recommendations Current Exercise Habits: The patient does not participate in regular exercise at present, Exercise limited by: None identified  Diet (meal preparation, eat out, water intake, caffeinated beverages, dairy products, fruits and vegetables): in general, a "healthy" diet  , well balanced   Reviewed heart healthy diet,  encouraged patient to increase daily water intake.    Goals    . Maintain current health status          Stay as healthy and as independent as possible      Fall Risk Fall Risk  08/17/2017 08/10/2017 01/26/2017 07/08/2016 06/10/2015  Falls in the past year? No No No No No   Depression Screen PHQ 2/9 Scores 08/17/2017 08/10/2017 01/26/2017 07/08/2016  PHQ - 2 Score 1 0 - 0  PHQ- 9 Score 1 - - -  Exception Documentation - - Medical reason -     Cognitive Function MMSE - Mini Mental State Exam 08/17/2017  Orientation to time 4  Orientation to Place 5  Registration 3  Attention/ Calculation 3  Recall 2  Language- name 2 objects 2  Language- repeat 1  Language- follow 3 step command 3  Language- read & follow direction 1  Write a sentence 1  Copy design 1  Total score 26        Immunization History  Administered Date(s) Administered  . Influenza Split 07/25/2012  . Influenza, High Dose Seasonal PF 10/14/2015, 07/06/2016, 08/10/2017  . Influenza,inj,Quad PF,6+ Mos 07/20/2013, 10/08/2014  . Pneumococcal Conjugate-13 02/21/2014  . Pneumococcal Polysaccharide-23 03/16/2012, 08/10/2017  . Tdap 02/21/2014   Screening Tests Health Maintenance  Topic Date Due  . OPHTHALMOLOGY EXAM  10/08/2016  . HEMOGLOBIN A1C  07/28/2017  . DEXA SCAN  08/17/2018 (Originally 07/07/2000)  . FOOT EXAM  08/17/2018  . TETANUS/TDAP  02/22/2024  . INFLUENZA VACCINE  Completed  . PNA vac Low Risk Adult  Completed      Plan:   Continue doing brain stimulating activities (puzzles, reading, adult coloring books, staying active) to keep memory sharp.   Continue to eat heart healthy diet (full of fruits, vegetables, whole grains, lean protein, water--limit salt, fat, and sugar intake) and increase physical activity as tolerated.  I have personally reviewed and noted the following in the patient's chart:   . Medical and social history . Use of alcohol, tobacco or illicit drugs  . Current  medications and supplements . Functional ability and status . Nutritional status . Physical activity . Advanced directives . List of other physicians . Vitals . Screenings to include cognitive, depression, and falls . Referrals and appointments  In addition, I have reviewed and discussed with patient certain preventive protocols, quality metrics, and best practice recommendations. A written personalized care plan for preventive services as well as general preventive health recommendations were provided to patient.   Medical screening examination/treatment/procedure(s) were performed by non-physician practitioner and as supervising physician I was immediately available for consultation/collaboration. I agree with above. Scarlette Calico, MD   Michiel Cowboy, RN  08/17/2017

## 2017-08-17 ENCOUNTER — Ambulatory Visit (INDEPENDENT_AMBULATORY_CARE_PROVIDER_SITE_OTHER): Payer: Medicare Other | Admitting: *Deleted

## 2017-08-17 VITALS — BP 132/72 | HR 101 | Wt 114.0 lb

## 2017-08-17 DIAGNOSIS — E118 Type 2 diabetes mellitus with unspecified complications: Secondary | ICD-10-CM

## 2017-08-17 DIAGNOSIS — Z0000 Encounter for general adult medical examination without abnormal findings: Secondary | ICD-10-CM | POA: Diagnosis not present

## 2017-08-17 NOTE — Patient Instructions (Addendum)
Continue doing brain stimulating activities (puzzles, reading, adult coloring books, staying active) to keep memory sharp.   Continue to eat heart healthy diet (full of fruits, vegetables, whole grains, lean protein, water--limit salt, fat, and sugar intake) and increase physical activity as tolerated.   Jackie Horton , Thank you for taking time to come for your Medicare Wellness Visit. I appreciate your ongoing commitment to your health goals. Please review the following plan we discussed and let me know if I can assist you in the future.   These are the goals we discussed: Goals    . Maintain current health status          Stay as healthy and as independent as possible       This is a list of the screening recommended for you and due dates:  Health Maintenance  Topic Date Due  . DEXA scan (bone density measurement)  07/07/2000  . Eye exam for diabetics  10/08/2016  . Complete foot exam   12/15/2016  . Hemoglobin A1C  07/28/2017  . Tetanus Vaccine  02/22/2024  . Flu Shot  Completed  . Pneumonia vaccines  Completed    Fall Prevention in the Home Falls can cause injuries. They can happen to people of all ages. There are many things you can do to make your home safe and to help prevent falls. What can I do on the outside of my home?  Regularly fix the edges of walkways and driveways and fix any cracks.  Remove anything that might make you trip as you walk through a door, such as a raised step or threshold.  Trim any bushes or trees on the path to your home.  Use bright outdoor lighting.  Clear any walking paths of anything that might make someone trip, such as rocks or tools.  Regularly check to see if handrails are loose or broken. Make sure that both sides of any steps have handrails.  Any raised decks and porches should have guardrails on the edges.  Have any leaves, snow, or ice cleared regularly.  Use sand or salt on walking paths during winter.  Clean up any spills  in your garage right away. This includes oil or grease spills. What can I do in the bathroom?  Use night lights.  Install grab bars by the toilet and in the tub and shower. Do not use towel bars as grab bars.  Use non-skid mats or decals in the tub or shower.  If you need to sit down in the shower, use a plastic, non-slip stool.  Keep the floor dry. Clean up any water that spills on the floor as soon as it happens.  Remove soap buildup in the tub or shower regularly.  Attach bath mats securely with double-sided non-slip rug tape.  Do not have throw rugs and other things on the floor that can make you trip. What can I do in the bedroom?  Use night lights.  Make sure that you have a light by your bed that is easy to reach.  Do not use any sheets or blankets that are too big for your bed. They should not hang down onto the floor.  Have a firm chair that has side arms. You can use this for support while you get dressed.  Do not have throw rugs and other things on the floor that can make you trip. What can I do in the kitchen?  Clean up any spills right away.  Avoid walking on  wet floors.  Keep items that you use a lot in easy-to-reach places.  If you need to reach something above you, use a strong step stool that has a grab bar.  Keep electrical cords out of the way.  Do not use floor polish or wax that makes floors slippery. If you must use wax, use non-skid floor wax.  Do not have throw rugs and other things on the floor that can make you trip. What can I do with my stairs?  Do not leave any items on the stairs.  Make sure that there are handrails on both sides of the stairs and use them. Fix handrails that are broken or loose. Make sure that handrails are as long as the stairways.  Check any carpeting to make sure that it is firmly attached to the stairs. Fix any carpet that is loose or worn.  Avoid having throw rugs at the top or bottom of the stairs. If you do  have throw rugs, attach them to the floor with carpet tape.  Make sure that you have a light switch at the top of the stairs and the bottom of the stairs. If you do not have them, ask someone to add them for you. What else can I do to help prevent falls?  Wear shoes that: ? Do not have high heels. ? Have rubber bottoms. ? Are comfortable and fit you well. ? Are closed at the toe. Do not wear sandals.  If you use a stepladder: ? Make sure that it is fully opened. Do not climb a closed stepladder. ? Make sure that both sides of the stepladder are locked into place. ? Ask someone to hold it for you, if possible.  Clearly mark and make sure that you can see: ? Any grab bars or handrails. ? First and last steps. ? Where the edge of each step is.  Use tools that help you move around (mobility aids) if they are needed. These include: ? Canes. ? Walkers. ? Scooters. ? Crutches.  Turn on the lights when you go into a dark area. Replace any light bulbs as soon as they burn out.  Set up your furniture so you have a clear path. Avoid moving your furniture around.  If any of your floors are uneven, fix them.  If there are any pets around you, be aware of where they are.  Review your medicines with your doctor. Some medicines can make you feel dizzy. This can increase your chance of falling. Ask your doctor what other things that you can do to help prevent falls. This information is not intended to replace advice given to you by your health care provider. Make sure you discuss any questions you have with your health care provider. Document Released: 08/08/2009 Document Revised: 03/19/2016 Document Reviewed: 11/16/2014 Elsevier Interactive Patient Education  [employer].

## 2017-08-25 ENCOUNTER — Other Ambulatory Visit: Payer: Self-pay | Admitting: Internal Medicine

## 2017-08-25 DIAGNOSIS — E039 Hypothyroidism, unspecified: Secondary | ICD-10-CM

## 2017-09-02 ENCOUNTER — Other Ambulatory Visit: Payer: Self-pay | Admitting: Internal Medicine

## 2017-09-10 ENCOUNTER — Other Ambulatory Visit: Payer: Self-pay | Admitting: Internal Medicine

## 2017-10-25 ENCOUNTER — Other Ambulatory Visit: Payer: Self-pay | Admitting: Internal Medicine

## 2017-12-13 ENCOUNTER — Encounter: Payer: Self-pay | Admitting: Internal Medicine

## 2017-12-13 ENCOUNTER — Ambulatory Visit (INDEPENDENT_AMBULATORY_CARE_PROVIDER_SITE_OTHER): Payer: Medicare Other | Admitting: Internal Medicine

## 2017-12-13 VITALS — BP 128/80 | HR 78 | Temp 97.4°F | Resp 16 | Ht 62.0 in | Wt 114.0 lb

## 2017-12-13 DIAGNOSIS — I10 Essential (primary) hypertension: Secondary | ICD-10-CM | POA: Diagnosis not present

## 2017-12-13 DIAGNOSIS — E118 Type 2 diabetes mellitus with unspecified complications: Secondary | ICD-10-CM | POA: Diagnosis not present

## 2017-12-13 DIAGNOSIS — R05 Cough: Secondary | ICD-10-CM | POA: Diagnosis not present

## 2017-12-13 DIAGNOSIS — E039 Hypothyroidism, unspecified: Secondary | ICD-10-CM

## 2017-12-13 DIAGNOSIS — R059 Cough, unspecified: Secondary | ICD-10-CM

## 2017-12-13 NOTE — Progress Notes (Signed)
Subjective:  Patient ID: Jackie Horton, female    DOB: [birth year]  Age: 84 y.o. MRN: [id number]  CC: Cough; COPD; Hypertension; and Hypothyroidism   HPI Jackie Horton presents for f/up - she complains of mild, intermittent cough productive of white phlegm.  She denies any recent episodes of hemoptysis, chest pain, shortness of breath, or wheezing.  She otherwise feels well and offers no other complaints today.  Outpatient Medications Prior to Visit  Medication Sig Dispense Refill  . amLODipine (NORVASC) 10 MG tablet TAKE ONE TABLET BY MOUTH ONCE DAILY 90 tablet 1  . Ascorbic Acid (VITAMIN C PO) Take 1 tablet by mouth daily.    Marland Kitchen aspirin 81 MG tablet Take 81 mg by mouth daily.    . Cholecalciferol (VITAMIN D PO) Take 1 tablet by mouth daily.    Marland Kitchen ibuprofen (ADVIL,MOTRIN) 200 MG tablet Take 200 mg by mouth every 6 (six) hours as needed.    Marland Kitchen levothyroxine (SYNTHROID, LEVOTHROID) 88 MCG tablet TAKE ONE TABLET DAILY 30 tablet 5  . losartan (COZAAR) 50 MG tablet TAKE ONE TABLET EACH DAY 90 tablet 3  . potassium chloride (K-DUR) 10 MEQ tablet TAKE ONE TABLET EACH DAY 90 tablet 3  . rosuvastatin (CRESTOR) 10 MG tablet Take 1 tablet (10 mg total) by mouth daily. 90 tablet 3  . tamoxifen (NOLVADEX) 20 MG tablet TAKE ONE TABLET EACH DAY 90 tablet 4  . triamterene-hydrochlorothiazide (MAXZIDE-25) 37.5-25 MG tablet Take 0.5 tablets daily by mouth. 135 tablet 1   No facility-administered medications prior to visit.     ROS Review of Systems  Constitutional: Negative for chills, diaphoresis, fatigue and fever.  HENT: Negative.  Negative for sinus pressure, sore throat and trouble swallowing.   Eyes: Negative.   Respiratory: Positive for cough. Negative for choking, chest tightness, shortness of breath and wheezing.   Cardiovascular: Negative for chest pain, palpitations and leg swelling.  Gastrointestinal: Negative for abdominal pain, constipation, diarrhea, nausea and vomiting.    Endocrine: Negative.  Negative for cold intolerance and heat intolerance.  Genitourinary: Negative.  Negative for difficulty urinating, dysuria and urgency.  Musculoskeletal: Negative.  Negative for back pain and myalgias.  Skin: Negative.   Allergic/Immunologic: Negative.   Neurological: Negative.  Negative for dizziness, weakness and light-headedness.  Hematological: Negative for adenopathy. Does not bruise/bleed easily.  Psychiatric/Behavioral: Negative.     Objective:  BP 128/80 (BP Location: Left Arm, Patient Position: Sitting, Cuff Size: Normal)   Pulse 78   Temp (!) 97.4 F (36.3 C) (Oral)   Ht 5\' 2"  (1.575 m)   Wt 114 lb (51.7 kg)   SpO2 99%   BMI 20.85 kg/m   BP Readings from Last 3 Encounters:  12/13/17 128/80  08/17/17 132/72  08/10/17 134/74    Wt Readings from Last 3 Encounters:  12/13/17 114 lb (51.7 kg)  08/17/17 114 lb (51.7 kg)  08/10/17 116 lb 2 oz (52.7 kg)    Physical Exam  Constitutional: She is oriented to person, place, and time. No distress.  HENT:  Mouth/Throat: Oropharynx is clear and moist. No oropharyngeal exudate.  Eyes: Conjunctivae are normal. Left eye exhibits no discharge. No scleral icterus.  Neck: Normal range of motion. Neck supple. No JVD present. No thyromegaly present.  Cardiovascular: Normal rate, regular rhythm and normal heart sounds. Exam reveals no gallop.  No murmur heard. Pulmonary/Chest: Effort normal and breath sounds normal. No respiratory distress. She has no wheezes. She has no rales.  Abdominal: Soft. Bowel  sounds are normal. She exhibits no distension and no mass. There is no tenderness.  Musculoskeletal: Normal range of motion. She exhibits no edema, tenderness or deformity.  Lymphadenopathy:    She has no cervical adenopathy.  Neurological: She is alert and oriented to person, place, and time.  Skin: Skin is warm and dry. No rash noted. She is not diaphoretic. No erythema. No pallor.  Psychiatric: She has a  normal mood and affect. Her behavior is normal. Judgment and thought content normal.  Vitals reviewed.   Lab Results  Component Value Date   WBC 7.0 01/26/2017   HGB 15.3 (H) 01/26/2017   HCT 44.6 01/26/2017   PLT 290.0 01/26/2017   GLUCOSE 94 01/26/2017   CHOL 263 (H) 01/26/2017   TRIG 114.0 01/26/2017   HDL 51.90 01/26/2017   LDLCALC 189 (H) 01/26/2017   ALT 26 01/26/2017   AST 19 01/26/2017   NA 140 01/26/2017   K 4.1 01/26/2017   CL 108 01/26/2017   CREATININE 1.01 01/26/2017   BUN 17 01/26/2017   CO2 27 01/26/2017   TSH 16.83 (H) 01/26/2017   INR 1.0 05/16/2009   HGBA1C 6.0 01/26/2017   MICROALBUR <0.7 12/16/2015    Dg Chest 2 View  Result Date: 12/23/2015 CLINICAL DATA:  Routine physical examination common no complaints, history of hypertension, hyperlipidemia, COPD, and diabetes. Recently discontinued smoking. EXAM: CHEST  2 VIEW COMPARISON:  PA and lateral chest x-ray of June 18, 2014 FINDINGS: The lungs are adequately inflated. The interstitial markings are coarse likely reflecting the patient's smoking history. There is no alveolar infiltrate. There is no pulmonary parenchymal mass or nodule. There is no pleural effusion. The heart and pulmonary vascularity are normal. The mediastinum is normal in width. There is gentle stable S shaped thoracolumbar scoliosis. IMPRESSION: COPD.  No acute cardiopulmonary abnormality. Electronically Signed   By: David  Martinique M.D.   On: 12/23/2015 10:31    Assessment & Plan:   Jackie Horton was seen today for cough, copd, hypertension and hypothyroidism.  Diagnoses and all orders for this visit:  Essential hypertension- Her blood pressure is well controlled.  I will monitor her electrolytes and renal function. -     Comprehensive metabolic panel; Future -     CBC with Differential/Platelet; Future -     Urinalysis, Routine w reflex microscopic; Future  Acquired hypothyroidism- I will check her TSH and will adjust her levothyroxine  dose if indicated. -     Thyroid Panel With TSH; Future  Type 2 diabetes mellitus with complication, without long-term current use of insulin ([place])- I will monitor her A1c and will treat if indicated. -     Hemoglobin A1c; Future -     Microalbumin / creatinine urine ratio; Future  Hypercalcemia- [name].  Will also check a chest x-ray to be certain that she does not have a lung malignancy. -     Comprehensive metabolic panel; Future -     DG Chest 2 View; Future  Cough- I will check a chest x-ray to screen for mass, infiltrate, emphysema. -     DG Chest 2 View; Future   I am having Iliany I. Hun maintain her aspirin, tamoxifen, rosuvastatin, amLODipine, Ascorbic Acid (VITAMIN C PO), Cholecalciferol (VITAMIN D PO), ibuprofen, levothyroxine, losartan, triamterene-hydrochlorothiazide, and potassium chloride.  No orders of the defined types were placed in this encounter.    Follow-up: Return in about 3 months (around 03/12/2018).  Scarlette Calico, MD

## 2017-12-13 NOTE — Patient Instructions (Signed)
Cough, Adult  Coughing is a reflex that clears your throat and your airways. Coughing helps to heal and protect your lungs. It is normal to cough occasionally, but a cough that happens with other symptoms or lasts a long time may be a sign of a condition that needs treatment. A cough may last only 2-3 weeks (acute), or it may last longer than 8 weeks (chronic).  What are the causes?  Coughing is commonly caused by:   Breathing in substances that irritate your lungs.   A viral or bacterial respiratory infection.   Allergies.   Asthma.   Postnasal drip.   Smoking.   Acid backing up from the stomach into the esophagus (gastroesophageal reflux).   Certain medicines.   Chronic lung problems, including COPD (or rarely, lung cancer).   Other medical conditions such as heart failure.    Follow these instructions at home:  Pay attention to any changes in your symptoms. Take these actions to help with your discomfort:   Take medicines only as told by your health care provider.  ? If you were prescribed an antibiotic medicine, take it as told by your health care provider. Do not stop taking the antibiotic even if you start to feel better.  ? Talk with your health care provider before you take a cough suppressant medicine.   Drink enough fluid to keep your urine clear or pale yellow.   If the air is dry, use a cold steam vaporizer or humidifier in your bedroom or your home to help loosen secretions.   Avoid anything that causes you to cough at work or at home.   If your cough is worse at night, try sleeping in a semi-upright position.   Avoid cigarette smoke. If you smoke, quit smoking. If you need help quitting, ask your health care provider.   Avoid caffeine.   Avoid alcohol.   Rest as needed.    Contact a health care provider if:   You have new symptoms.   You cough up pus.   Your cough does not get better after 2-3 weeks, or your cough gets worse.   You cannot control your cough with suppressant  medicines and you are losing sleep.   You develop pain that is getting worse or pain that is not controlled with pain medicines.   You have a fever.   You have unexplained weight loss.   You have night sweats.  Get help right away if:   You cough up blood.   You have difficulty breathing.   Your heartbeat is very fast.  This information is not intended to replace advice given to you by your health care provider. Make sure you discuss any questions you have with your health care provider.  Document Released: 04/10/2011 Document Revised: 03/19/2016 Document Reviewed: 12/19/2014  Elsevier Interactive Patient Education  2018 Elsevier Inc[employer]

## 2017-12-14 ENCOUNTER — Encounter: Payer: Self-pay | Admitting: Internal Medicine

## 2018-02-01 DIAGNOSIS — Z803 Family history of malignant neoplasm of breast: Secondary | ICD-10-CM | POA: Diagnosis not present

## 2018-02-01 DIAGNOSIS — Z853 Personal history of malignant neoplasm of breast: Secondary | ICD-10-CM | POA: Diagnosis not present

## 2018-02-01 DIAGNOSIS — Z1231 Encounter for screening mammogram for malignant neoplasm of breast: Secondary | ICD-10-CM | POA: Diagnosis not present

## 2018-05-16 ENCOUNTER — Other Ambulatory Visit: Payer: Self-pay | Admitting: Internal Medicine

## 2018-05-16 DIAGNOSIS — E7800 Pure hypercholesterolemia, unspecified: Secondary | ICD-10-CM

## 2018-05-31 ENCOUNTER — Other Ambulatory Visit: Payer: Self-pay | Admitting: Internal Medicine

## 2018-05-31 DIAGNOSIS — E039 Hypothyroidism, unspecified: Secondary | ICD-10-CM

## 2018-06-13 ENCOUNTER — Encounter: Payer: Self-pay | Admitting: Internal Medicine

## 2018-06-13 ENCOUNTER — Ambulatory Visit (INDEPENDENT_AMBULATORY_CARE_PROVIDER_SITE_OTHER): Payer: Medicare Other | Admitting: Internal Medicine

## 2018-06-13 ENCOUNTER — Other Ambulatory Visit (INDEPENDENT_AMBULATORY_CARE_PROVIDER_SITE_OTHER): Payer: Medicare Other

## 2018-06-13 VITALS — BP 140/80 | HR 98 | Temp 97.8°F | Resp 16 | Ht 62.0 in | Wt 117.5 lb

## 2018-06-13 DIAGNOSIS — I10 Essential (primary) hypertension: Secondary | ICD-10-CM

## 2018-06-13 DIAGNOSIS — E118 Type 2 diabetes mellitus with unspecified complications: Secondary | ICD-10-CM | POA: Diagnosis not present

## 2018-06-13 DIAGNOSIS — E039 Hypothyroidism, unspecified: Secondary | ICD-10-CM | POA: Diagnosis not present

## 2018-06-13 DIAGNOSIS — E871 Hypo-osmolality and hyponatremia: Secondary | ICD-10-CM | POA: Insufficient documentation

## 2018-06-13 LAB — HEMOGLOBIN A1C: Hgb A1c MFr Bld: 6.3 % (ref 4.6–6.5)

## 2018-06-13 LAB — POCT UA - MICROALBUMIN
Albumin/Creatinine Ratio, Urine, POC: <30
CREATININE, POC: 50 mg/dL
MICROALBUMIN (UR) POC: 10 mg/L

## 2018-06-13 LAB — COMPREHENSIVE METABOLIC PANEL
ALK PHOS: 111 U/L (ref 39–117)
ALT: 25 U/L (ref 0–35)
AST: 18 U/L (ref 0–37)
Albumin: 4.6 g/dL (ref 3.5–5.2)
BILIRUBIN TOTAL: 0.7 mg/dL (ref 0.2–1.2)
BUN: 14 mg/dL (ref 6–23)
CO2: 27 mEq/L (ref 19–32)
CREATININE: 0.87 mg/dL (ref 0.40–1.20)
Calcium: 10.8 mg/dL — ABNORMAL HIGH (ref 8.4–10.5)
Chloride: 101 mEq/L (ref 96–112)
GFR: 79.98 mL/min (ref >60.00)
GLUCOSE: 111 mg/dL — AB (ref 70–99)
POTASSIUM: 3.9 meq/L (ref 3.5–5.1)
SODIUM: 135 meq/L (ref 135–145)
TOTAL PROTEIN: 7.8 g/dL (ref 6.0–8.3)

## 2018-06-13 LAB — CBC WITH DIFFERENTIAL/PLATELET
Basophils Absolute: 0.1 10*3/uL (ref 0.0–0.1)
Basophils Relative: 0.6 % (ref 0.0–3.0)
EOS ABS: 0.1 10*3/uL (ref 0.0–0.7)
EOS PCT: 0.5 % (ref 0.0–5.0)
HCT: 46.9 % — ABNORMAL HIGH (ref 36.0–46.0)
Hemoglobin: 15.8 g/dL — ABNORMAL HIGH (ref 12.0–15.0)
LYMPHS ABS: 3.6 10*3/uL (ref 0.7–4.0)
Lymphocytes Relative: 36.1 % (ref 12.0–46.0)
MCHC: 33.6 g/dL (ref 30.0–36.0)
MCV: 91.1 fl (ref 78.0–100.0)
MONO ABS: 0.8 10*3/uL (ref 0.1–1.0)
Monocytes Relative: 7.9 % (ref 3.0–12.0)
Neutro Abs: 5.5 10*3/uL (ref 1.4–7.7)
Neutrophils Relative %: 54.9 % (ref 43.0–77.0)
Platelets: 309 10*3/uL (ref 150.0–400.0)
RBC: 5.15 Mil/uL — AB (ref 3.87–5.11)
RDW: 13.4 % (ref 11.5–15.5)
WBC: 10 10*3/uL (ref 4.0–10.5)

## 2018-06-13 LAB — THYROID PANEL WITH TSH
Free Thyroxine Index: 4.6 — ABNORMAL HIGH (ref 1.4–3.8)
T3 Uptake: 30 % (ref 22–35)
T4, Total: 15.2 ug/dL — ABNORMAL HIGH (ref 5.1–11.9)
TSH: 0.04 m[IU]/L — AB (ref 0.40–4.50)

## 2018-06-13 NOTE — Progress Notes (Signed)
Subjective:  Patient ID: Jackie Horton, female    DOB: 02-18-1933  Age: 84 y.o. MRN: [id number]  CC: Hypertension and Hypothyroidism   HPI REATHER STELLER presents for f/up - she feels well, offers no complaints.  Outpatient Medications Prior to Visit  Medication Sig Dispense Refill  . amLODipine (NORVASC) 10 MG tablet TAKE ONE TABLET BY MOUTH ONCE DAILY 90 tablet 1  . Ascorbic Acid (VITAMIN C PO) Take 1 tablet by mouth daily.    . Cholecalciferol (VITAMIN D PO) Take 1 tablet by mouth daily.    Marland Kitchen losartan (COZAAR) 50 MG tablet TAKE ONE TABLET EACH DAY 90 tablet 3  . potassium chloride (K-DUR) 10 MEQ tablet TAKE ONE TABLET EACH DAY 90 tablet 3  . rosuvastatin (CRESTOR) 10 MG tablet TAKE ONE TABLET BY MOUTH ONCE DAILY 90 tablet 1  . tamoxifen (NOLVADEX) 20 MG tablet TAKE ONE TABLET EACH DAY 90 tablet 4  . triamterene-hydrochlorothiazide (MAXZIDE-25) 37.5-25 MG tablet Take 0.5 tablets daily by mouth. 135 tablet 1  . aspirin 81 MG tablet Take 81 mg by mouth daily.    Marland Kitchen ibuprofen (ADVIL,MOTRIN) 200 MG tablet Take 200 mg by mouth every 6 (six) hours as needed.    Marland Kitchen levothyroxine (SYNTHROID, LEVOTHROID) 88 MCG tablet TAKE ONE TABLET DAILY 90 tablet 0   No facility-administered medications prior to visit.     ROS Review of Systems  Constitutional: Negative for appetite change, diaphoresis, fatigue and unexpected weight change.  HENT: Negative.   Eyes: Negative for visual disturbance.  Respiratory: Negative.  Negative for cough, chest tightness, shortness of breath and wheezing.   Cardiovascular: Negative for chest pain, palpitations and leg swelling.  Gastrointestinal: Negative for abdominal pain, constipation, diarrhea, nausea and vomiting.  Endocrine: Negative for cold intolerance and heat intolerance.  Genitourinary: Negative.  Negative for dysuria.  Musculoskeletal: Negative.  Negative for myalgias.  Skin: Negative.   Neurological: Negative.  Negative for weakness,  light-headedness and headaches.  Hematological: Negative for adenopathy. Does not bruise/bleed easily.  Psychiatric/Behavioral: Negative.     Objective:  BP 140/80 (BP Location: Left Arm, Patient Position: Sitting, Cuff Size: Normal)   Pulse 98   Temp 97.8 F (36.6 C) (Oral)   Resp 16   Ht 5\' 2"  (1.575 m)   Wt 117 lb 8 oz (53.3 kg)   SpO2 90%   BMI 21.49 kg/m   BP Readings from Last 3 Encounters:  06/13/18 140/80  12/13/17 128/80  08/17/17 132/72    Wt Readings from Last 3 Encounters:  06/13/18 117 lb 8 oz (53.3 kg)  12/13/17 114 lb (51.7 kg)  08/17/17 114 lb (51.7 kg)    Physical Exam  Constitutional: She is oriented to person, place, and time. No distress.  HENT:  Mouth/Throat: Oropharynx is clear and moist. No oropharyngeal exudate.  Eyes: Conjunctivae are normal. No scleral icterus.  Neck: Normal range of motion. Neck supple. No JVD present. No thyromegaly present.  Cardiovascular: Normal rate, regular rhythm and normal heart sounds. Exam reveals no gallop and no friction rub.  No murmur heard. Pulmonary/Chest: Effort normal and breath sounds normal. No respiratory distress. She has no wheezes. She has no rales.  Abdominal: Soft. Bowel sounds are normal. She exhibits no mass. There is no hepatosplenomegaly. There is no tenderness.  Musculoskeletal: Normal range of motion. She exhibits no edema, tenderness or deformity.  Lymphadenopathy:    She has no cervical adenopathy.  Neurological: She is alert and oriented to person, place, and time.  Skin: Skin is warm and dry. She is not diaphoretic. No pallor.  Psychiatric: She has a normal mood and affect. Her behavior is normal. Judgment and thought content normal.  Vitals reviewed.   Lab Results  Component Value Date   WBC 10.0 06/13/2018   HGB 15.8 (H) 06/13/2018   HCT 46.9 (H) 06/13/2018   PLT 309.0 06/13/2018   GLUCOSE 111 (H) 06/13/2018   CHOL 263 (H) 01/26/2017   TRIG 114.0 01/26/2017   HDL 51.90 01/26/2017    LDLCALC 189 (H) 01/26/2017   ALT 25 06/13/2018   AST 18 06/13/2018   NA 135 06/13/2018   K 3.9 06/13/2018   CL 101 06/13/2018   CREATININE 0.87 06/13/2018   BUN 14 06/13/2018   CO2 27 06/13/2018   TSH 0.04 (L) 06/13/2018   INR 1.0 05/16/2009   HGBA1C 6.3 06/13/2018   MICROALBUR 10 06/13/2018    Dg Chest 2 View  Result Date: 12/23/2015 CLINICAL DATA:  Routine physical examination common no complaints, history of hypertension, hyperlipidemia, COPD, and diabetes. Recently discontinued smoking. EXAM: CHEST  2 VIEW COMPARISON:  PA and lateral chest x-ray of June 18, 2014 FINDINGS: The lungs are adequately inflated. The interstitial markings are coarse likely reflecting the patient's smoking history. There is no alveolar infiltrate. There is no pulmonary parenchymal mass or nodule. There is no pleural effusion. The heart and pulmonary vascularity are normal. The mediastinum is normal in width. There is gentle stable S shaped thoracolumbar scoliosis. IMPRESSION: COPD.  No acute cardiopulmonary abnormality. Electronically Signed   By: David  Martinique M.D.   On: 12/23/2015 10:31    Assessment & Plan:   Javona was seen today for hypertension and hypothyroidism.  Diagnoses and all orders for this visit:  Essential hypertension- Her blood pressure is well controlled.  Electrolytes and renal function are normal.  Acquired hypothyroidism- Her TSH is suppressed down to 0.04.  I have asked her to decrease her levothyroxine dose to 50 mcg a day. -     levothyroxine (SYNTHROID, LEVOTHROID) 50 MCG tablet; Take 1 tablet (50 mcg total) by mouth daily.  Type 2 diabetes mellitus with complication, without long-term current use of insulin ([place])- Her A1c is at 6.3%.  Her blood sugars are adequately well controlled. -     POCT UA - Microalbumin -     HM Diabetes Foot Exam  Hyponatremia -     Cancel: Cortisol; Future  Hypercalcemia- She has a mild, stable elevation in her calcium level.  She is  asymptomatic with respect to this.  Will continue to monitor this in the future.   I have discontinued Laurinda I. Kilcrease's aspirin, ibuprofen, and levothyroxine. I am also having her start on levothyroxine. Additionally, I am having her maintain her tamoxifen, amLODipine, Ascorbic Acid (VITAMIN C PO), Cholecalciferol (VITAMIN D PO), losartan, triamterene-hydrochlorothiazide, potassium chloride, and rosuvastatin.  Meds ordered this encounter  Medications  . levothyroxine (SYNTHROID, LEVOTHROID) 50 MCG tablet    Sig: Take 1 tablet (50 mcg total) by mouth daily.    Dispense:  90 tablet    Refill:  0     Follow-up: No follow-ups on file.  Scarlette Calico, MD

## 2018-06-14 ENCOUNTER — Encounter: Payer: Self-pay | Admitting: Internal Medicine

## 2018-06-14 MED ORDER — LEVOTHYROXINE SODIUM 50 MCG PO TABS: 50.0000 ug | ORAL_TABLET | Freq: Every day | ORAL | 0 refills | Status: DC

## 2018-06-14 NOTE — Patient Instructions (Signed)
Hypothyroidism Hypothyroidism is a disorder of the thyroid. The thyroid is a large gland that is located in the lower front of the neck. The thyroid releases hormones that control how the body works. With hypothyroidism, the thyroid does not make enough of these hormones. What are the causes? Causes of hypothyroidism may include:  Viral infections.  Pregnancy.  Your own defense system (immune system) attacking your thyroid.  Certain medicines.  Birth defects.  Past radiation treatments to your head or neck.  Past treatment with radioactive iodine.  Past surgical removal of part or all of your thyroid.  Problems with the gland that is located in the center of your brain (pituitary).  What are the signs or symptoms? Signs and symptoms of hypothyroidism may include:  Feeling as though you have no energy (lethargy).  Inability to tolerate cold.  Weight gain that is not explained by a change in diet or exercise habits.  Dry skin.  Coarse hair.  Menstrual irregularity.  Slowing of thought processes.  Constipation.  Sadness or depression.  How is this diagnosed? Your health care provider may diagnose hypothyroidism with blood tests and ultrasound tests. How is this treated? Hypothyroidism is treated with medicine that replaces the hormones that your body does not make. After you begin treatment, it may take several weeks for symptoms to go away. Follow these instructions at home:  Take medicines only as directed by your health care provider.  If you start taking any new medicines, tell your health care provider.  Keep all follow-up visits as directed by your health care provider. This is important. As your condition improves, your dosage needs may change. You will need to have blood tests regularly so that your health care provider can watch your condition. Contact a health care provider if:  Your symptoms do not get better with treatment.  You are taking thyroid  replacement medicine and: ? You sweat excessively. ? You have tremors. ? You feel anxious. ? You lose weight rapidly. ? You cannot tolerate heat. ? You have emotional swings. ? You have diarrhea. ? You feel weak. Get help right away if:  You develop chest pain.  You develop an irregular heartbeat.  You develop a rapid heartbeat. This information is not intended to replace advice given to you by your health care provider. Make sure you discuss any questions you have with your health care provider. Document Released: 10/12/2005 Document Revised: 03/19/2016 Document Reviewed: 02/27/2014 Elsevier Interactive Patient Education  2018 [employer] Inc.  

## 2018-06-15 ENCOUNTER — Telehealth: Payer: Self-pay | Admitting: Internal Medicine

## 2018-06-15 NOTE — Telephone Encounter (Signed)
Copied from [place] [id number]. Topic: Quick Communication - See Telephone Encounter >> Jun 15, 2018 11:11 AM Neva Seat wrote: Pt needing a call back regarding taking a lower dosage of the medication -rosuvastatin (CRESTOR) 10 MG tablet.

## 2018-06-16 ENCOUNTER — Other Ambulatory Visit: Payer: Self-pay | Admitting: Internal Medicine

## 2018-06-16 NOTE — Telephone Encounter (Signed)
We did not have this discussion I don't think she needs a higher statin dose

## 2018-06-16 NOTE — Telephone Encounter (Signed)
Pt states that her crestor dose was going to be increased. She stated that the conversation took place at her OV. Please advise.

## 2018-06-17 NOTE — Telephone Encounter (Signed)
Pt informed per PCP to keep the current dose as is. Pt stated understanding.

## 2018-06-17 NOTE — Telephone Encounter (Signed)
Pt is calling back checking on the status of chole med increase

## 2018-08-31 ENCOUNTER — Other Ambulatory Visit: Payer: Self-pay | Admitting: Internal Medicine

## 2018-08-31 DIAGNOSIS — I10 Essential (primary) hypertension: Secondary | ICD-10-CM

## 2018-08-31 DIAGNOSIS — E039 Hypothyroidism, unspecified: Secondary | ICD-10-CM

## 2018-08-31 MED ORDER — LEVOTHYROXINE SODIUM 50 MCG PO TABS: 50.0000 ug | ORAL_TABLET | Freq: Every day | ORAL | 0 refills | Status: DC

## 2018-08-31 MED ORDER — LOSARTAN POTASSIUM 50 MG PO TABS: ORAL_TABLET | ORAL | 1 refills | Status: DC

## 2018-10-20 ENCOUNTER — Other Ambulatory Visit: Payer: Self-pay | Admitting: Internal Medicine

## 2018-11-30 ENCOUNTER — Ambulatory Visit (INDEPENDENT_AMBULATORY_CARE_PROVIDER_SITE_OTHER): Payer: Medicare Other | Admitting: Family

## 2018-11-30 ENCOUNTER — Ambulatory Visit (INDEPENDENT_AMBULATORY_CARE_PROVIDER_SITE_OTHER)
Admission: RE | Admit: 2018-11-30 | Discharge: 2018-11-30 | Disposition: A | Discharge status: Home / Self Care | Payer: Medicare Other | Source: Ambulatory Visit | Attending: Family | Admitting: Family

## 2018-11-30 ENCOUNTER — Ambulatory Visit: Payer: Self-pay | Admitting: *Deleted

## 2018-11-30 ENCOUNTER — Encounter: Payer: Self-pay | Admitting: Family

## 2018-11-30 VITALS — BP 150/82 | HR 111 | Temp 97.7°F | Ht 62.0 in

## 2018-11-30 DIAGNOSIS — M25552 Pain in left hip: Secondary | ICD-10-CM

## 2018-11-30 DIAGNOSIS — S2241XA Multiple fractures of ribs, right side, initial encounter for closed fracture: Secondary | ICD-10-CM | POA: Diagnosis not present

## 2018-11-30 DIAGNOSIS — R079 Chest pain, unspecified: Secondary | ICD-10-CM

## 2018-11-30 DIAGNOSIS — R0781 Pleurodynia: Secondary | ICD-10-CM

## 2018-11-30 DIAGNOSIS — M25551 Pain in right hip: Secondary | ICD-10-CM | POA: Diagnosis not present

## 2018-11-30 DIAGNOSIS — S79911A Unspecified injury of right hip, initial encounter: Secondary | ICD-10-CM | POA: Diagnosis not present

## 2018-11-30 DIAGNOSIS — S79912A Unspecified injury of left hip, initial encounter: Secondary | ICD-10-CM | POA: Diagnosis not present

## 2018-11-30 MED ORDER — TRAMADOL HCL 50 MG PO TABS: 50.0000 mg | ORAL_TABLET | Freq: Three times a day (TID) | ORAL | 0 refills | Status: DC | PRN

## 2018-11-30 NOTE — Progress Notes (Signed)
Jackie Horton is a 85 y.o. female with the following history as recorded in Epic[facility]:  Patient Active Problem List   Diagnosis Date Noted  . Multiple closed fractures of ribs of right side 12/01/2018  . Depression with somatization 07/22/2016  . Hypercalcemia 06/11/2015  . Type II diabetes mellitus with manifestations ([place]) 02/21/2014  . Routine general medical examination at a health care facility 02/21/2014  . COPD with chronic bronchitis ([place]) 11/17/2013  . DJD (degenerative joint disease) 05/15/2011  . Osteopenia 05/15/2011  . Constipation 02/01/2011  . Hypertension 01/28/2011  . Hypercholesteremia 01/28/2011  . Breast cancer ([place]) 01/28/2011  . Hypothyroid 01/28/2011  . Cigarette smoker 01/28/2011    Current Outpatient Medications  Medication Sig Dispense Refill  . amLODipine (NORVASC) 10 MG tablet TAKE ONE TABLET BY MOUTH ONCE DAILY 90 tablet 1  . Ascorbic Acid (VITAMIN C PO) Take 1 tablet by mouth daily.    . Cholecalciferol (VITAMIN D PO) Take 1 tablet by mouth daily.    Marland Kitchen levothyroxine (SYNTHROID, LEVOTHROID) 50 MCG tablet Take 1 tablet (50 mcg total) by mouth daily. 90 tablet 0  . losartan (COZAAR) 50 MG tablet TAKE ONE TABLET EACH DAY 90 tablet 1  . potassium chloride (K-DUR) 10 MEQ tablet TAKE ONE TABLET EACH DAY 90 tablet 0  . rosuvastatin (CRESTOR) 10 MG tablet TAKE ONE TABLET BY MOUTH ONCE DAILY 90 tablet 1  . tamoxifen (NOLVADEX) 20 MG tablet TAKE ONE TABLET EACH DAY 90 tablet 4  . traMADol (ULTRAM) 50 MG tablet Take 1 tablet (50 mg total) by mouth every 8 (eight) hours as needed. 20 tablet 0  . triamterene-hydrochlorothiazide (MAXZIDE-25) 37.5-25 MG tablet Take 0.5 tablets daily by mouth. 135 tablet 1   No current facility-administered medications for this visit.     Allergies: Patient has no known allergies.  Past Medical History:  Diagnosis Date  . Breast cancer ([place])   . Cigarette smoker   . Constipation   . Hypercholesteremia   . Hypertension   .  Hypothyroid   . Osteoarthritis     Past Surgical History:  Procedure Laterality Date  . bladder tact  2011  . BREAST LUMPECTOMY  2009  . PARTIAL HYSTERECTOMY  1972  . ROTATOR CUFF REPAIR      Family History  Problem Relation Age of Onset  . Breast cancer Daughter   . Diabetes Daughter   . Breast cancer Sister        3 sisters  . Aneurysm Father     Social History   Tobacco Use  . Smoking status: Current Some Day Smoker    Packs/day: 0.25    Types: Cigarettes    Last attempt to quit: 10/14/2015    Years since quitting: 3.1  . Smokeless tobacco: Never Used  Substance Use Topics  . Alcohol use: No    Alcohol/week: 0.0 standard drinks    Subjective:  Patient is brought to the office with her family; she slipped at home on Sunday- landed on her right side ( elbow/ back area); today complaining of pain in her right rib area- no shortness of breath, difficulty breathing or hemoptysis; having increased pain bearing weight in the past few days- right worse than left;  Objective:  Vitals:   11/30/18 1339  BP: (!) 150/82  Pulse: (!) 111  Temp: 97.7 F (36.5 C)  TempSrc: Oral  SpO2: 98%  Height: 5\' 2"  (1.575 m)    General: Well developed, well nourished, in no acute distress  Skin : Warm and dry.  Head: Normocephalic and atraumatic  Lungs: Respirations unlabored; clear to auscultation bilaterally without wheeze, rales, rhonchi  CVS exam: normal rate and regular rhythm.  Musculoskeletal: No deformities; no active joint inflammation  Extremities: No edema, cyanosis, clubbing  Vessels: Symmetric bilaterally  Neurologic: Alert and oriented; speech intact; face symmetrical; prefers not bear weight today; CNII-XII intact without focal deficit  Assessment:  1. Pain of both hip joints   2. Rib pain on right side   3. Chest pain, unspecified type     Plan:  1. Update both hip x-rays- no acute fracture noted/ chronic changes; refer to sports medicine; scheduled to see Dr.  Raeford Razor on Thursday, December 01, 2018. 2. Update rib x-ray- does show fracture; Rx for Tramadol use as directed; 3. No acute pneumothorax or pneumonia seen;    No follow-ups on file.  Orders Placed This Encounter  Procedures  . DG HIPS BILAT WITH PELVIS 2V    Standing Status:   Future    Number of Occurrences:   1    Standing Expiration Date:   01/29/2020    Order Specific Question:   Reason for Exam (SYMPTOM  OR DIAGNOSIS REQUIRED)    Answer:   s/p fall; painful to bear weight; right hip is more problematic than left hip    Order Specific Question:   Preferred imaging location?    Answer:   [facility]    Order Specific Question:   Radiology Contrast Protocol - do NOT remove file path    Answer:   \\[email or web address]s.pdf  . DG Ribs Unilateral Right    Standing Status:   Future    Number of Occurrences:   1    Standing Expiration Date:   01/29/2020    Order Specific Question:   Reason for Exam (SYMPTOM  OR DIAGNOSIS REQUIRED)    Answer:   right rib pain; s/p fall    Order Specific Question:   Preferred imaging location?    Answer:   [facility]    Order Specific Question:   Radiology Contrast Protocol - do NOT remove file path    Answer:   \\[email or web address]s.pdf  . DG Chest 2 View    Standing Status:   Future    Standing Expiration Date:   01/29/2020    Order Specific Question:   Reason for Exam (SYMPTOM  OR DIAGNOSIS REQUIRED)    Answer:   chest pain s/p fall    Order Specific Question:   Preferred imaging location?    Answer:   [facility]    Order Specific Question:   Radiology Contrast Protocol - do NOT remove file path    Answer:   \\[email or web address]s.[email or web address]    Requested Prescriptions   Signed Prescriptions Disp Refills  . traMADol (ULTRAM) 50 MG tablet 20 tablet 0    Sig: Take 1 tablet (50 mg total) by mouth every 8 (eight) hours as needed.

## 2018-11-30 NOTE — Progress Notes (Signed)
Reviewed with patient and family; will treat with Tramadol; refer to sports medicine on 12/01/2018

## 2018-11-30 NOTE — Telephone Encounter (Signed)
Pt called with bil hip pain and difficulty walking after a fall on 11/28/2018; she states that she was walking and fell; the pt is concerned that she may have a hip fracture; the pt refuses to go to ED and would like to "see her doctor"; nurse triage initiated and recommendations made per protocol; she normally sees Dr Scarlette Calico but he is not availalble; pt offered and accepted appointment with Jodi Mourning, [place], 11/30/2018 at 1340; she and her son verbalize understanding; will route to office for notification; also spoke with Sam   Reason for Disposition . [1] High-risk adult (e.g., age > 32, osteoporosis, chronic steroid use) AND [2] limping  Answer Assessment - Initial Assessment Questions 1. LOCATION and RADIATION: "Where is the pain located?"      Right and left hip  2. QUALITY: "What does the pain feel like?"  (e.g., sharp, dull, aching, burning)     Aches when walking 3. SEVERITY: "How bad is the pain?" "What does it keep you from doing?"   (Scale 1-10; or mild, moderate, severe)   -  MILD (1-3): doesn't interfere with normal activities    -  MODERATE (4-7): interferes with normal activities (e.g., work or school) or awakens from sleep, limping    -  SEVERE (8-10): excruciating pain, unable to do any normal activities, unable to walk     Rated 8 out of 10 4. ONSET: "When did the pain start?" "Does it come and go, or is it there all the time?"     11/27/2018; when the pt gets up to walk; pt's son has to help her walk 5. WORK OR EXERCISE: "Has there been any recent work or exercise that involved this part of the body?"      no 6. CAUSE: "What do you think is causing the hip pain?"      Pt fell on 11/27/2018 7. AGGRAVATING FACTORS: "What makes the hip pain worse?" (e.g., walking, climbing stairs, running)     walking 8. OTHER SYMPTOMS: "Do you have any other symptoms?" (e.g., back pain, pain shooting down leg,  fever, rash)     no  Answer Assessment - Initial Assessment Questions 1.  MECHANISM: "How did the injury happen?" (e.g., twisting injury, direct blow)      Pt fell on 11/27/2018 2. ONSET: "When did the injury happen?" (Minutes or hours ago)      Days 12/15/2018 3. LOCATION: "Where is the injury located?"     bilateral hips 4. APPEARANCE of INJURY: "What does the injury look like?"  (e.g., deformity of leg)     Hips look normal 5. SEVERITY: "Can you put weight on that leg?" "Can you walk?"      Pt's son has to help her walk 6. SIZE: For cuts, bruises, or swelling, ask: "How large is it?" (e.g., inches or centimeters;  entire joint)      no 7. PAIN: "Is there pain?" If so, ask: "How bad is the pain?"  (e.g., Scale 1-10; or mild, moderate, severe)     Pain rated 8 out of 10 8. TETANUS: For any breaks in the skin, ask: "When was the last tetanus booster?"     no 9. OTHER SYMPTOMS: "Do you have any other symptoms?"      n/a 10. PREGNANCY: "Is there any chance you are pregnant?" "When was your last menstrual period?"       no  Protocols used: HIP INJURY-A-AH, HIP PAIN-A-AH

## 2018-11-30 NOTE — Progress Notes (Signed)
?   Old fracture; no acute fracture; will refer to sports medicine for second opinion.

## 2018-12-01 ENCOUNTER — Encounter: Payer: Self-pay | Admitting: Family Medicine

## 2018-12-01 ENCOUNTER — Ambulatory Visit (INDEPENDENT_AMBULATORY_CARE_PROVIDER_SITE_OTHER): Payer: Medicare Other | Admitting: Family Medicine

## 2018-12-01 VITALS — BP 138/80 | HR 98 | Temp 97.4°F | Ht 62.0 in

## 2018-12-01 DIAGNOSIS — S2241XA Multiple fractures of ribs, right side, initial encounter for closed fracture: Secondary | ICD-10-CM | POA: Diagnosis not present

## 2018-12-01 NOTE — Progress Notes (Signed)
Jackie Horton - 85 y.o. female MRN [id number]  Date of birth: 1933-04-30  SUBJECTIVE:  Including CC & ROS.  Chief Complaint  Patient presents with  . Hip Pain    pt here for hip and rip pain. pt fell 5 days ago and was seen at another [facility] yesterday. pt did have xrays on hip and ribs.     TNYA ADES is a 85 y.o. female that is presenting with rib pain on the right side in the mid axillary line.  She fell about 5 or 6 days ago.  Since that time she has been having pain.  Her son is with her and provides history.  He reports that he feels his mother is losing her balance.  She lives at home by herself.  She has trouble ambulating without holding onto something.  She is currently not using a walker.  She is currently in a wheelchair today.  Reports there was a remote history of fracture on pelvic x-ray but most of her pain today is in the ribs.  Denies any shortness of breath.  Does have pain with a deep inspiration.  She has a history of a right total shoulder arthroplasty.  Independent review of the right rib x-ray from 2/5 shows right sixth and seventh rib fractures.   Review of Systems  Constitutional: Negative for fever.  HENT: Negative for congestion.   Respiratory: Negative for cough.   Cardiovascular: Negative for chest pain.  Gastrointestinal: Negative for abdominal pain.  Musculoskeletal: Positive for gait problem.  Skin: Negative for color change.  Neurological: Positive for weakness.  Hematological: Negative for adenopathy.  Psychiatric/Behavioral: Negative for agitation.    HISTORY: Past Medical, Surgical, Social, and Family History Reviewed & Updated per EMR.   Pertinent Historical Findings include:  Past Medical History:  Diagnosis Date  . Breast cancer ([place])   . Cigarette smoker   . Constipation   . Hypercholesteremia   . Hypertension   . Hypothyroid   . Osteoarthritis     Past Surgical History:  Procedure Laterality Date  . bladder tact   2011  . BREAST LUMPECTOMY  2009  . PARTIAL HYSTERECTOMY  1972  . ROTATOR CUFF REPAIR      No Known Allergies  Family History  Problem Relation Age of Onset  . Breast cancer Daughter   . Diabetes Daughter   . Breast cancer Sister        3 sisters  . Aneurysm Father      Social History   Socioeconomic History  . Marital status: Single    Spouse name: Not on file  . Number of children: 3  . Years of education: 52  . Highest education level: Not on file  Occupational History  . Occupation: caregiver    Employer: RETIRED    Comment: works 3 days week at [employer]  . Financial resource strain: Not on file  . Food insecurity:    Worry: Not on file    Inability: Not on file  . Transportation needs:    Medical: Not on file    Non-medical: Not on file  Tobacco Use  . Smoking status: Current Some Day Smoker    Packs/day: 0.25    Types: Cigarettes    Last attempt to quit: 10/14/2015    Years since quitting: 3.1  . Smokeless tobacco: Never Used  Substance and Sexual Activity  . Alcohol use: No    Alcohol/week: 0.0 standard drinks  .  Drug use: No  . Sexual activity: Yes    Birth control/protection: Post-menopausal, Surgical  Lifestyle  . Physical activity:    Days per week: Not on file    Minutes per session: Not on file  . Stress: Not on file  Relationships  . Social connections:    Talks on phone: Not on file    Gets together: Not on file    Attends religious service: Not on file    Active member of club or organization: Not on file    Attends meetings of clubs or organizations: Not on file    Relationship status: Not on file  . Intimate partner violence:    Fear of current or ex partner: Not on file    Emotionally abused: Not on file    Physically abused: Not on file    Forced sexual activity: Not on file  Other Topics Concern  . Not on file  Social History Narrative   2 sisters with breast cancer   Friend--Howard Burch     PHYSICAL  EXAM:  VS: BP 138/80   Pulse 98   Temp (!) 97.4 F (36.3 C) (Oral)   Ht 5\' 2"  (1.575 m)   SpO2 97%   BMI 21.49 kg/m  Physical Exam Gen: NAD, alert, cooperative with exam, well-appearing ENT: normal lips, normal nasal mucosa,  Eye: normal EOM, normal conjunctiva and lids CV:  no edema, +2 pedal pulses   Resp: no accessory muscle use, non-labored,  Skin: no rashes, no areas of induration  Neuro: normal tone, normal sensation to touch Psych:  normal insight, alert and oriented MSK:  Right shoulder: Limited flexion and abduction Limited external rotation. Right ribs: Tenderness to palpation in the mid axillary line. No ecchymosis. No crepitus. No step-offs. Neurovascular intact     ASSESSMENT & PLAN:   Multiple closed fractures of ribs of right side Had a fall a few days ago.  Her son states she he feels that her balance has been worsening.  She has been living alone.  She reports that she tripped on something on the floor. -Provided an Ace wrap for compression. -We will write for a Rollator. -Counseled on the get up and go exercise to strengthen -If no improvement can consider home health physical therapy. -Counseled on taking deep breaths and to follow-up if she develops any signs of infection.

## 2018-12-01 NOTE — Assessment & Plan Note (Signed)
Had a fall a few days ago.  Her son states she he feels that her balance has been worsening.  She has been living alone.  She reports that she tripped on something on the floor. -Provided an Ace wrap for compression. -We will write for a Rollator. -Counseled on the get up and go exercise to strengthen -If no improvement can consider home health physical therapy. -Counseled on taking deep breaths and to follow-up if she develops any signs of infection.

## 2018-12-01 NOTE — Patient Instructions (Signed)
Nice to meet you  Please try the get up and go  Please try the medicine and ice on the ribs.  Please take tylenol for pain  Please see [place] back in 2-3 weeks if the pain isn't improving.

## 2018-12-02 DIAGNOSIS — S2241XA Multiple fractures of ribs, right side, initial encounter for closed fracture: Secondary | ICD-10-CM | POA: Diagnosis not present

## 2018-12-02 DIAGNOSIS — J449 Chronic obstructive pulmonary disease, unspecified: Secondary | ICD-10-CM | POA: Diagnosis not present

## 2018-12-03 ENCOUNTER — Other Ambulatory Visit: Payer: Self-pay | Admitting: Internal Medicine

## 2018-12-03 DIAGNOSIS — E039 Hypothyroidism, unspecified: Secondary | ICD-10-CM

## 2018-12-04 ENCOUNTER — Other Ambulatory Visit: Payer: Self-pay | Admitting: Internal Medicine

## 2018-12-04 DIAGNOSIS — E039 Hypothyroidism, unspecified: Secondary | ICD-10-CM

## 2018-12-04 MED ORDER — LEVOTHYROXINE SODIUM 50 MCG PO TABS: 50.0000 ug | ORAL_TABLET | Freq: Every day | ORAL | 0 refills | Status: DC

## 2018-12-08 ENCOUNTER — Telehealth: Payer: Self-pay | Admitting: Internal Medicine

## 2018-12-08 ENCOUNTER — Encounter: Payer: Self-pay | Admitting: Family

## 2018-12-08 ENCOUNTER — Telehealth: Payer: Self-pay

## 2018-12-08 NOTE — Telephone Encounter (Signed)
Can you see if the note printed? If not, it's in her chart and I think you can print without me?

## 2018-12-08 NOTE — Telephone Encounter (Signed)
Copied from [place] [id number]. Topic: General - Inquiry >> Dec 08, 2018  2:17 PM Vernona Rieger wrote: Reason for CRM: Patient son Rynlee [place], Patient's son is requesting a note for work for when he brought the Patient to see Tamela Oddi on 11/30/18.  He is able to come and pick up the letter when it is ready. Mr. Khurana is requesting the letter by 12/09/18. Please advise [phone]

## 2018-12-08 NOTE — Telephone Encounter (Signed)
Copied from [place] [id number]. Topic: Quick Communication - See Telephone Encounter >> Dec 08, 2018  2:11 PM Blase Mess A wrote: CRM for notification. See Telephone encounter for: 12/08/18.  Patient son Hortensia Duffin, Patient's son is requesting a note for work for when he brought the Patient to see Dr. Shaaron Adler on 12/01/18.  He is able to come and pick up the letter when it is ready. Mr. Quist is requesting the letter by 12/09/18. Please advise [phone]

## 2018-12-13 ENCOUNTER — Ambulatory Visit (INDEPENDENT_AMBULATORY_CARE_PROVIDER_SITE_OTHER): Payer: Medicare Other | Admitting: *Deleted

## 2018-12-13 ENCOUNTER — Ambulatory Visit (INDEPENDENT_AMBULATORY_CARE_PROVIDER_SITE_OTHER)
Admission: RE | Admit: 2018-12-13 | Discharge: 2018-12-13 | Disposition: A | Discharge status: Home / Self Care | Payer: Medicare Other | Source: Ambulatory Visit | Attending: Family | Admitting: Family

## 2018-12-13 ENCOUNTER — Ambulatory Visit (INDEPENDENT_AMBULATORY_CARE_PROVIDER_SITE_OTHER): Payer: Medicare Other | Admitting: Internal Medicine

## 2018-12-13 ENCOUNTER — Encounter: Payer: Self-pay | Admitting: Internal Medicine

## 2018-12-13 VITALS — BP 148/86 | HR 108 | Ht 62.0 in | Wt 113.0 lb

## 2018-12-13 VITALS — BP 148/86 | HR 108 | Temp 97.7°F | Resp 16 | Ht 62.0 in | Wt 113.0 lb

## 2018-12-13 DIAGNOSIS — G3281 Cerebellar ataxia in diseases classified elsewhere: Secondary | ICD-10-CM | POA: Diagnosis not present

## 2018-12-13 DIAGNOSIS — C50811 Malignant neoplasm of overlapping sites of right female breast: Secondary | ICD-10-CM | POA: Diagnosis not present

## 2018-12-13 DIAGNOSIS — E118 Type 2 diabetes mellitus with unspecified complications: Secondary | ICD-10-CM | POA: Diagnosis not present

## 2018-12-13 DIAGNOSIS — E039 Hypothyroidism, unspecified: Secondary | ICD-10-CM

## 2018-12-13 DIAGNOSIS — Z171 Estrogen receptor negative status [ER-]: Secondary | ICD-10-CM

## 2018-12-13 DIAGNOSIS — H903 Sensorineural hearing loss, bilateral: Secondary | ICD-10-CM | POA: Insufficient documentation

## 2018-12-13 DIAGNOSIS — Z0001 Encounter for general adult medical examination with abnormal findings: Secondary | ICD-10-CM | POA: Diagnosis not present

## 2018-12-13 DIAGNOSIS — Z0000 Encounter for general adult medical examination without abnormal findings: Secondary | ICD-10-CM

## 2018-12-13 DIAGNOSIS — I10 Essential (primary) hypertension: Secondary | ICD-10-CM | POA: Diagnosis not present

## 2018-12-13 DIAGNOSIS — E7800 Pure hypercholesterolemia, unspecified: Secondary | ICD-10-CM

## 2018-12-13 DIAGNOSIS — R059 Cough, unspecified: Secondary | ICD-10-CM

## 2018-12-13 DIAGNOSIS — Z23 Encounter for immunization: Secondary | ICD-10-CM | POA: Diagnosis not present

## 2018-12-13 DIAGNOSIS — R079 Chest pain, unspecified: Secondary | ICD-10-CM

## 2018-12-13 DIAGNOSIS — R270 Ataxia, unspecified: Secondary | ICD-10-CM

## 2018-12-13 DIAGNOSIS — R05 Cough: Secondary | ICD-10-CM | POA: Diagnosis not present

## 2018-12-13 DIAGNOSIS — C50812 Malignant neoplasm of overlapping sites of left female breast: Secondary | ICD-10-CM

## 2018-12-13 DIAGNOSIS — R2681 Unsteadiness on feet: Secondary | ICD-10-CM

## 2018-12-13 NOTE — Patient Instructions (Addendum)
Continue doing brain stimulating activities (puzzles, reading, adult coloring books, staying active) to keep memory sharp.   Continue to eat heart healthy diet (full of fruits, vegetables, whole grains, lean protein, water--limit salt, fat, and sugar intake) and increase physical activity as tolerated.   Jackie Horton , Thank you for taking time to come for your Medicare Wellness Visit. I appreciate your ongoing commitment to your health goals. Please review the following plan we discussed and let me know if I can assist you in the future.   These are the goals we discussed: Goals    . Maintain current health status     Stay as healthy and as independent as possible    . Patient Stated     Stay in my home and stay independent as possible.       This is a list of the screening recommended for you and due dates:  Health Maintenance  Topic Date Due  . Eye exam for diabetics  12/13/2018*  . Flu Shot  01/25/2019*  . DEXA scan (bone density measurement)  12/09/2019*  . Hemoglobin A1C  12/14/2018  . Complete foot exam   06/15/2019  . Tetanus Vaccine  02/22/2024  . Pneumonia vaccines  Completed  *Topic was postponed. The date shown is not the original due date.   Influenza Virus Vaccine injection What is this medicine? INFLUENZA VIRUS VACCINE (in floo EN zuh VAHY ruhs vak SEEN) helps to reduce the risk of getting influenza also known as the flu. The vaccine only helps protect you against some strains of the flu. This medicine may be used for other purposes; ask your health care provider or pharmacist if you have questions. COMMON BRAND NAME(S): Afluria, Agriflu, Alfuria, FLUAD, Fluarix, Fluarix Quadrivalent, Flublok, Flublok Quadrivalent, FLUCELVAX, Flulaval, Fluvirin, Fluzone, Fluzone High-Dose, Fluzone Intradermal What should I tell my health care provider before I take this medicine? They need to know if you have any of these conditions: -bleeding disorder like hemophilia -fever or  infection -Guillain-Barre syndrome or other neurological problems -immune system problems -infection with the human immunodeficiency virus (HIV) or AIDS -low blood platelet counts -multiple sclerosis -an unusual or allergic reaction to influenza virus vaccine, latex, other medicines, foods, dyes, or preservatives. Different brands of vaccines contain different allergens. Some may contain latex or eggs. Talk to your doctor about your allergies to make sure that you get the right vaccine. -pregnant or trying to get pregnant -breast-feeding How should I use this medicine? This vaccine is for injection into a muscle or under the skin. It is given by a health care professional. A copy of Vaccine Information Statements will be given before each vaccination. Read this sheet carefully each time. The sheet may change frequently. Talk to your healthcare provider to see which vaccines are right for you. Some vaccines should not be used in all age groups. Overdosage: If you think you have taken too much of this medicine contact a poison control center or emergency room at once. NOTE: This medicine is only for you. Do not share this medicine with others. What if I miss a dose? This does not apply. What may interact with this medicine? -chemotherapy or radiation therapy -medicines that lower your immune system like etanercept, anakinra, infliximab, and adalimumab -medicines that treat or prevent blood clots like warfarin -phenytoin -steroid medicines like prednisone or cortisone -theophylline -vaccines This list may not describe all possible interactions. Give your health care provider a list of all the medicines, herbs, non-prescription drugs, or  dietary supplements you use. Also tell them if you smoke, drink alcohol, or use illegal drugs. Some items may interact with your medicine. What should I watch for while using this medicine? Report any side effects that do not go away within 3 days to your  doctor or health care professional. Call your health care provider if any unusual symptoms occur within 6 weeks of receiving this vaccine. You may still catch the flu, but the illness is not usually as bad. You cannot get the flu from the vaccine. The vaccine will not protect against colds or other illnesses that may cause fever. The vaccine is needed every year. What side effects may I notice from receiving this medicine? Side effects that you should report to your doctor or health care professional as soon as possible: -allergic reactions like skin rash, itching or hives, swelling of the face, lips, or tongue Side effects that usually do not require medical attention (report to your doctor or health care professional if they continue or are bothersome): -fever -headache -muscle aches and pains -pain, tenderness, redness, or swelling at the injection site -tiredness This list may not describe all possible side effects. Call your doctor for medical advice about side effects. You may report side effects to FDA at 1-800-FDA-1088. Where should I keep my medicine? The vaccine will be given by a health care professional in a clinic, pharmacy, doctor's office, or other health care setting. You will not be given vaccine doses to store at home. NOTE: This sheet is a summary. It may not cover all possible information. If you have questions about this medicine, talk to your doctor, pharmacist, or health care provider.  2019 Elsevier/Gold Standard (2015-05-03 10:07:28)

## 2018-12-13 NOTE — Progress Notes (Signed)
Subjective:  Patient ID: Jackie Horton, female    DOB: Mar 26, 1933  Age: 85 y.o. MRN: [id number]  CC: Annual Exam; Hypertension; Hypothyroidism; and Diabetes   HPI JOLEENE BURNHAM presents for a CPX.  She is with her daughter today.  Her daughter has flown in from [facility].  The patient's daughter and son are worried about her.  For the last few months she has had worsening ataxia and has had several falls.  There have been minor injuries in the falls.  Outpatient Medications Prior to Visit  Medication Sig Dispense Refill  . amLODipine (NORVASC) 10 MG tablet TAKE ONE TABLET BY MOUTH ONCE DAILY 90 tablet 1  . Ascorbic Acid (VITAMIN C PO) Take 1 tablet by mouth daily.    . Cholecalciferol (VITAMIN D PO) Take 1 tablet by mouth daily.    Marland Kitchen levothyroxine (SYNTHROID, LEVOTHROID) 50 MCG tablet Take 1 tablet (50 mcg total) by mouth daily. 90 tablet 0  . losartan (COZAAR) 50 MG tablet TAKE ONE TABLET EACH DAY 90 tablet 1  . potassium chloride (K-DUR) 10 MEQ tablet TAKE ONE TABLET EACH DAY 90 tablet 0  . rosuvastatin (CRESTOR) 10 MG tablet TAKE ONE TABLET BY MOUTH ONCE DAILY 90 tablet 1  . tamoxifen (NOLVADEX) 20 MG tablet TAKE ONE TABLET EACH DAY 90 tablet 4  . traMADol (ULTRAM) 50 MG tablet Take 1 tablet (50 mg total) by mouth every 8 (eight) hours as needed. 20 tablet 0  . triamterene-hydrochlorothiazide (MAXZIDE-25) 37.5-25 MG tablet Take 0.5 tablets daily by mouth. 135 tablet 1   No facility-administered medications prior to visit.     ROS Review of Systems  Constitutional: Positive for unexpected weight change (wt loss). Negative for appetite change, chills, diaphoresis and fatigue.  HENT: Positive for hearing loss. Negative for sore throat, trouble swallowing and voice change.   Respiratory: Negative.  Negative for cough, chest tightness, shortness of breath and wheezing.   Cardiovascular: Negative for chest pain, palpitations and leg swelling.  Gastrointestinal:  Negative for abdominal pain, constipation, diarrhea, nausea and vomiting.  Endocrine: Negative for cold intolerance and heat intolerance.  Genitourinary: Negative.  Negative for difficulty urinating.  Musculoskeletal: Positive for gait problem. Negative for arthralgias, back pain, myalgias and neck pain.  Skin: Negative.  Negative for color change, pallor and rash.  Neurological: Positive for weakness ("all over weakness"). Negative for dizziness, syncope, speech difficulty, light-headedness and headaches.  Hematological: Negative for adenopathy. Does not bruise/bleed easily.  Psychiatric/Behavioral: Positive for behavioral problems, confusion and decreased concentration. Negative for agitation, dysphoric mood, hallucinations, self-injury, sleep disturbance and suicidal ideas. The patient is not nervous/anxious and is not hyperactive.     Objective:  BP (!) 148/86 (BP Location: Left Arm, Patient Position: Sitting, Cuff Size: Normal)   Pulse (!) 108   Temp 97.7 F (36.5 C) (Oral)   Resp 16   Ht 5\' 2"  (1.575 m)   Wt 113 lb (51.3 kg)   SpO2 94%   BMI 20.67 kg/m   BP Readings from Last 3 Encounters:  12/13/18 (!) 148/86  12/13/18 (!) 148/86  12/01/18 138/80    Wt Readings from Last 3 Encounters:  12/13/18 113 lb (51.3 kg)  12/13/18 113 lb (51.3 kg)  06/13/18 117 lb 8 oz (53.3 kg)    Physical Exam Constitutional:      Appearance: Normal appearance. She is not ill-appearing or diaphoretic.  HENT:     Mouth/Throat:     Mouth: Mucous membranes are moist.  Pharynx: Oropharynx is clear. No oropharyngeal exudate or posterior oropharyngeal erythema.  Eyes:     General: No visual field deficit or scleral icterus.       Left eye: No discharge.     Extraocular Movements: Extraocular movements intact.     Conjunctiva/sclera: Conjunctivae normal.     Pupils: Pupils are equal, round, and reactive to light.  Neck:     Musculoskeletal: Normal range of motion and neck supple.    Cardiovascular:     Rate and Rhythm: Regular rhythm. Tachycardia present.     Pulses: Normal pulses.     Heart sounds: Normal heart sounds. No murmur. No gallop.   Pulmonary:     Effort: Pulmonary effort is normal. No respiratory distress.     Breath sounds: Normal breath sounds. No stridor. No wheezing or rales.  Chest:     Chest wall: No tenderness.  Abdominal:     General: Abdomen is flat. Bowel sounds are normal.     Palpations: There is no hepatomegaly, splenomegaly or mass.     Tenderness: There is no abdominal tenderness. There is no guarding.  Genitourinary:    Comments: She refused to breast, GU, and rectal exams. Musculoskeletal: Normal range of motion.        General: No swelling.     Right lower leg: No edema.     Left lower leg: No edema.  Skin:    General: Skin is warm and dry.  Neurological:     General: No focal deficit present.     Mental Status: She is oriented to person, place, and time.     Cranial Nerves: Cranial nerves are intact. No cranial nerve deficit, dysarthria or facial asymmetry.     Sensory: Sensation is intact.     Motor: No weakness, tremor, atrophy, abnormal muscle tone or seizure activity.     Coordination: Romberg sign positive. Coordination abnormal.     Deep Tendon Reflexes: Reflexes normal.  Psychiatric:        Attention and Perception: She is inattentive.        Mood and Affect: Mood normal.        Speech: Speech normal.        Behavior: Behavior normal. Behavior is cooperative.        Thought Content: Thought content normal.        Cognition and Memory: Cognition is impaired. Memory is impaired. She exhibits impaired recent memory and impaired remote memory.        Judgment: Judgment normal. Judgment is not impulsive or inappropriate.     Lab Results  Component Value Date   WBC 10.0 06/13/2018   HGB 15.8 (H) 06/13/2018   HCT 46.9 (H) 06/13/2018   PLT 309.0 06/13/2018   GLUCOSE 111 (H) 06/13/2018   CHOL 263 (H) 01/26/2017    TRIG 114.0 01/26/2017   HDL 51.90 01/26/2017   LDLCALC 189 (H) 01/26/2017   ALT 25 06/13/2018   AST 18 06/13/2018   NA 135 06/13/2018   K 3.9 06/13/2018   CL 101 06/13/2018   CREATININE 0.87 06/13/2018   BUN 14 06/13/2018   CO2 27 06/13/2018   TSH 0.04 (L) 06/13/2018   INR 1.0 05/16/2009   HGBA1C 6.3 06/13/2018   MICROALBUR 10 06/13/2018    Dg Ribs Unilateral Right  Result Date: 11/30/2018 CLINICAL DATA:  [place] 4 days ago.  Right-sided chest pain. EXAM: RIGHT RIBS - 2 VIEW COMPARISON:  Chest x-ray 12/23/2015 FINDINGS: There is a nondisplaced fracture  involving the sixth and seventh anterior ribs. No other definite fractures are identified. Streaky right basilar atelectasis but no pleural effusion or pneumothorax. IMPRESSION: Right sixth and seventh rib fractures. Streaky atelectasis but no effusion or pneumothorax. Electronically Signed   By: Marijo Sanes M.D.   On: 11/30/2018 14:41   Dg Hips Bilat With Pelvis 2v  Result Date: 11/30/2018 CLINICAL DATA:  [place] 4 days ago.  Bilateral hip pain. EXAM: DG HIP (WITH OR WITHOUT PELVIS) 2V BILAT COMPARISON:  None. FINDINGS: Both hips are normally located. Mild degenerative changes bilaterally for age. No acute hip fracture or plain film evidence of avascular necrosis. The pubic symphysis and SI joints are intact. Mild degenerative changes. No definite acute pelvic fractures. Remote left-sided pubic fractures are noted. IMPRESSION: Remote appearing left-sided pubic bone fractures. No acute hip or pelvic fractures are identified. Electronically Signed   By: Marijo Sanes M.D.   On: 11/30/2018 14:37    Assessment & Plan:   Briyanna was seen today for annual exam, hypertension, hypothyroidism and diabetes.  Diagnoses and all orders for this visit:  Unsteady gait -     Shower chair -     DME Other see comment  Essential hypertension- Her blood pressure is adequately well controlled. -     CBC with Differential/Platelet; Future  Acquired  hypothyroidism- I will recheck her TSH and will adjust her dose if indicated. -     TSH; Future  Type II diabetes mellitus with manifestations ([place])- I will monitor her A1c and will treat if indicated. -     Comprehensive metabolic panel; Future -     Hemoglobin A1c; Future  Malignant neoplasm of overlapping sites of both breasts in female, estrogen receptor negative ([place])- I will check her labs and an MRI of the brain to see if there is any evidence of metastatic disease. -     Comprehensive metabolic panel; Future  Hypercalcemia- I will monitor her calcium level. -     Comprehensive metabolic panel; Future -     PTH, intact and calcium; Future  Hypercholesteremia- She is due for an annual lipid panel. -     Lipid panel; Future  Routine general medical examination at a health care facility  Ataxia- I will check labs to screen for secondary causes.  I have also asked her to undergo an MRI of the brain to see if there has been a CVA, metastatic disease, atrophy, NPH, or demyelinating disease. -     Vitamin B12; Future -     Folate; Future -     MR Brain Wo Contrast; Future -     Cancel: Ambulatory referral to Audiology -     Ambulatory referral to [place]  Cerebellar ataxia in diseases classified elsewhere [facility]) -     MR Brain Wo Contrast; Future -     Cancel: Ambulatory referral to Audiology -     Ambulatory referral to [place] hearing loss (SNHL) of both ears -     Ambulatory referral to Audiology   I am having [facility] [employer] maintain her tamoxifen, amLODipine, Ascorbic Acid (VITAMIN C PO), Cholecalciferol (VITAMIN D PO), triamterene-hydrochlorothiazide, rosuvastatin, losartan, potassium chloride, traMADol, and levothyroxine.  No orders of the defined types were placed in this encounter.  See AVS for instructions about healthy living and anticipatory guidance.  Follow-up: Return in about 3 months (around 03/13/2019).  Scarlette Calico, MD

## 2018-12-13 NOTE — Progress Notes (Addendum)
Subjective:   OUIDA Horton is a 85 y.o. female who presents for Medicare Annual (Subsequent) preventive examination.  Review of Systems:  No ROS.  Medicare Wellness Visit. Additional risk factors are reflected in the social history.  Cardiac Risk Factors include: advanced age (>13men, >78 women);diabetes mellitus;dyslipidemia;hypertension Sleep patterns: feels rested on waking, gets up 1 times nightly to void and sleeps 7-8 hours nightly.    Home Safety/Smoke Alarms: Feels safe in home. Smoke alarms in place.  Living environment; residence and Firearm Safety: 1-story house/ trailer, no firearms. Lives alone,needs tub bench  DME for safety, good support system Seat Belt Safety/Bike Helmet: Wears seat belt.    Objective:     Vitals: BP (!) 148/86   Pulse (!) 108   Ht 5\' 2"  (1.575 m)   Wt 113 lb (51.3 kg)   SpO2 94%   BMI 20.67 kg/m   Body mass index is 20.67 kg/m.  Advanced Directives 12/13/2018 08/17/2017 07/08/2016  Does Patient Have a Medical Advance Directive? Yes Yes No  Type of Paramedic of [place];Living will [place];Living will -  Copy of [place] in Chart? No - copy requested No - copy requested -  Would patient like information on creating a medical advance directive? - - No - patient declined information    Tobacco Social History   Tobacco Use  Smoking Status Current Some Day Smoker  . Packs/day: 0.25  . Types: Cigarettes  . Last attempt to quit: 10/14/2015  . Years since quitting: 3.1  Smokeless Tobacco Never Used     Ready to quit: No Counseling given: No  Past Medical History:  Diagnosis Date  . Breast cancer ([place])   . Cigarette smoker   . Constipation   . Hypercholesteremia   . Hypertension   . Hypothyroid   . Osteoarthritis    Past Surgical History:  Procedure Laterality Date  . bladder tact  2011  . BREAST LUMPECTOMY  2009  . PARTIAL HYSTERECTOMY  1972  . ROTATOR CUFF  REPAIR     Family History  Problem Relation Age of Onset  . Breast cancer Daughter   . Diabetes Daughter   . Breast cancer Sister        3 sisters  . Aneurysm Father    Social History   Socioeconomic History  . Marital status: Single    Spouse name: Not on file  . Number of children: 3  . Years of education: 34  . Highest education level: Not on file  Occupational History  . Occupation: caregiver    Employer: RETIRED  Social Needs  . Financial resource strain: Not very hard  . Food insecurity:    Worry: Never true    Inability: Never true  . Transportation needs:    Medical: No    Non-medical: No  Tobacco Use  . Smoking status: Current Some Day Smoker    Packs/day: 0.25    Types: Cigarettes    Last attempt to quit: 10/14/2015    Years since quitting: 3.1  . Smokeless tobacco: Never Used  Substance and Sexual Activity  . Alcohol use: No    Alcohol/week: 0.0 standard drinks  . Drug use: No  . Sexual activity: Not Currently    Birth control/protection: Post-menopausal, Surgical  Lifestyle  . Physical activity:    Days per week: 0 days    Minutes per session: 0 min  . Stress: Only a little  Relationships  .  Social connections:    Talks on phone: More than three times a week    Gets together: More than three times a week    Attends religious service: 1 to 4 times per year    Active member of club or organization: No    Attends meetings of clubs or organizations: Never    Relationship status: Not on file  Other Topics Concern  . Not on file  Social History Narrative   2 sisters with breast cancer   Friend--Howard Burch    Outpatient Encounter Medications as of 12/13/2018  Medication Sig  . amLODipine (NORVASC) 10 MG tablet TAKE ONE TABLET BY MOUTH ONCE DAILY  . Ascorbic Acid (VITAMIN C PO) Take 1 tablet by mouth daily.  . Cholecalciferol (VITAMIN D PO) Take 1 tablet by mouth daily.  Marland Kitchen levothyroxine (SYNTHROID, LEVOTHROID) 50 MCG tablet Take 1 tablet (50 mcg  total) by mouth daily.  Marland Kitchen losartan (COZAAR) 50 MG tablet TAKE ONE TABLET EACH DAY  . potassium chloride (K-DUR) 10 MEQ tablet TAKE ONE TABLET EACH DAY  . rosuvastatin (CRESTOR) 10 MG tablet TAKE ONE TABLET BY MOUTH ONCE DAILY  . tamoxifen (NOLVADEX) 20 MG tablet TAKE ONE TABLET EACH DAY  . traMADol (ULTRAM) 50 MG tablet Take 1 tablet (50 mg total) by mouth every 8 (eight) hours as needed.  . triamterene-hydrochlorothiazide (MAXZIDE-25) 37.5-25 MG tablet Take 0.5 tablets daily by mouth.   No facility-administered encounter medications on file as of 12/13/2018.     Activities of Daily Living In your present state of health, do you have any difficulty performing the following activities: 12/13/2018  Hearing? N  Vision? N  Difficulty concentrating or making decisions? N  Walking or climbing stairs? Y  Dressing or bathing? N  Doing errands, shopping? Y  Preparing Food and eating ? N  Using the Toilet? N  In the past six months, have you accidently leaked urine? N  Do you have problems with loss of bowel control? N  Managing your Medications? N  Managing your Finances? Y  Housekeeping or managing your Housekeeping? N  Some recent data might be hidden    Patient Care Team: Janith Lima, MD as PCP - General (Internal Medicine)    Assessment:   This is a routine wellness examination for Jackie Horton. Physical assessment deferred to PCP.   Exercise Activities and Dietary recommendations Current Exercise Habits: The patient does not participate in regular exercise at present, Exercise limited by: orthopedic condition(s)  Diet (meal preparation, eat out, water intake, caffeinated beverages, dairy products, fruits and vegetables): in general, a "healthy" diet   Patient denies poor appetite.    Discussed supplementing with Ensure and to add snacks to daily diet.  Encouraged patient to increase daily water and healthy fluid intake. Reviewed heart healthy and diabetic diet  Goals    .  Maintain current health status     Stay as healthy and as independent as possible    . Patient Stated     Stay in my home and stay as independent as possible.       Fall Risk Fall Risk  12/13/2018 08/17/2017 08/10/2017 01/26/2017 07/08/2016  Falls in the past year? 1 No No No No  Number falls in past yr: 1 - - - -  Injury with Fall? 1 - - - -  Risk for fall due to : Impaired balance/gait;Mental status change - - - -  Follow up Falls evaluation completed;Education provided - - - -  Depression Screen PHQ 2/9 Scores 12/13/2018 08/17/2017 08/10/2017 01/26/2017  PHQ - 2 Score 0 1 0 -  PHQ- 9 Score - 1 - -  Exception Documentation - - - Medical reason     Cognitive Function MMSE - Mini Mental State Exam 12/13/2018 08/17/2017  Not completed: Refused -  Orientation to time - 4  Orientation to Place - 5  Registration - 3  Attention/ Calculation - 3  Recall - 2  Language- name 2 objects - 2  Language- repeat - 1  Language- follow 3 step command - 3  Language- read & follow direction - 1  Write a sentence - 1  Copy design - 1  Total score - 26        Immunization History  Administered Date(s) Administered  . Influenza Split 07/25/2012  . Influenza, High Dose Seasonal PF 10/14/2015, 07/06/2016, 08/10/2017, 12/13/2018  . Influenza,inj,Quad PF,6+ Mos 07/20/2013, 10/08/2014  . Pneumococcal Conjugate-13 02/21/2014  . Pneumococcal Polysaccharide-23 03/16/2012, 08/10/2017  . Tdap 02/21/2014   Screening Tests Health Maintenance  Topic Date Due  . OPHTHALMOLOGY EXAM  12/13/2018 (Originally 10/08/2016)  . DEXA SCAN  12/09/2019 (Originally 07/07/2000)  . HEMOGLOBIN A1C  12/14/2018  . FOOT EXAM  06/15/2019  . TETANUS/TDAP  02/22/2024  . INFLUENZA VACCINE  Completed  . PNA vac Low Risk Adult  Completed      Plan:     Reviewed health maintenance screenings with patient today and relevant education, vaccines, and/or referrals were provided.   Continue doing brain stimulating  activities (puzzles, reading, adult coloring books, staying active) to keep memory sharp.   Continue to eat heart healthy diet (full of fruits, vegetables, whole grains, lean protein, water--limit salt, fat, and sugar intake) and increase physical activity as tolerated.  I have personally reviewed and noted the following in the patient's chart:   . Medical and social history . Use of alcohol, tobacco or illicit drugs  . Current medications and supplements . Functional ability and status . Nutritional status . Physical activity . Advanced directives . List of other physicians . Vitals . Screenings to include cognitive, depression, and falls . Referrals and appointments  In addition, I have reviewed and discussed with patient certain preventive protocols, quality metrics, and best practice recommendations. A written personalized care plan for preventive services as well as general preventive health recommendations were provided to patient.     Michiel Cowboy, RN  12/13/2018  Medical screening examination/treatment/procedure(s) were performed by non-physician practitioner and as supervising physician I was immediately available for consultation/collaboration. I agree with above. Scarlette Calico, MD

## 2018-12-13 NOTE — Patient Instructions (Signed)

## 2018-12-13 NOTE — Progress Notes (Signed)
Patient consent obtained. Irrigation of the left ear completed with water and peroxide performed. Full view of tympanic membranes after procedure.  Patient tolerated procedure well.

## 2018-12-14 ENCOUNTER — Telehealth: Payer: Self-pay | Admitting: Internal Medicine

## 2018-12-14 NOTE — Telephone Encounter (Signed)
Will wait for form to be scanned to chart.

## 2018-12-14 NOTE — Telephone Encounter (Signed)
Copied from [place] [phone]. Topic: Quick Communication - See Telephone Encounter >> Dec 14, 2018 12:04 PM Rutherford Nail, NT wrote: CRM for notification. See Telephone encounter for: 12/14/18. Patient's daughter, Josie Dixon, calling in regards to a paper that the office gave the patient to help her try to get personal care services, would like to know if this could hold off until Monday? Also, would like to know if Dr Ronnald Ramp could help the patient get medicaid? CB#: [phone]

## 2018-12-15 ENCOUNTER — Telehealth: Payer: Self-pay | Admitting: Internal Medicine

## 2018-12-15 MED ORDER — AMLODIPINE BESYLATE 10 MG PO TABS: 10.0000 mg | ORAL_TABLET | Freq: Every day | ORAL | 1 refills | Status: DC

## 2018-12-15 MED ORDER — TRIAMTERENE-HCTZ 37.5-25 MG PO TABS: 0.5000 | ORAL_TABLET | Freq: Every day | ORAL | 1 refills | Status: DC

## 2018-12-15 NOTE — Telephone Encounter (Signed)
Spoke to pt dtr (Terri) and informed that the 2 BP meds are triamterene-HCTZ and amlodipine. Erx has been sent it.  Recommended checking pt bp before taking medication.

## 2018-12-15 NOTE — Assessment & Plan Note (Signed)

## 2018-12-15 NOTE — Telephone Encounter (Signed)
Copied from [place] [phone]. Topic: General - Other >> Dec 15, 2018  2:40 PM Keene Breath wrote: Reason for CRM: Patient's daughter called to ask the nurse or doctor  whether her mother should continue to take all of her medications on her list.  Daughter said that the patient misplaced some of the medications and was not sure if she should be taking them.  Please advise and call back as soon as possible.  CB# [phone]

## 2018-12-15 NOTE — Telephone Encounter (Signed)
Do you have a PCS form?

## 2018-12-16 ENCOUNTER — Telehealth: Payer: Self-pay | Admitting: Internal Medicine

## 2018-12-16 NOTE — Telephone Encounter (Signed)
Copied from [place] [phone]. Topic: Quick Communication - See Telephone Encounter >> Dec 16, 2018  4:46 PM Bea Graff, NT wrote: CRM for notification. See Telephone encounter for: 12/16/18. Inez Catalina with [facility] states she went to see the pt today and no one came to the door at first. Then the daughter came and questioned why she was there and that they will call her next week if they decide on treatment. CB#: [phone]

## 2018-12-16 NOTE — Telephone Encounter (Signed)
Pt daughter terri calling back to check status and would like to know if someone can call her back. Please advise

## 2018-12-19 ENCOUNTER — Ambulatory Visit
Admission: RE | Admit: 2018-12-19 | Discharge: 2018-12-19 | Disposition: A | Discharge status: Home / Self Care | Payer: Medicare Other | Source: Ambulatory Visit | Attending: Internal Medicine | Admitting: Internal Medicine

## 2018-12-19 DIAGNOSIS — R296 Repeated falls: Secondary | ICD-10-CM | POA: Diagnosis not present

## 2018-12-19 DIAGNOSIS — G3281 Cerebellar ataxia in diseases classified elsewhere: Secondary | ICD-10-CM

## 2018-12-19 DIAGNOSIS — R410 Disorientation, unspecified: Secondary | ICD-10-CM | POA: Diagnosis not present

## 2018-12-19 DIAGNOSIS — R270 Ataxia, unspecified: Secondary | ICD-10-CM

## 2018-12-20 NOTE — Telephone Encounter (Signed)
Spoke to pt dtr and she stated to wait for the results before sending the paper work.

## 2018-12-21 DIAGNOSIS — E119 Type 2 diabetes mellitus without complications: Secondary | ICD-10-CM | POA: Diagnosis not present

## 2018-12-21 DIAGNOSIS — I10 Essential (primary) hypertension: Secondary | ICD-10-CM | POA: Diagnosis not present

## 2018-12-21 DIAGNOSIS — E039 Hypothyroidism, unspecified: Secondary | ICD-10-CM | POA: Diagnosis not present

## 2018-12-21 NOTE — Telephone Encounter (Signed)
I am completing this note while I await for pt dtr Coralyn Mark) to let me know when to send the form.

## 2018-12-22 ENCOUNTER — Telehealth: Payer: Self-pay | Admitting: Internal Medicine

## 2018-12-22 DIAGNOSIS — I10 Essential (primary) hypertension: Secondary | ICD-10-CM | POA: Diagnosis not present

## 2018-12-22 DIAGNOSIS — E039 Hypothyroidism, unspecified: Secondary | ICD-10-CM | POA: Diagnosis not present

## 2018-12-22 DIAGNOSIS — E119 Type 2 diabetes mellitus without complications: Secondary | ICD-10-CM | POA: Diagnosis not present

## 2018-12-22 NOTE — Telephone Encounter (Signed)
They have to be done in the office

## 2018-12-22 NOTE — Telephone Encounter (Signed)
Called patient's daughter and informed.  She was also wanting to follow up on MRI.

## 2018-12-22 NOTE — Telephone Encounter (Signed)
Copied from [place] [phone]. Topic: General - Other >> Dec 22, 2018 10:22 AM Keene Breath wrote: Reason for CRM: Terri, patient's daughter, called to inform the doctor or nurse that the patient needs to get the DNR and MOLST forms.  Daughter wants to know if the patient should come to the office or can these forms be mailed to the patient.  Also, the patient would like her test results as soon as possible.  Please call daughter at [phone]

## 2018-12-23 NOTE — Telephone Encounter (Signed)
Contacted dtr and informed that I did not have the MRI results yet.

## 2018-12-26 DIAGNOSIS — E039 Hypothyroidism, unspecified: Secondary | ICD-10-CM | POA: Diagnosis not present

## 2018-12-26 DIAGNOSIS — E119 Type 2 diabetes mellitus without complications: Secondary | ICD-10-CM | POA: Diagnosis not present

## 2018-12-26 DIAGNOSIS — I10 Essential (primary) hypertension: Secondary | ICD-10-CM | POA: Diagnosis not present

## 2018-12-26 NOTE — Telephone Encounter (Signed)
Can you release the MRI results to me?

## 2018-12-26 NOTE — Telephone Encounter (Signed)
The MRI showed an old developmental abnormality on the left side of the brain that look like an old stroke but no recent strokes, no masses, no bleeding.

## 2018-12-27 ENCOUNTER — Telehealth: Payer: Self-pay | Admitting: Internal Medicine

## 2018-12-27 DIAGNOSIS — E039 Hypothyroidism, unspecified: Secondary | ICD-10-CM | POA: Diagnosis not present

## 2018-12-27 DIAGNOSIS — I10 Essential (primary) hypertension: Secondary | ICD-10-CM | POA: Diagnosis not present

## 2018-12-27 DIAGNOSIS — E119 Type 2 diabetes mellitus without complications: Secondary | ICD-10-CM | POA: Diagnosis not present

## 2018-12-27 NOTE — Telephone Encounter (Signed)
Called Deana with [facility] contacted and given verbal okay per PCP as requested.

## 2018-12-27 NOTE — Telephone Encounter (Signed)
Jackie Horton contacted and informed of MRI results.

## 2018-12-27 NOTE — Telephone Encounter (Signed)
Copied from [place] [phone]. Topic: Quick Communication - Home Health Verbal Orders >> Dec 27, 2018  4:27 PM Yvette Rack wrote: Caller/Agency: Deana with [facility] Number: [phone] Requesting OT/PT/Skilled Nursing/Social Work: skilled nursing Frequency: 1 time a week for 3 weeks and add OT for cognition and upper body strengthening

## 2018-12-28 DIAGNOSIS — H903 Sensorineural hearing loss, bilateral: Secondary | ICD-10-CM

## 2018-12-28 DIAGNOSIS — E119 Type 2 diabetes mellitus without complications: Secondary | ICD-10-CM | POA: Diagnosis not present

## 2018-12-28 DIAGNOSIS — G119 Hereditary ataxia, unspecified: Secondary | ICD-10-CM

## 2018-12-28 DIAGNOSIS — E039 Hypothyroidism, unspecified: Secondary | ICD-10-CM | POA: Diagnosis not present

## 2018-12-28 DIAGNOSIS — E7800 Pure hypercholesterolemia, unspecified: Secondary | ICD-10-CM

## 2018-12-28 DIAGNOSIS — F0280 Dementia in other diseases classified elsewhere without behavioral disturbance: Secondary | ICD-10-CM | POA: Diagnosis not present

## 2018-12-28 DIAGNOSIS — R296 Repeated falls: Secondary | ICD-10-CM

## 2018-12-28 DIAGNOSIS — F17210 Nicotine dependence, cigarettes, uncomplicated: Secondary | ICD-10-CM

## 2018-12-28 DIAGNOSIS — Z853 Personal history of malignant neoplasm of breast: Secondary | ICD-10-CM

## 2018-12-28 DIAGNOSIS — I10 Essential (primary) hypertension: Secondary | ICD-10-CM | POA: Diagnosis not present

## 2018-12-28 DIAGNOSIS — Z9181 History of falling: Secondary | ICD-10-CM

## 2018-12-29 ENCOUNTER — Telehealth: Payer: Self-pay

## 2018-12-29 ENCOUNTER — Telehealth: Payer: Self-pay | Admitting: Internal Medicine

## 2018-12-29 DIAGNOSIS — E118 Type 2 diabetes mellitus with unspecified complications: Secondary | ICD-10-CM

## 2018-12-29 NOTE — Telephone Encounter (Signed)
Pt dtr also requested referral to podiatry. Referral has been entered.

## 2018-12-29 NOTE — Telephone Encounter (Signed)
Copied from [place] [phone]. Topic: General - Other >> Dec 26, 2018  2:15 PM Leward Quan A wrote: Reason for CRM: Mindi Curling patient daughter called to ask for a call with the MRI results. Michela Pitcher that she have been talking to a [place].  Ph# [phone] >> Dec 29, 2018 11:43 AM Ahmed Prima L wrote: Patient's daughter, Karna Christmas would like Steffanie to call her in regards to this. Thanks >> Dec 29, 2018  1:03 PM Cairrikier Dian Queen, CMA wrote: Spoke to [place] and gave MRI results.

## 2018-12-29 NOTE — Telephone Encounter (Signed)
Copied from [place] [id number]. Topic: Quick Communication - See Telephone Encounter >> Dec 29, 2018  2:52 PM Antonieta Iba C wrote: CRM for notification. See Telephone encounter for: 12/29/18.  Deborra Medina NP with [employer] called in to report to provider that pt was seen today. Pt was tested and failed her mini cog because she was unable to verify 3 words or complete cog   CB: [phone]    When call is returned, please verify with Diane the pt's DOB. Diane is stating that she has in her system that pt's DOB is 1933/09/16 but I verified via pt's ID in chart that pt's DOB is correct in our chart.   Called Diane back to update her, no answer.

## 2018-12-29 NOTE — Telephone Encounter (Signed)
Noted - pt failed mini cog.   No action needed - fyi only

## 2018-12-31 ENCOUNTER — Other Ambulatory Visit: Payer: Self-pay | Admitting: Internal Medicine

## 2018-12-31 DIAGNOSIS — E7800 Pure hypercholesterolemia, unspecified: Secondary | ICD-10-CM

## 2019-01-02 DIAGNOSIS — E039 Hypothyroidism, unspecified: Secondary | ICD-10-CM | POA: Diagnosis not present

## 2019-01-02 DIAGNOSIS — E119 Type 2 diabetes mellitus without complications: Secondary | ICD-10-CM | POA: Diagnosis not present

## 2019-01-02 DIAGNOSIS — I10 Essential (primary) hypertension: Secondary | ICD-10-CM | POA: Diagnosis not present

## 2019-01-03 DIAGNOSIS — E119 Type 2 diabetes mellitus without complications: Secondary | ICD-10-CM | POA: Diagnosis not present

## 2019-01-03 DIAGNOSIS — I10 Essential (primary) hypertension: Secondary | ICD-10-CM | POA: Diagnosis not present

## 2019-01-03 DIAGNOSIS — E039 Hypothyroidism, unspecified: Secondary | ICD-10-CM | POA: Diagnosis not present

## 2019-01-04 DIAGNOSIS — I10 Essential (primary) hypertension: Secondary | ICD-10-CM | POA: Diagnosis not present

## 2019-01-04 DIAGNOSIS — E039 Hypothyroidism, unspecified: Secondary | ICD-10-CM | POA: Diagnosis not present

## 2019-01-04 DIAGNOSIS — E119 Type 2 diabetes mellitus without complications: Secondary | ICD-10-CM | POA: Diagnosis not present

## 2019-01-11 DIAGNOSIS — I10 Essential (primary) hypertension: Secondary | ICD-10-CM | POA: Diagnosis not present

## 2019-01-11 DIAGNOSIS — E119 Type 2 diabetes mellitus without complications: Secondary | ICD-10-CM | POA: Diagnosis not present

## 2019-01-11 DIAGNOSIS — E039 Hypothyroidism, unspecified: Secondary | ICD-10-CM | POA: Diagnosis not present

## 2019-01-18 DIAGNOSIS — E039 Hypothyroidism, unspecified: Secondary | ICD-10-CM | POA: Diagnosis not present

## 2019-01-18 DIAGNOSIS — E119 Type 2 diabetes mellitus without complications: Secondary | ICD-10-CM | POA: Diagnosis not present

## 2019-01-18 DIAGNOSIS — I10 Essential (primary) hypertension: Secondary | ICD-10-CM | POA: Diagnosis not present

## 2019-01-19 ENCOUNTER — Other Ambulatory Visit: Payer: Self-pay | Admitting: Internal Medicine

## 2019-03-14 ENCOUNTER — Other Ambulatory Visit: Payer: Self-pay

## 2019-03-14 ENCOUNTER — Other Ambulatory Visit: Payer: Self-pay | Admitting: Internal Medicine

## 2019-03-14 ENCOUNTER — Other Ambulatory Visit (INDEPENDENT_AMBULATORY_CARE_PROVIDER_SITE_OTHER): Payer: Medicare Other

## 2019-03-14 ENCOUNTER — Ambulatory Visit (INDEPENDENT_AMBULATORY_CARE_PROVIDER_SITE_OTHER): Payer: Medicare Other | Admitting: Internal Medicine

## 2019-03-14 ENCOUNTER — Encounter: Payer: Self-pay | Admitting: Internal Medicine

## 2019-03-14 VITALS — BP 130/80 | HR 109 | Temp 97.6°F | Ht 62.0 in | Wt 112.0 lb

## 2019-03-14 DIAGNOSIS — H903 Sensorineural hearing loss, bilateral: Secondary | ICD-10-CM | POA: Diagnosis not present

## 2019-03-14 DIAGNOSIS — I10 Essential (primary) hypertension: Secondary | ICD-10-CM

## 2019-03-14 DIAGNOSIS — E118 Type 2 diabetes mellitus with unspecified complications: Secondary | ICD-10-CM

## 2019-03-14 DIAGNOSIS — R270 Ataxia, unspecified: Secondary | ICD-10-CM

## 2019-03-14 DIAGNOSIS — G3281 Cerebellar ataxia in diseases classified elsewhere: Secondary | ICD-10-CM

## 2019-03-14 DIAGNOSIS — Z171 Estrogen receptor negative status [ER-]: Secondary | ICD-10-CM

## 2019-03-14 DIAGNOSIS — C50811 Malignant neoplasm of overlapping sites of right female breast: Secondary | ICD-10-CM

## 2019-03-14 DIAGNOSIS — C50812 Malignant neoplasm of overlapping sites of left female breast: Secondary | ICD-10-CM

## 2019-03-14 DIAGNOSIS — E039 Hypothyroidism, unspecified: Secondary | ICD-10-CM | POA: Diagnosis not present

## 2019-03-14 LAB — COMPREHENSIVE METABOLIC PANEL
ALT: 33 U/L (ref 0–35)
AST: 25 U/L (ref 0–37)
Albumin: 4.7 g/dL (ref 3.5–5.2)
Alkaline Phosphatase: 101 U/L (ref 39–117)
BUN: 18 mg/dL (ref 6–23)
CO2: 25 mEq/L (ref 19–32)
Calcium: 10.8 mg/dL — ABNORMAL HIGH (ref 8.4–10.5)
Chloride: 106 mEq/L (ref 96–112)
Creatinine, Ser: 0.92 mg/dL (ref 0.40–1.20)
GFR: 70.42 mL/min (ref >60.00)
Glucose, Bld: 119 mg/dL — ABNORMAL HIGH (ref 70–99)
Potassium: 3.8 mEq/L (ref 3.5–5.1)
Sodium: 139 mEq/L (ref 135–145)
Total Bilirubin: 0.5 mg/dL (ref 0.2–1.2)
Total Protein: 8.1 g/dL (ref 6.0–8.3)

## 2019-03-14 LAB — CBC WITH DIFFERENTIAL/PLATELET
Basophils Absolute: 0.1 10*3/uL (ref 0.0–0.1)
Basophils Relative: 1 % (ref 0.0–3.0)
Eosinophils Absolute: 0.1 10*3/uL (ref 0.0–0.7)
Eosinophils Relative: 1.1 % (ref 0.0–5.0)
HCT: 44.6 % (ref 36.0–46.0)
Hemoglobin: 15.3 g/dL — ABNORMAL HIGH (ref 12.0–15.0)
Lymphocytes Relative: 45.2 % (ref 12.0–46.0)
Lymphs Abs: 4.5 10*3/uL — ABNORMAL HIGH (ref 0.7–4.0)
MCHC: 34.4 g/dL (ref 30.0–36.0)
MCV: 90.6 fl (ref 78.0–100.0)
Monocytes Absolute: 0.6 10*3/uL (ref 0.1–1.0)
Monocytes Relative: 6.1 % (ref 3.0–12.0)
Neutro Abs: 4.6 10*3/uL (ref 1.4–7.7)
Neutrophils Relative %: 46.6 % (ref 43.0–77.0)
Platelets: 198 10*3/uL (ref 150.0–400.0)
RBC: 4.92 Mil/uL (ref 3.87–5.11)
RDW: 15 % (ref 11.5–15.5)
WBC: 9.9 10*3/uL (ref 4.0–10.5)

## 2019-03-14 LAB — FOLATE: Folate: 6.7 ng/mL (ref >5.9)

## 2019-03-14 LAB — HEMOGLOBIN A1C: Hgb A1c MFr Bld: 6.6 % — ABNORMAL HIGH (ref 4.6–6.5)

## 2019-03-14 LAB — VITAMIN B12: Vitamin B-12: 359 pg/mL (ref 211–911)

## 2019-03-14 LAB — TSH: TSH: 12.58 u[IU]/mL — ABNORMAL HIGH (ref 0.35–4.50)

## 2019-03-14 MED ORDER — LOSARTAN POTASSIUM 50 MG PO TABS: ORAL_TABLET | ORAL | 1 refills | Status: DC

## 2019-03-14 MED ORDER — LEVOTHYROXINE SODIUM 75 MCG PO TABS: 75.0000 ug | ORAL_TABLET | Freq: Every day | ORAL | 0 refills | Status: DC

## 2019-03-14 NOTE — Progress Notes (Signed)
Subjective:  Patient ID: Jackie Horton, female    DOB: 1933/02/12  Age: 85 y.o. MRN: [id number]  CC: Hypothyroidism; Diabetes; and Hyperlipidemia   HPI Jackie Horton presents for f/up - She continues to complain of fatigue and constipation.  She is getting adequate relief from the constipation with over-the-counter treatment options.  She is not sure that she has been compliant with the levothyroxine. She tells me her blood pressure has been well controlled and she denies chest pain, shortness of breath, palpitations, or edema.  Outpatient Medications Prior to Visit  Medication Sig Dispense Refill  . amLODipine (NORVASC) 10 MG tablet Take 1 tablet (10 mg total) by mouth daily. 90 tablet 1  . Ascorbic Acid (VITAMIN C PO) Take 1 tablet by mouth daily.    . Cholecalciferol (VITAMIN D PO) Take 1 tablet by mouth daily.    . potassium chloride (K-DUR) 10 MEQ tablet TAKE ONE TABLET DAILY 90 tablet 1  . rosuvastatin (CRESTOR) 10 MG tablet TAKE ONE TABLET EACH DAY 90 tablet 1  . traMADol (ULTRAM) 50 MG tablet Take 1 tablet (50 mg total) by mouth every 8 (eight) hours as needed. 20 tablet 0  . triamterene-hydrochlorothiazide (MAXZIDE-25) 37.5-25 MG tablet Take 0.5 tablets by mouth daily. 135 tablet 1  . levothyroxine (SYNTHROID, LEVOTHROID) 50 MCG tablet Take 1 tablet (50 mcg total) by mouth daily. 90 tablet 0  . losartan (COZAAR) 50 MG tablet TAKE ONE TABLET EACH DAY 90 tablet 1   No facility-administered medications prior to visit.     ROS Review of Systems  Constitutional: Positive for fatigue. Negative for appetite change, diaphoresis and unexpected weight change.  HENT: Negative.   Eyes: Negative for visual disturbance.  Respiratory: Negative for cough, chest tightness, shortness of breath and wheezing.   Cardiovascular: Negative for chest pain, palpitations and leg swelling.  Gastrointestinal: Positive for constipation. Negative for abdominal pain and vomiting.  Endocrine:  Negative for cold intolerance and heat intolerance.  Genitourinary: Negative.  Negative for difficulty urinating.  Musculoskeletal: Negative.  Negative for arthralgias.  Skin: Negative.   Neurological: Negative.  Negative for dizziness, weakness, light-headedness and headaches.  Hematological: Negative for adenopathy. Does not bruise/bleed easily.  Psychiatric/Behavioral: Negative.     Objective:  BP 130/80 (BP Location: Left Arm, Patient Position: Sitting, Cuff Size: Normal)   Pulse (!) 109   Temp 97.6 F (36.4 C) (Oral)   Ht 5\' 2"  (1.575 m)   Wt 112 lb (50.8 kg)   SpO2 97%   BMI 20.49 kg/m   BP Readings from Last 3 Encounters:  03/14/19 130/80  12/13/18 (!) 148/86  12/13/18 (!) 148/86    Wt Readings from Last 3 Encounters:  03/14/19 112 lb (50.8 kg)  12/13/18 113 lb (51.3 kg)  12/13/18 113 lb (51.3 kg)    Physical Exam Vitals signs reviewed.  HENT:     Nose: Nose normal.     Mouth/Throat:     Mouth: Mucous membranes are moist.     Pharynx: No oropharyngeal exudate or posterior oropharyngeal erythema.  Eyes:     General: No scleral icterus.    Conjunctiva/sclera: Conjunctivae normal.  Neck:     Musculoskeletal: Normal range of motion and neck supple. No muscular tenderness.  Cardiovascular:     Rate and Rhythm: Normal rate and regular rhythm.     Heart sounds: No murmur. No gallop.   Pulmonary:     Effort: Pulmonary effort is normal. No respiratory distress.  Breath sounds: No stridor. No wheezing, rhonchi or rales.  Abdominal:     General: Abdomen is flat. There is no distension.     Palpations: There is no hepatomegaly, splenomegaly or mass.     Tenderness: There is no abdominal tenderness.  Musculoskeletal: Normal range of motion.        General: No swelling.     Right lower leg: No edema.     Left lower leg: No edema.  Lymphadenopathy:     Cervical: No cervical adenopathy.  Skin:    General: Skin is warm and dry.  Neurological:     General: No  focal deficit present.     Cranial Nerves: Cranial nerves are intact.     Sensory: Sensation is intact.     Coordination: Romberg sign positive. Coordination abnormal.     Gait: Gait abnormal.  Psychiatric:        Attention and Perception: Perception normal. She is inattentive.        Mood and Affect: Mood normal.        Speech: Speech normal.        Behavior: Behavior normal. Behavior is cooperative.        Thought Content: Thought content normal.        Cognition and Memory: Cognition is impaired. Memory is impaired. She exhibits impaired recent memory and impaired remote memory.     Lab Results  Component Value Date   WBC 9.9 03/14/2019   HGB 15.3 (H) 03/14/2019   HCT 44.6 03/14/2019   PLT 198.0 03/14/2019   GLUCOSE 119 (H) 03/14/2019   CHOL 263 (H) 01/26/2017   TRIG 114.0 01/26/2017   HDL 51.90 01/26/2017   LDLCALC 189 (H) 01/26/2017   ALT 33 03/14/2019   AST 25 03/14/2019   NA 139 03/14/2019   K 3.8 03/14/2019   CL 106 03/14/2019   CREATININE 0.92 03/14/2019   BUN 18 03/14/2019   CO2 25 03/14/2019   TSH 12.58 (H) 03/14/2019   INR 1.0 05/16/2009   HGBA1C 6.6 (H) 03/14/2019   MICROALBUR 10 06/13/2018    Mr Brain Wo Contrast  Result Date: 12/20/2018 CLINICAL DATA:  Falling over the last week.  Confusion and weakness. EXAM: MRI HEAD WITHOUT CONTRAST TECHNIQUE: Multiplanar, multiecho pulse sequences of the brain and surrounding structures were obtained without intravenous contrast. COMPARISON:  07/11/2013.  01/13/2012. FINDINGS: Brain: Diffusion imaging does not show any acute or subacute infarction. There chronic small-vessel ischemic changes of the pons. No focal cerebellar insult. Cerebral hemispheres show old small vessel infarctions in the left lateral thalamus and left basal ganglia. There is an old right posterior frontal cortical and subcortical infarction. There are mild chronic small-vessel ischemic changes of the deep white matter. There is some hemosiderin  deposition in the region of the old infarctions in the left thalamus and basal ganglia. There could be a developmental venous anomaly in that region. No sign of mass, acute hemorrhage, hydrocephalus or extra-axial collection. Vascular: Major vessels at the base of the brain show flow. Skull and upper cervical spine: Chronic arthropathy at the C1-2 articulation but without encroachment upon the spinal canal. Sinuses/Orbits: Clear/normal Other: None IMPRESSION: No acute or reversible finding. Chronic small-vessel ischemic changes of the pons and cerebral hemispheric white matter. 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 I wonder if there is a developmental venous anomaly in this region as well. If so, this is obviously lifelong and would not be specifically  treatable in any way. Electronically Signed   By: Nelson Chimes M.D.   On: 12/20/2018 08:01    Assessment & Plan:   Elaynah was seen today for hypothyroidism, diabetes and hyperlipidemia.  Diagnoses and all orders for this visit:  Acquired hypothyroidism- Her TSH is elevated and she is symptomatic.  I have asked her to be compliant with the levothyroxine and to increase the dose to 75 mcg a day. -     levothyroxine (SYNTHROID) 75 MCG tablet; Take 1 tablet (75 mcg total) by mouth daily.  Type II diabetes mellitus with manifestations ([place])- Her A1c is 6.6%.  Her blood sugars are adequately well controlled. -     HM Diabetes Foot Exam -     losartan (COZAAR) 50 MG tablet; TAKE ONE TABLET EACH DAY  Essential hypertension- Her blood pressure is adequately well controlled. -     losartan (COZAAR) 50 MG tablet; TAKE ONE TABLET EACH DAY  Cerebellar ataxia in diseases classified elsewhere [facility])- Screening for syphilis is negative. A recent MRI showed that her symptoms are related to small vessel disease.  Will continue to work on risk factor modifications. -     RPR; Future  Sensorineural  hearing loss (SNHL) of both ears -     RPR; Future   I have discontinued Emmalie I. Alberts's levothyroxine. I am also having her start on levothyroxine. Additionally, I am having her maintain her Ascorbic Acid (VITAMIN C PO), Cholecalciferol (VITAMIN D PO), traMADol, amLODipine, triamterene-hydrochlorothiazide, rosuvastatin, potassium chloride, and losartan.  Meds ordered this encounter  Medications  . levothyroxine (SYNTHROID) 75 MCG tablet    Sig: Take 1 tablet (75 mcg total) by mouth daily.    Dispense:  90 tablet    Refill:  0  . losartan (COZAAR) 50 MG tablet    Sig: TAKE ONE TABLET EACH DAY    Dispense:  90 tablet    Refill:  1     Follow-up: No follow-ups on file.  Scarlette Calico, MD

## 2019-03-15 ENCOUNTER — Encounter: Payer: Self-pay | Admitting: Internal Medicine

## 2019-03-15 NOTE — Patient Instructions (Signed)
Hypothyroidism  Hypothyroidism is when the thyroid gland does not make enough of certain hormones (it is underactive). The thyroid gland is a small gland located in the lower front part of the neck, just in front of the windpipe (trachea). This gland makes hormones that help control how the body uses food for energy (metabolism) as well as how the heart and brain function. These hormones also play a role in keeping your bones strong. When the thyroid is underactive, it produces too little of the hormones thyroxine (T4) and triiodothyronine (T3). What are the causes? This condition may be caused by:  Hashimoto's disease. This is a disease in which the body's disease-fighting system (immune system) attacks the thyroid gland. This is the most common cause.  Viral infections.  Pregnancy.  Certain medicines.  Birth defects.  Past radiation treatments to the head or neck for cancer.  Past treatment with radioactive iodine.  Past exposure to radiation in the environment.  Past surgical removal of part or all of the thyroid.  Problems with a gland in the center of the brain (pituitary gland).  Lack of enough iodine in the diet. What increases the risk? You are more likely to develop this condition if:  You are female.  You have a family history of thyroid conditions.  You use a medicine called lithium.  You take medicines that affect the immune system (immunosuppressants). What are the signs or symptoms? Symptoms of this condition include:  Feeling as though you have no energy (lethargy).  Not being able to tolerate cold.  Weight gain that is not explained by a change in diet or exercise habits.  Lack of appetite.  Dry skin.  Coarse hair.  Menstrual irregularity.  Slowing of thought processes.  Constipation.  Sadness or depression. How is this diagnosed? This condition may be diagnosed based on:  Your symptoms, your medical history, and a physical exam.  Blood  tests. You may also have imaging tests, such as an ultrasound or MRI. How is this treated? This condition is treated with medicine that replaces the thyroid hormones that your body does not make. After you begin treatment, it may take several weeks for symptoms to go away. Follow these instructions at home:  Take over-the-counter and prescription medicines only as told by your health care provider.  If you start taking any new medicines, tell your health care provider.  Keep all follow-up visits as told by your health care provider. This is important. ? As your condition improves, your dosage of thyroid hormone medicine may change. ? You will need to have blood tests regularly so that your health care provider can monitor your condition. Contact a health care provider if:  Your symptoms do not get better with treatment.  You are taking thyroid replacement medicine and you: ? Sweat a lot. ? Have tremors. ? Feel anxious. ? Lose weight rapidly. ? Cannot tolerate heat. ? Have emotional swings. ? Have diarrhea. ? Feel weak. Get help right away if you have:  Chest pain.  An irregular heartbeat.  A rapid heartbeat.  Difficulty breathing. Summary  Hypothyroidism is when the thyroid gland does not make enough of certain hormones (it is underactive).  When the thyroid is underactive, it produces too little of the hormones thyroxine (T4) and triiodothyronine (T3).  The most common cause is Hashimoto's disease, a disease in which the body's disease-fighting system (immune system) attacks the thyroid gland. The condition can also be caused by viral infections, medicine, pregnancy, or past   radiation treatment to the head or neck.  Symptoms may include weight gain, dry skin, constipation, feeling as though you do not have energy, and not being able to tolerate cold.  This condition is treated with medicine to replace the thyroid hormones that your body does not make. This information  is not intended to replace advice given to you by your health care provider. Make sure you discuss any questions you have with your health care provider. Document Released: 10/12/2005 Document Revised: 09/22/2017 Document Reviewed: 09/22/2017 Elsevier Interactive Patient Education  2019 [employer]  

## 2019-03-16 ENCOUNTER — Telehealth: Payer: Self-pay

## 2019-03-16 LAB — PTH, INTACT AND CALCIUM
Calcium: 10.9 mg/dL — ABNORMAL HIGH (ref 8.6–10.4)
PTH: 31 pg/mL (ref 14–64)

## 2019-03-16 LAB — RPR: RPR Ser Ql: NONREACTIVE

## 2019-03-16 NOTE — Telephone Encounter (Signed)
Called pt and reviewed all of her medications. She stated understanding.

## 2019-03-16 NOTE — Telephone Encounter (Signed)
-----   Message from Michiel Cowboy, RN sent at 03/16/2019  3:22 PM EDT ----- Ms. Pooler LVM for me stating she was uncertain which medicines she was supposed to stop and what medicines she is supposed to start. Looks like Dr. Ronnald Ramp saw her 5/19. Can you call to clarify?  Thanks! Sharee Pimple

## 2019-04-03 ENCOUNTER — Other Ambulatory Visit: Payer: Self-pay | Admitting: Internal Medicine

## 2019-04-03 DIAGNOSIS — E039 Hypothyroidism, unspecified: Secondary | ICD-10-CM

## 2019-05-03 ENCOUNTER — Other Ambulatory Visit: Payer: Self-pay | Admitting: *Deleted

## 2019-05-03 MED ORDER — AMLODIPINE BESYLATE 10 MG PO TABS: 10.0000 mg | ORAL_TABLET | Freq: Every day | ORAL | 1 refills | Status: DC

## 2019-06-13 ENCOUNTER — Other Ambulatory Visit (INDEPENDENT_AMBULATORY_CARE_PROVIDER_SITE_OTHER): Payer: Medicare Other

## 2019-06-13 ENCOUNTER — Ambulatory Visit (INDEPENDENT_AMBULATORY_CARE_PROVIDER_SITE_OTHER): Payer: Medicare Other | Admitting: Internal Medicine

## 2019-06-13 ENCOUNTER — Other Ambulatory Visit: Payer: Self-pay

## 2019-06-13 ENCOUNTER — Encounter: Payer: Self-pay | Admitting: Internal Medicine

## 2019-06-13 VITALS — BP 122/70 | HR 80 | Temp 97.5°F | Ht 62.0 in | Wt 114.0 lb

## 2019-06-13 DIAGNOSIS — E7800 Pure hypercholesterolemia, unspecified: Secondary | ICD-10-CM | POA: Diagnosis not present

## 2019-06-13 DIAGNOSIS — I10 Essential (primary) hypertension: Secondary | ICD-10-CM

## 2019-06-13 DIAGNOSIS — E118 Type 2 diabetes mellitus with unspecified complications: Secondary | ICD-10-CM

## 2019-06-13 DIAGNOSIS — E039 Hypothyroidism, unspecified: Secondary | ICD-10-CM

## 2019-06-13 LAB — URINALYSIS, ROUTINE W REFLEX MICROSCOPIC
Bilirubin Urine: NEGATIVE
Hgb urine dipstick: NEGATIVE
Ketones, ur: NEGATIVE
Leukocytes,Ua: NEGATIVE
Nitrite: NEGATIVE
RBC / HPF: NONE SEEN (ref 0–?)
Specific Gravity, Urine: 1.01 (ref 1.000–1.030)
Total Protein, Urine: NEGATIVE
Urine Glucose: NEGATIVE
Urobilinogen, UA: 0.2 (ref 0.0–1.0)
pH: 7 (ref 5.0–8.0)

## 2019-06-13 LAB — CBC WITH DIFFERENTIAL/PLATELET
Basophils Absolute: 0.1 10*3/uL (ref 0.0–0.1)
Basophils Relative: 0.8 % (ref 0.0–3.0)
Eosinophils Absolute: 0.1 10*3/uL (ref 0.0–0.7)
Eosinophils Relative: 0.8 % (ref 0.0–5.0)
HCT: 40.5 % (ref 36.0–46.0)
Hemoglobin: 13.6 g/dL (ref 12.0–15.0)
Lymphocytes Relative: 34.4 % (ref 12.0–46.0)
Lymphs Abs: 3.2 10*3/uL (ref 0.7–4.0)
MCHC: 33.7 g/dL (ref 30.0–36.0)
MCV: 91.9 fl (ref 78.0–100.0)
Monocytes Absolute: 0.7 10*3/uL (ref 0.1–1.0)
Monocytes Relative: 7.7 % (ref 3.0–12.0)
Neutro Abs: 5.3 10*3/uL (ref 1.4–7.7)
Neutrophils Relative %: 56.3 % (ref 43.0–77.0)
Platelets: 304 10*3/uL (ref 150.0–400.0)
RBC: 4.41 Mil/uL (ref 3.87–5.11)
RDW: 12.9 % (ref 11.5–15.5)
WBC: 9.3 10*3/uL (ref 4.0–10.5)

## 2019-06-13 LAB — LIPID PANEL
Cholesterol: 126 mg/dL (ref 0–200)
HDL: 39.6 mg/dL (ref >39.00)
LDL Cholesterol: 65 mg/dL (ref 0–99)
NonHDL: 86.53
Total CHOL/HDL Ratio: 3
Triglycerides: 106 mg/dL (ref 0.0–149.0)
VLDL: 21.2 mg/dL (ref 0.0–40.0)

## 2019-06-13 LAB — BASIC METABOLIC PANEL
BUN: 17 mg/dL (ref 6–23)
CO2: 24 mEq/L (ref 19–32)
Calcium: 10.5 mg/dL (ref 8.4–10.5)
Chloride: 105 mEq/L (ref 96–112)
Creatinine, Ser: 0.96 mg/dL (ref 0.40–1.20)
GFR: 67.01 mL/min (ref >60.00)
Glucose, Bld: 108 mg/dL — ABNORMAL HIGH (ref 70–99)
Potassium: 3.5 mEq/L (ref 3.5–5.1)
Sodium: 137 mEq/L (ref 135–145)

## 2019-06-13 LAB — TSH: TSH: 0.01 u[IU]/mL — ABNORMAL LOW (ref 0.35–4.50)

## 2019-06-13 LAB — MICROALBUMIN / CREATININE URINE RATIO
Creatinine,U: 113.9 mg/dL
Microalb Creat Ratio: 0.8 mg/g (ref 0.0–30.0)
Microalb, Ur: 0.9 mg/dL (ref 0.0–1.9)

## 2019-06-13 LAB — HEMOGLOBIN A1C: Hgb A1c MFr Bld: 6 % (ref 4.6–6.5)

## 2019-06-13 MED ORDER — LOSARTAN POTASSIUM 50 MG PO TABS: ORAL_TABLET | ORAL | 1 refills | Status: DC

## 2019-06-13 MED ORDER — LEVOTHYROXINE SODIUM 50 MCG PO TABS: 50.0000 ug | ORAL_TABLET | Freq: Every day | ORAL | 0 refills | Status: DC

## 2019-06-13 NOTE — Patient Instructions (Signed)
Hypothyroidism  Hypothyroidism is when the thyroid gland does not make enough of certain hormones (it is underactive). The thyroid gland is a small gland located in the lower front part of the neck, just in front of the windpipe (trachea). This gland makes hormones that help control how the body uses food for energy (metabolism) as well as how the heart and brain function. These hormones also play a role in keeping your bones strong. When the thyroid is underactive, it produces too little of the hormones thyroxine (T4) and triiodothyronine (T3). What are the causes? This condition may be caused by:  Hashimoto's disease. This is a disease in which the body's disease-fighting system (immune system) attacks the thyroid gland. This is the most common cause.  Viral infections.  Pregnancy.  Certain medicines.  Birth defects.  Past radiation treatments to the head or neck for cancer.  Past treatment with radioactive iodine.  Past exposure to radiation in the environment.  Past surgical removal of part or all of the thyroid.  Problems with a gland in the center of the brain (pituitary gland).  Lack of enough iodine in the diet. What increases the risk? You are more likely to develop this condition if:  You are female.  You have a family history of thyroid conditions.  You use a medicine called lithium.  You take medicines that affect the immune system (immunosuppressants). What are the signs or symptoms? Symptoms of this condition include:  Feeling as though you have no energy (lethargy).  Not being able to tolerate cold.  Weight gain that is not explained by a change in diet or exercise habits.  Lack of appetite.  Dry skin.  Coarse hair.  Menstrual irregularity.  Slowing of thought processes.  Constipation.  Sadness or depression. How is this diagnosed? This condition may be diagnosed based on:  Your symptoms, your medical history, and a physical exam.  Blood  tests. You may also have imaging tests, such as an ultrasound or MRI. How is this treated? This condition is treated with medicine that replaces the thyroid hormones that your body does not make. After you begin treatment, it may take several weeks for symptoms to go away. Follow these instructions at home:  Take over-the-counter and prescription medicines only as told by your health care provider.  If you start taking any new medicines, tell your health care provider.  Keep all follow-up visits as told by your health care provider. This is important. ? As your condition improves, your dosage of thyroid hormone medicine may change. ? You will need to have blood tests regularly so that your health care provider can monitor your condition. Contact a health care provider if:  Your symptoms do not get better with treatment.  You are taking thyroid replacement medicine and you: ? Sweat a lot. ? Have tremors. ? Feel anxious. ? Lose weight rapidly. ? Cannot tolerate heat. ? Have emotional swings. ? Have diarrhea. ? Feel weak. Get help right away if you have:  Chest pain.  An irregular heartbeat.  A rapid heartbeat.  Difficulty breathing. Summary  Hypothyroidism is when the thyroid gland does not make enough of certain hormones (it is underactive).  When the thyroid is underactive, it produces too little of the hormones thyroxine (T4) and triiodothyronine (T3).  The most common cause is Hashimoto's disease, a disease in which the body's disease-fighting system (immune system) attacks the thyroid gland. The condition can also be caused by viral infections, medicine, pregnancy, or past   radiation treatment to the head or neck.  Symptoms may include weight gain, dry skin, constipation, feeling as though you do not have energy, and not being able to tolerate cold.  This condition is treated with medicine to replace the thyroid hormones that your body does not make. This information  is not intended to replace advice given to you by your health care provider. Make sure you discuss any questions you have with your health care provider. Document Released: 10/12/2005 Document Revised: 09/24/2017 Document Reviewed: 09/22/2017 Elsevier Patient Education  2020 [employer]  

## 2019-06-13 NOTE — Progress Notes (Signed)
Subjective:  Patient ID: Jackie Horton, female    DOB: 21-Jan-1933  Age: 85 y.o. MRN: [id number]  CC: Hypertension, Hypothyroidism, Diabetes, and Hyperlipidemia   HPI Jackie Horton presents for f/up - She continues to complain of fatigue but offers no other complaints.  Outpatient Medications Prior to Visit  Medication Sig Dispense Refill   amLODipine (NORVASC) 10 MG tablet Take 1 tablet (10 mg total) by mouth daily. 90 tablet 1   Ascorbic Acid (VITAMIN C PO) Take 1 tablet by mouth daily.     Cholecalciferol (VITAMIN D PO) Take 1 tablet by mouth daily.     potassium chloride (K-DUR) 10 MEQ tablet TAKE ONE TABLET DAILY 90 tablet 1   rosuvastatin (CRESTOR) 10 MG tablet TAKE ONE TABLET EACH DAY 90 tablet 1   triamterene-hydrochlorothiazide (MAXZIDE-25) 37.5-25 MG tablet Take 0.5 tablets by mouth daily. 135 tablet 1   levothyroxine (SYNTHROID) 75 MCG tablet Take 1 tablet (75 mcg total) by mouth daily. 90 tablet 0   losartan (COZAAR) 50 MG tablet TAKE ONE TABLET EACH DAY 90 tablet 1   No facility-administered medications prior to visit.     ROS Review of Systems  Constitutional: Negative for appetite change, diaphoresis, fatigue and unexpected weight change.  HENT: Negative.   Eyes: Negative for visual disturbance.  Respiratory: Negative.  Negative for cough, chest tightness and wheezing.   Cardiovascular: Negative for chest pain, palpitations and leg swelling.  Gastrointestinal: Negative for abdominal pain, constipation, diarrhea, nausea and vomiting.  Endocrine: Negative.  Negative for cold intolerance and heat intolerance.  Genitourinary: Negative.  Negative for difficulty urinating.  Musculoskeletal: Negative for arthralgias and myalgias.  Skin: Negative.  Negative for pallor.  Neurological: Negative.  Negative for dizziness, weakness and light-headedness.  Hematological: Negative for adenopathy. Does not bruise/bleed easily.  Psychiatric/Behavioral: Negative.      Objective:  BP 122/70 (BP Location: Left Arm, Patient Position: Sitting, Cuff Size: Normal)    Pulse 80    Temp (!) 97.5 F (36.4 C) (Oral)    Ht 5\' 2"  (1.575 m)    Wt 114 lb (51.7 kg)    SpO2 98%    BMI 20.85 kg/m   BP Readings from Last 3 Encounters:  06/13/19 122/70  03/14/19 130/80  12/13/18 (!) 148/86    Wt Readings from Last 3 Encounters:  06/13/19 114 lb (51.7 kg)  03/14/19 112 lb (50.8 kg)  12/13/18 113 lb (51.3 kg)    Physical Exam Vitals signs reviewed.  Constitutional:      Appearance: She is not ill-appearing or diaphoretic.  HENT:     Nose: Nose normal.     Mouth/Throat:     Mouth: Mucous membranes are moist.  Eyes:     General: No scleral icterus.    Conjunctiva/sclera: Conjunctivae normal.  Neck:     Musculoskeletal: Normal range of motion. No neck rigidity or muscular tenderness.  Cardiovascular:     Rate and Rhythm: Normal rate and regular rhythm.     Heart sounds: No murmur.  Pulmonary:     Effort: Pulmonary effort is normal. No respiratory distress.     Breath sounds: No stridor. No wheezing, rhonchi or rales.  Abdominal:     General: Abdomen is protuberant. Bowel sounds are normal.     Palpations: There is no hepatomegaly, splenomegaly or mass.     Tenderness: There is no abdominal tenderness. There is no guarding.  Musculoskeletal: Normal range of motion.     Right lower leg: No  edema.  Lymphadenopathy:     Cervical: No cervical adenopathy.  Skin:    General: Skin is warm and dry.     Coloration: Skin is not pale.  Neurological:     General: No focal deficit present.     Mental Status: She is oriented to person, place, and time. Mental status is at baseline.     Lab Results  Component Value Date   WBC 9.3 06/13/2019   HGB 13.6 06/13/2019   HCT 40.5 06/13/2019   PLT 304.0 06/13/2019   GLUCOSE 108 (H) 06/13/2019   CHOL 126 06/13/2019   TRIG 106.0 06/13/2019   HDL 39.60 06/13/2019   LDLCALC 65 06/13/2019   ALT 33 03/14/2019    AST 25 03/14/2019   NA 137 06/13/2019   K 3.5 06/13/2019   CL 105 06/13/2019   CREATININE 0.96 06/13/2019   BUN 17 06/13/2019   CO2 24 06/13/2019   TSH 0.01 Repeated and verified X2. (L) 06/13/2019   INR 1.0 05/16/2009   HGBA1C 6.0 06/13/2019   MICROALBUR 0.9 06/13/2019    Mr Brain Wo Contrast  Result Date: 12/20/2018 CLINICAL DATA:  Falling over the last week.  Confusion and weakness. EXAM: MRI HEAD WITHOUT CONTRAST TECHNIQUE: Multiplanar, multiecho pulse sequences of the brain and surrounding structures were obtained without intravenous contrast. COMPARISON:  07/11/2013.  01/13/2012. FINDINGS: Brain: Diffusion imaging does not show any acute or subacute infarction. There chronic small-vessel ischemic changes of the pons. No focal cerebellar insult. Cerebral hemispheres show old small vessel infarctions in the left lateral thalamus and left basal ganglia. There is an old right posterior frontal cortical and subcortical infarction. There are mild chronic small-vessel ischemic changes of the deep white matter. There is some hemosiderin deposition in the region of the old infarctions in the left thalamus and basal ganglia. There could be a developmental venous anomaly in that region. No sign of mass, acute hemorrhage, hydrocephalus or extra-axial collection. Vascular: Major vessels at the base of the brain show flow. Skull and upper cervical spine: Chronic arthropathy at the C1-2 articulation but without encroachment upon the spinal canal. Sinuses/Orbits: Clear/normal Other: None IMPRESSION: No acute or reversible finding. Chronic small-vessel ischemic changes of the pons and cerebral hemispheric white matter. 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 I wonder if there is a developmental venous anomaly in this region as well. If so, this is obviously lifelong and would not be specifically treatable in any way. Electronically Signed   By: Nelson Chimes M.D.   On: 12/20/2018 08:01    Assessment & Plan:   Jackie Horton was seen today for hypertension, hypothyroidism, diabetes and hyperlipidemia.  Diagnoses and all orders for this visit:  Acquired hypothyroidism- Her TSH is suppressed so I have asked her to lower her levothyroxine dose. -     levothyroxine (SYNTHROID) 50 MCG tablet; Take 1 tablet (50 mcg total) by mouth daily.  Essential hypertension- Her blood pressure is adequately well controlled. -     losartan (COZAAR) 50 MG tablet; TAKE ONE TABLET EACH DAY -     CBC with Differential/Platelet; Future -     Basic metabolic panel; Future -     Hemoglobin A1c; Future -     Urinalysis, Routine w reflex microscopic; Future  Type II diabetes mellitus with manifestations ([place])- Her A1c is at 6.0%.  Medical therapy is not indicated. -     losartan (COZAAR) 50 MG tablet; TAKE ONE TABLET  EACH DAY -     Basic metabolic panel; Future -     Microalbumin / creatinine urine ratio; Future  Hypercholesteremia- She has achieved her LDL goal and is doing well on the statin. -     Lipid panel; Future -     TSH; Future   I have discontinued Jackie Horton's levothyroxine. I am also having her start on levothyroxine. Additionally, I am having her maintain her Ascorbic Acid (VITAMIN C PO), Cholecalciferol (VITAMIN D PO), triamterene-hydrochlorothiazide, rosuvastatin, potassium chloride, amLODipine, and losartan.  Meds ordered this encounter  Medications   losartan (COZAAR) 50 MG tablet    Sig: TAKE ONE TABLET EACH DAY    Dispense:  90 tablet    Refill:  1   levothyroxine (SYNTHROID) 50 MCG tablet    Sig: Take 1 tablet (50 mcg total) by mouth daily.    Dispense:  90 tablet    Refill:  0     Follow-up: Return in about 4 months (around 10/13/2019).  Scarlette Calico, MD

## 2019-07-13 ENCOUNTER — Other Ambulatory Visit: Payer: Self-pay | Admitting: Internal Medicine

## 2019-08-07 ENCOUNTER — Other Ambulatory Visit: Payer: Self-pay | Admitting: Internal Medicine

## 2019-08-07 DIAGNOSIS — E7800 Pure hypercholesterolemia, unspecified: Secondary | ICD-10-CM

## 2019-08-08 ENCOUNTER — Other Ambulatory Visit: Payer: Self-pay

## 2019-08-08 NOTE — Patient Outreach (Signed)
[facility]) Care Management  08/08/2019  Jackie Horton 21-Oct-1933 [id number]   Medication Adherence call to Jackie Horton [facility] Identifiers Verify spoke with patient she is past due on Rosuvastatin 10 mg patient explain she takes her medication on a regular basis and his son has pick up this medication from the pharmacy patient explain she has plenty and will order when due. [name] is showing past due under [place].   [facility] Direct Dial [phone]  Fax [phone] [email or web address]

## 2019-09-12 ENCOUNTER — Other Ambulatory Visit: Payer: Self-pay

## 2019-09-12 ENCOUNTER — Encounter: Payer: Self-pay | Admitting: Internal Medicine

## 2019-09-12 ENCOUNTER — Ambulatory Visit (INDEPENDENT_AMBULATORY_CARE_PROVIDER_SITE_OTHER): Payer: Medicare Other | Admitting: Internal Medicine

## 2019-09-12 ENCOUNTER — Other Ambulatory Visit: Payer: Self-pay | Admitting: Internal Medicine

## 2019-09-12 VITALS — BP 130/80 | HR 80 | Temp 98.4°F | Resp 16 | Ht 62.0 in | Wt 118.0 lb

## 2019-09-12 DIAGNOSIS — E039 Hypothyroidism, unspecified: Secondary | ICD-10-CM

## 2019-09-12 DIAGNOSIS — Z23 Encounter for immunization: Secondary | ICD-10-CM

## 2019-09-12 DIAGNOSIS — M19012 Primary osteoarthritis, left shoulder: Secondary | ICD-10-CM

## 2019-09-12 DIAGNOSIS — M19011 Primary osteoarthritis, right shoulder: Secondary | ICD-10-CM | POA: Diagnosis not present

## 2019-09-12 MED ORDER — MELOXICAM 7.5 MG PO TABS: 7.5000 mg | ORAL_TABLET | Freq: Every day | ORAL | 1 refills | Status: DC

## 2019-09-12 NOTE — Progress Notes (Signed)
Subjective:  Patient ID: Jackie Horton, female    DOB: 17-May-1933  Age: 85 y.o. MRN: [id number]  CC: Osteoarthritis and Hypothyroidism   HPI ERIENNE SPELMAN presents for f/up - She complains of a several month history of worsening bilateral shoulder pain worse on the left than the right.  She denies any history of trauma.  She also complains of a gradual decrease in the range of motion.  She has not taken anything to control the pain.  Outpatient Medications Prior to Visit  Medication Sig Dispense Refill   amLODipine (NORVASC) 10 MG tablet Take 1 tablet (10 mg total) by mouth daily. 90 tablet 1   Ascorbic Acid (VITAMIN C PO) Take 1 tablet by mouth daily.     Cholecalciferol (VITAMIN D PO) Take 1 tablet by mouth daily.     losartan (COZAAR) 50 MG tablet TAKE ONE TABLET EACH DAY 90 tablet 1   potassium chloride (K-DUR) 10 MEQ tablet TAKE ONE TABLET EACH DAY 90 tablet 1   rosuvastatin (CRESTOR) 10 MG tablet TAKE ONE TABLET EACH DAY 90 tablet 1   triamterene-hydrochlorothiazide (MAXZIDE-25) 37.5-25 MG tablet Take 0.5 tablets by mouth daily. 135 tablet 1   levothyroxine (SYNTHROID) 50 MCG tablet Take 1 tablet (50 mcg total) by mouth daily. 90 tablet 0   No facility-administered medications prior to visit.     ROS Review of Systems  Constitutional: Negative for chills, fatigue and fever.  HENT: Negative.   Eyes: Negative.   Respiratory: Negative for cough, chest tightness, shortness of breath and wheezing.   Cardiovascular: Negative for chest pain, palpitations and leg swelling.  Gastrointestinal: Negative for abdominal pain, constipation, diarrhea, nausea and vomiting.  Endocrine: Negative.  Negative for cold intolerance and heat intolerance.  Genitourinary: Negative.   Musculoskeletal: Positive for arthralgias. Negative for neck pain.  Skin: Negative.   Neurological: Negative.  Negative for dizziness, weakness and light-headedness.  Hematological: Negative for  adenopathy. Does not bruise/bleed easily.    Objective:  BP 130/80 (BP Location: Left Arm, Patient Position: Sitting, Cuff Size: Normal)    Pulse 80    Temp 98.4 F (36.9 C) (Oral)    Resp 16    Ht 5\' 2"  (1.575 m)    Wt 118 lb (53.5 kg)    SpO2 99%    BMI 21.58 kg/m   BP Readings from Last 3 Encounters:  09/12/19 130/80  06/13/19 122/70  03/14/19 130/80    Wt Readings from Last 3 Encounters:  09/12/19 118 lb (53.5 kg)  06/13/19 114 lb (51.7 kg)  03/14/19 112 lb (50.8 kg)    Physical Exam Vitals signs reviewed.  Constitutional:      Appearance: Normal appearance.  HENT:     Mouth/Throat:     Mouth: Mucous membranes are moist.  Eyes:     General: No scleral icterus.    Conjunctiva/sclera: Conjunctivae normal.  Neck:     Musculoskeletal: Neck supple.  Cardiovascular:     Rate and Rhythm: Normal rate and regular rhythm.     Heart sounds: No murmur.  Pulmonary:     Effort: Pulmonary effort is normal.     Breath sounds: No stridor. No wheezing, rhonchi or rales.  Abdominal:     General: Abdomen is flat. Bowel sounds are normal. There is no distension.     Palpations: Abdomen is soft. There is no hepatomegaly or splenomegaly.  Musculoskeletal: Normal range of motion.     Right shoulder: She exhibits pain. She exhibits no  tenderness, no bony tenderness, no swelling and no deformity.     Left shoulder: She exhibits pain. She exhibits no tenderness, no bony tenderness and no deformity.  Lymphadenopathy:     Cervical: No cervical adenopathy.  Skin:    General: Skin is warm and dry.  Neurological:     General: No focal deficit present.     Mental Status: She is alert.  Psychiatric:        Mood and Affect: Mood normal.        Behavior: Behavior normal.     Lab Results  Component Value Date   WBC 9.3 06/13/2019   HGB 13.6 06/13/2019   HCT 40.5 06/13/2019   PLT 304.0 06/13/2019   GLUCOSE 108 (H) 06/13/2019   CHOL 126 06/13/2019   TRIG 106.0 06/13/2019   HDL 39.60  06/13/2019   LDLCALC 65 06/13/2019   ALT 33 03/14/2019   AST 25 03/14/2019   NA 137 06/13/2019   K 3.5 06/13/2019   CL 105 06/13/2019   CREATININE 0.96 06/13/2019   BUN 17 06/13/2019   CO2 24 06/13/2019   TSH 0.01 Repeated and verified X2. (L) 06/13/2019   INR 1.0 05/16/2009   HGBA1C 6.0 06/13/2019   MICROALBUR 0.9 06/13/2019    Mr Brain Wo Contrast  Result Date: 12/20/2018 CLINICAL DATA:  Falling over the last week.  Confusion and weakness. EXAM: MRI HEAD WITHOUT CONTRAST TECHNIQUE: Multiplanar, multiecho pulse sequences of the brain and surrounding structures were obtained without intravenous contrast. COMPARISON:  07/11/2013.  01/13/2012. FINDINGS: Brain: Diffusion imaging does not show any acute or subacute infarction. There chronic small-vessel ischemic changes of the pons. No focal cerebellar insult. Cerebral hemispheres show old small vessel infarctions in the left lateral thalamus and left basal ganglia. There is an old right posterior frontal cortical and subcortical infarction. There are mild chronic small-vessel ischemic changes of the deep white matter. There is some hemosiderin deposition in the region of the old infarctions in the left thalamus and basal ganglia. There could be a developmental venous anomaly in that region. No sign of mass, acute hemorrhage, hydrocephalus or extra-axial collection. Vascular: Major vessels at the base of the brain show flow. Skull and upper cervical spine: Chronic arthropathy at the C1-2 articulation but without encroachment upon the spinal canal. Sinuses/Orbits: Clear/normal Other: None IMPRESSION: No acute or reversible finding. Chronic small-vessel ischemic changes of the pons and cerebral hemispheric white matter. 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 I wonder if there is a developmental venous anomaly in this region as well. If so, this is obviously lifelong and would not be  specifically treatable in any way. Electronically Signed   By: Nelson Chimes M.D.   On: 12/20/2018 08:01    Assessment & Plan:   Jessalyn was seen today for osteoarthritis and hypothyroidism.  Diagnoses and all orders for this visit:  Acquired hypothyroidism- I will recheck her TSH and will adjust her dose if indicated. -     TSH; Future  Primary osteoarthritis of both shoulders- Symptoms and exam are consistent with OA.  I recommended that she start taking a once daily anti-inflammatory. -     meloxicam (MOBIC) 7.5 MG tablet; Take 1 tablet (7.5 mg total) by mouth daily.  Need for influenza vaccination -     Flu Vaccine QUAD High Dose(Fluad)   I am having Oni I. Ching start on meloxicam. I am also having her maintain her Ascorbic Acid (  VITAMIN C PO), Cholecalciferol (VITAMIN D PO), triamterene-hydrochlorothiazide, amLODipine, losartan, potassium chloride, and rosuvastatin.  Meds ordered this encounter  Medications   meloxicam (MOBIC) 7.5 MG tablet    Sig: Take 1 tablet (7.5 mg total) by mouth daily.    Dispense:  90 tablet    Refill:  1     Follow-up: Return in about 4 months (around 01/10/2020).  Scarlette Calico, MD

## 2019-09-12 NOTE — Patient Instructions (Signed)
Shoulder Pain Many things can cause shoulder pain, including:  An injury to the shoulder.  Overuse of the shoulder.  Arthritis. The source of the pain can be:  Inflammation.  An injury to the shoulder joint.  An injury to a tendon, ligament, or bone. Follow these instructions at home: Pay attention to changes in your symptoms. Let your health care provider know about them. Follow these instructions to relieve your pain. If you have a sling:  Wear the sling as told by your health care provider. Remove it only as told by your health care provider.  Loosen the sling if your fingers tingle, become numb, or turn cold and blue.  Keep the sling clean.  If the sling is not waterproof: ? Do not let it get wet. Remove it to shower or bathe.  Move your arm as little as possible, but keep your hand moving to prevent swelling. Managing pain, stiffness, and swelling   If directed, put ice on the painful area: ? Put ice in a plastic bag. ? Place a towel between your skin and the bag. ? Leave the ice on for 20 minutes, 2-3 times per day. Stop applying ice if it does not help with the pain.  Squeeze a soft ball or a foam pad as much as possible. This helps to keep the shoulder from swelling. It also helps to strengthen the arm. General instructions  Take over-the-counter and prescription medicines only as told by your health care provider.  Keep all follow-up visits as told by your health care provider. This is important. Contact a health care provider if:  Your pain gets worse.  Your pain is not relieved with medicines.  New pain develops in your arm, hand, or fingers. Get help right away if:  Your arm, hand, or fingers: ? Tingle. ? Become numb. ? Become swollen. ? Become painful. ? Turn white or blue. Summary  Shoulder pain can be caused by an injury, overuse, or arthritis.  Pay attention to changes in your symptoms. Let your health care provider know about them.   This condition may be treated with a sling, ice, and pain medicines.  Contact your health care provider if the pain gets worse or new pain develops. Get help right away if your arm, hand, or fingers tingle or become numb, swollen, or painful.  Keep all follow-up visits as told by your health care provider. This is important. This information is not intended to replace advice given to you by your health care provider. Make sure you discuss any questions you have with your health care provider. Document Released: 07/22/2005 Document Revised: 04/26/2018 Document Reviewed: 04/26/2018 Elsevier Patient Education  2020 [employer]  

## 2019-09-26 DIAGNOSIS — Z853 Personal history of malignant neoplasm of breast: Secondary | ICD-10-CM | POA: Diagnosis not present

## 2019-09-26 DIAGNOSIS — Z1231 Encounter for screening mammogram for malignant neoplasm of breast: Secondary | ICD-10-CM | POA: Diagnosis not present

## 2019-10-30 ENCOUNTER — Telehealth: Payer: Self-pay

## 2019-10-30 NOTE — Telephone Encounter (Signed)
Spoke to pt dtr, did not give any information regarding pt. Informed that we do not have the COVID vaccine at this time.

## 2019-10-30 NOTE — Telephone Encounter (Signed)
Copied from [place] [id number]. Topic: General - Other >> Oct 30, 2019  2:57 PM Keene Breath wrote: Reason for CRM: Patient's daughter requests a call regarding the COVID vaccine.  CB# [phone]

## 2019-11-21 ENCOUNTER — Other Ambulatory Visit: Payer: Self-pay | Admitting: Internal Medicine

## 2019-12-19 ENCOUNTER — Other Ambulatory Visit: Payer: Self-pay | Admitting: Internal Medicine

## 2019-12-19 DIAGNOSIS — E039 Hypothyroidism, unspecified: Secondary | ICD-10-CM

## 2020-01-10 ENCOUNTER — Encounter: Payer: Self-pay | Admitting: Internal Medicine

## 2020-01-10 ENCOUNTER — Ambulatory Visit (INDEPENDENT_AMBULATORY_CARE_PROVIDER_SITE_OTHER): Payer: Medicare Other | Admitting: Internal Medicine

## 2020-01-10 ENCOUNTER — Other Ambulatory Visit: Payer: Self-pay

## 2020-01-10 VITALS — BP 132/80 | HR 97 | Temp 97.9°F | Resp 16 | Ht 62.0 in | Wt 117.0 lb

## 2020-01-10 DIAGNOSIS — Z0000 Encounter for general adult medical examination without abnormal findings: Secondary | ICD-10-CM

## 2020-01-10 DIAGNOSIS — I10 Essential (primary) hypertension: Secondary | ICD-10-CM | POA: Diagnosis not present

## 2020-01-10 DIAGNOSIS — E118 Type 2 diabetes mellitus with unspecified complications: Secondary | ICD-10-CM

## 2020-01-10 DIAGNOSIS — E039 Hypothyroidism, unspecified: Secondary | ICD-10-CM

## 2020-01-10 LAB — BASIC METABOLIC PANEL
BUN: 17 mg/dL (ref 6–23)
CO2: 22 mEq/L (ref 19–32)
Calcium: 10.2 mg/dL (ref 8.4–10.5)
Chloride: 109 mEq/L (ref 96–112)
Creatinine, Ser: 0.85 mg/dL (ref 0.40–1.20)
GFR: 77 mL/min (ref >60.00)
Glucose, Bld: 110 mg/dL — ABNORMAL HIGH (ref 70–99)
Potassium: 3.5 mEq/L (ref 3.5–5.1)
Sodium: 139 mEq/L (ref 135–145)

## 2020-01-10 LAB — TSH: TSH: 4.7 u[IU]/mL — ABNORMAL HIGH (ref 0.35–4.50)

## 2020-01-10 LAB — HEMOGLOBIN A1C: Hgb A1c MFr Bld: 5.9 % (ref 4.6–6.5)

## 2020-01-10 NOTE — Patient Instructions (Signed)
Health Maintenance, Female Adopting a healthy lifestyle and getting preventive care are important in promoting health and wellness. Ask your health care provider about:  The right schedule for you to have regular tests and exams.  Things you can do on your own to prevent diseases and keep yourself healthy. What should I know about diet, weight, and exercise? Eat a healthy diet   Eat a diet that includes plenty of vegetables, fruits, low-fat dairy products, and lean protein.  Do not eat a lot of foods that are high in solid fats, added sugars, or sodium. Maintain a healthy weight Body mass index (BMI) is used to identify weight problems. It estimates body fat based on height and weight. Your health care provider can help determine your BMI and help you achieve or maintain a healthy weight. Get regular exercise Get regular exercise. This is one of the most important things you can do for your health. Most adults should:  Exercise for at least 150 minutes each week. The exercise should increase your heart rate and make you sweat (moderate-intensity exercise).  Do strengthening exercises at least twice a week. This is in addition to the moderate-intensity exercise.  Spend less time sitting. Even light physical activity can be beneficial. Watch cholesterol and blood lipids Have your blood tested for lipids and cholesterol at 84 years of age, then have this test every 5 years. Have your cholesterol levels checked more often if:  Your lipid or cholesterol levels are high.  You are older than 84 years of age.  You are at high risk for heart disease. What should I know about cancer screening? Depending on your health history and family history, you may need to have cancer screening at various ages. This may include screening for:  Breast cancer.  Cervical cancer.  Colorectal cancer.  Skin cancer.  Lung cancer. What should I know about heart disease, diabetes, and high blood  pressure? Blood pressure and heart disease  High blood pressure causes heart disease and increases the risk of stroke. This is more likely to develop in people who have high blood pressure readings, are of African descent, or are overweight.  Have your blood pressure checked: ? Every 3-5 years if you are 18-39 years of age. ? Every year if you are 40 years old or older. Diabetes Have regular diabetes screenings. This checks your fasting blood sugar level. Have the screening done:  Once every three years after age 40 if you are at a normal weight and have a low risk for diabetes.  More often and at a younger age if you are overweight or have a high risk for diabetes. What should I know about preventing infection? Hepatitis B If you have a higher risk for hepatitis B, you should be screened for this virus. Talk with your health care provider to find out if you are at risk for hepatitis B infection. Hepatitis C Testing is recommended for:  Everyone born from 1945 through 1965.  Anyone with known risk factors for hepatitis C. Sexually transmitted infections (STIs)  Get screened for STIs, including gonorrhea and chlamydia, if: ? You are sexually active and are younger than 84 years of age. ? You are older than 84 years of age and your health care provider tells you that you are at risk for this type of infection. ? Your sexual activity has changed since you were last screened, and you are at increased risk for chlamydia or gonorrhea. Ask your health care provider if   you are at risk.  Ask your health care provider about whether you are at high risk for HIV. Your health care provider may recommend a prescription medicine to help prevent HIV infection. If you choose to take medicine to prevent HIV, you should first get tested for HIV. You should then be tested every 3 months for as long as you are taking the medicine. Pregnancy  If you are about to stop having your period (premenopausal) and  you may become pregnant, seek counseling before you get pregnant.  Take 400 to 800 micrograms (mcg) of folic acid every day if you become pregnant.  Ask for birth control (contraception) if you want to prevent pregnancy. Osteoporosis and menopause Osteoporosis is a disease in which the bones lose minerals and strength with aging. This can result in bone fractures. If you are 65 years old or older, or if you are at risk for osteoporosis and fractures, ask your health care provider if you should:  Be screened for bone loss.  Take a calcium or vitamin D supplement to lower your risk of fractures.  Be given hormone replacement therapy (HRT) to treat symptoms of menopause. Follow these instructions at home: Lifestyle  Do not use any products that contain nicotine or tobacco, such as cigarettes, e-cigarettes, and chewing tobacco. If you need help quitting, ask your health care provider.  Do not use street drugs.  Do not share needles.  Ask your health care provider for help if you need support or information about quitting drugs. Alcohol use  Do not drink alcohol if: ? Your health care provider tells you not to drink. ? You are pregnant, may be pregnant, or are planning to become pregnant.  If you drink alcohol: ? Limit how much you use to 0-1 drink a day. ? Limit intake if you are breastfeeding.  Be aware of how much alcohol is in your drink. In the [place] one drink equals one 12 oz bottle of beer (355 mL), one 5 oz glass of wine (148 mL), or one 1 oz glass of hard liquor (44 mL). General instructions  Schedule regular health, dental, and eye exams.  Stay current with your vaccines.  Tell your health care provider if: ? You often feel depressed. ? You have ever been abused or do not feel safe at home. Summary  Adopting a healthy lifestyle and getting preventive care are important in promoting health and wellness.  Follow your health care provider's instructions about healthy  diet, exercising, and getting tested or screened for diseases.  Follow your health care provider's instructions on monitoring your cholesterol and blood pressure. This information is not intended to replace advice given to you by your health care provider. Make sure you discuss any questions you have with your health care provider. Document Revised: 10/05/2018 Document Reviewed: 10/05/2018 Elsevier Patient Education  2020 [employer]  

## 2020-01-10 NOTE — Progress Notes (Signed)
Subjective:  Patient ID: Jackie Horton, female    DOB: July 05, 1933  Age: 86 y.o. MRN: [id number]  CC: Hypothyroidism, Hypertension, Diabetes, and Annual Exam  This visit occurred during the SARS-CoV-2 public health emergency.  Safety protocols were in place, including screening questions prior to the visit, additional usage of staff PPE, and extensive cleaning of exam room while observing appropriate contact time as indicated for disinfecting solutions.    HPI MERSEDES ALBER presents for a CPX.  She tells me she has felt well recently and offers no complaints.  She is gaining adequate symptom relief from her musculoskeletal pain with meloxicam.  Her energy level is good and she denies any recent episodes of constipation, edema, or fatigue.  She is tolerating all of her medications well.  Outpatient Medications Prior to Visit  Medication Sig Dispense Refill  . amLODipine (NORVASC) 10 MG tablet TAKE ONE TABLET DAILY 90 tablet 1  . Ascorbic Acid (VITAMIN C PO) Take 1 tablet by mouth daily.    . Cholecalciferol (VITAMIN D PO) Take 1 tablet by mouth daily.    Marland Kitchen levothyroxine (SYNTHROID) 50 MCG tablet TAKE ONE TABLET DAILY 90 tablet 0  . losartan (COZAAR) 50 MG tablet TAKE ONE TABLET EACH DAY 90 tablet 1  . meloxicam (MOBIC) 7.5 MG tablet Take 1 tablet (7.5 mg total) by mouth daily. 90 tablet 1  . potassium chloride (K-DUR) 10 MEQ tablet TAKE ONE TABLET EACH DAY 90 tablet 1  . rosuvastatin (CRESTOR) 10 MG tablet TAKE ONE TABLET EACH DAY 90 tablet 1  . triamterene-hydrochlorothiazide (MAXZIDE-25) 37.5-25 MG tablet Take 0.5 tablets by mouth daily. 135 tablet 1   No facility-administered medications prior to visit.    ROS Review of Systems  Constitutional: Negative for appetite change, diaphoresis, fatigue and unexpected weight change.  HENT: Negative.   Eyes: Negative for visual disturbance.  Respiratory: Negative for cough, chest tightness, shortness of breath and wheezing.     Cardiovascular: Negative for palpitations and leg swelling.  Gastrointestinal: Negative for abdominal pain, constipation, diarrhea, nausea and vomiting.  Endocrine: Negative.   Genitourinary: Negative.  Negative for difficulty urinating.  Musculoskeletal: Positive for arthralgias. Negative for myalgias.  Skin: Negative.  Negative for color change.  Neurological: Negative.  Negative for dizziness, weakness and headaches.  Hematological: Negative for adenopathy. Does not bruise/bleed easily.  Psychiatric/Behavioral: Negative.     Objective:  BP 132/80 (BP Location: Left Arm, Patient Position: Sitting, Cuff Size: Large)   Pulse 97   Temp 97.9 F (36.6 C) (Oral)   Resp 16   Ht 5\' 2"  (1.575 m)   Wt 117 lb (53.1 kg)   SpO2 95%   BMI 21.40 kg/m   BP Readings from Last 3 Encounters:  01/10/20 132/80  09/12/19 130/80  06/13/19 122/70    Wt Readings from Last 3 Encounters:  01/10/20 117 lb (53.1 kg)  09/12/19 118 lb (53.5 kg)  06/13/19 114 lb (51.7 kg)    Physical Exam Vitals reviewed.  Constitutional:      Appearance: Normal appearance.  HENT:     Nose: Nose normal.     Mouth/Throat:     Mouth: Mucous membranes are moist.  Eyes:     General: No scleral icterus.    Conjunctiva/sclera: Conjunctivae normal.  Cardiovascular:     Rate and Rhythm: Normal rate and regular rhythm.     Heart sounds: No murmur.  Pulmonary:     Effort: Pulmonary effort is normal.     Breath  sounds: No stridor. No wheezing, rhonchi or rales.  Abdominal:     General: Abdomen is flat. Bowel sounds are normal. There is no distension.     Palpations: Abdomen is soft. There is no hepatomegaly, splenomegaly or mass.     Tenderness: There is no abdominal tenderness.  Musculoskeletal:        General: Normal range of motion.     Cervical back: Neck supple.     Right lower leg: No edema.     Left lower leg: No edema.  Lymphadenopathy:     Cervical: No cervical adenopathy.  Skin:    General: Skin is  warm and dry.     Coloration: Skin is not pale.  Neurological:     General: No focal deficit present.     Mental Status: She is alert and oriented to person, place, and time. Mental status is at baseline.  Psychiatric:        Mood and Affect: Mood normal.        Behavior: Behavior normal.     Lab Results  Component Value Date   WBC 9.3 06/13/2019   HGB 13.6 06/13/2019   HCT 40.5 06/13/2019   PLT 304.0 06/13/2019   GLUCOSE 110 (H) 01/10/2020   CHOL 126 06/13/2019   TRIG 106.0 06/13/2019   HDL 39.60 06/13/2019   LDLCALC 65 06/13/2019   ALT 33 03/14/2019   AST 25 03/14/2019   NA 139 01/10/2020   K 3.5 01/10/2020   CL 109 01/10/2020   CREATININE 0.85 01/10/2020   BUN 17 01/10/2020   CO2 22 01/10/2020   TSH 4.70 (H) 01/10/2020   INR 1.0 05/16/2009   HGBA1C 5.9 01/10/2020   MICROALBUR 0.9 06/13/2019    MR Brain Wo Contrast  Result Date: 12/20/2018 CLINICAL DATA:  Falling over the last week.  Confusion and weakness. EXAM: MRI HEAD WITHOUT CONTRAST TECHNIQUE: Multiplanar, multiecho pulse sequences of the brain and surrounding structures were obtained without intravenous contrast. COMPARISON:  07/11/2013.  01/13/2012. FINDINGS: Brain: Diffusion imaging does not show any acute or subacute infarction. There chronic small-vessel ischemic changes of the pons. No focal cerebellar insult. Cerebral hemispheres show old small vessel infarctions in the left lateral thalamus and left basal ganglia. There is an old right posterior frontal cortical and subcortical infarction. There are mild chronic small-vessel ischemic changes of the deep white matter. There is some hemosiderin deposition in the region of the old infarctions in the left thalamus and basal ganglia. There could be a developmental venous anomaly in that region. No sign of mass, acute hemorrhage, hydrocephalus or extra-axial collection. Vascular: Major vessels at the base of the brain show flow. Skull and upper cervical spine: Chronic  arthropathy at the C1-2 articulation but without encroachment upon the spinal canal. Sinuses/Orbits: Clear/normal Other: None IMPRESSION: No acute or reversible finding. Chronic small-vessel ischemic changes of the pons and cerebral hemispheric white matter. 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 I wonder if there is a developmental venous anomaly in this region as well. If so, this is obviously lifelong and would not be specifically treatable in any way. Electronically Signed   By: Nelson Chimes M.D.   On: 12/20/2018 08:01    Assessment & Plan:   Lace was seen today for hypothyroidism, hypertension, diabetes and annual exam.  Diagnoses and all orders for this visit:  Acquired hypothyroidism-her TSH is in the acceptable range.  She will stay on the current dose  of levothyroxine. -     TSH; Future -     TSH  Essential hypertension- Her blood pressure is adequately well controlled.  Electrolytes and renal function are normal. -     Basic metabolic panel; Future -     Basic metabolic panel  Type II diabetes mellitus with manifestations ([place])- Her blood sugar is adequately well controlled. -     Hemoglobin A1c; Future -     Hemoglobin A1c  Hypercalcemia- Her calcium level is normal now. -     Basic metabolic panel; Future -     Basic metabolic panel  Routine general medical examination at a health care facility - Exam completed, labs reviewed, vaccines reviewed and updated, no cancer screenings are indicated, patient education was given.   I am having Mishawn I. Foots maintain her Ascorbic Acid (VITAMIN C PO), Cholecalciferol (VITAMIN D PO), triamterene-hydrochlorothiazide, losartan, potassium chloride, rosuvastatin, meloxicam, amLODipine, and levothyroxine.  No orders of the defined types were placed in this encounter.    Follow-up: Return in about 6 months (around 07/12/2020).  Scarlette Calico, MD

## 2020-01-15 ENCOUNTER — Other Ambulatory Visit: Payer: Self-pay | Admitting: Internal Medicine

## 2020-02-17 IMAGING — DX DG RIBS 2V*R*
2 series · 2 of 2 positions shown · non-contrast
Comparison: Chest x-ray 12/23/2015

CLINICAL DATA: Fell 4 days ago.  Right-sided chest pain.

EXAM:
RIGHT RIBS - 2 VIEW

[rib ap]
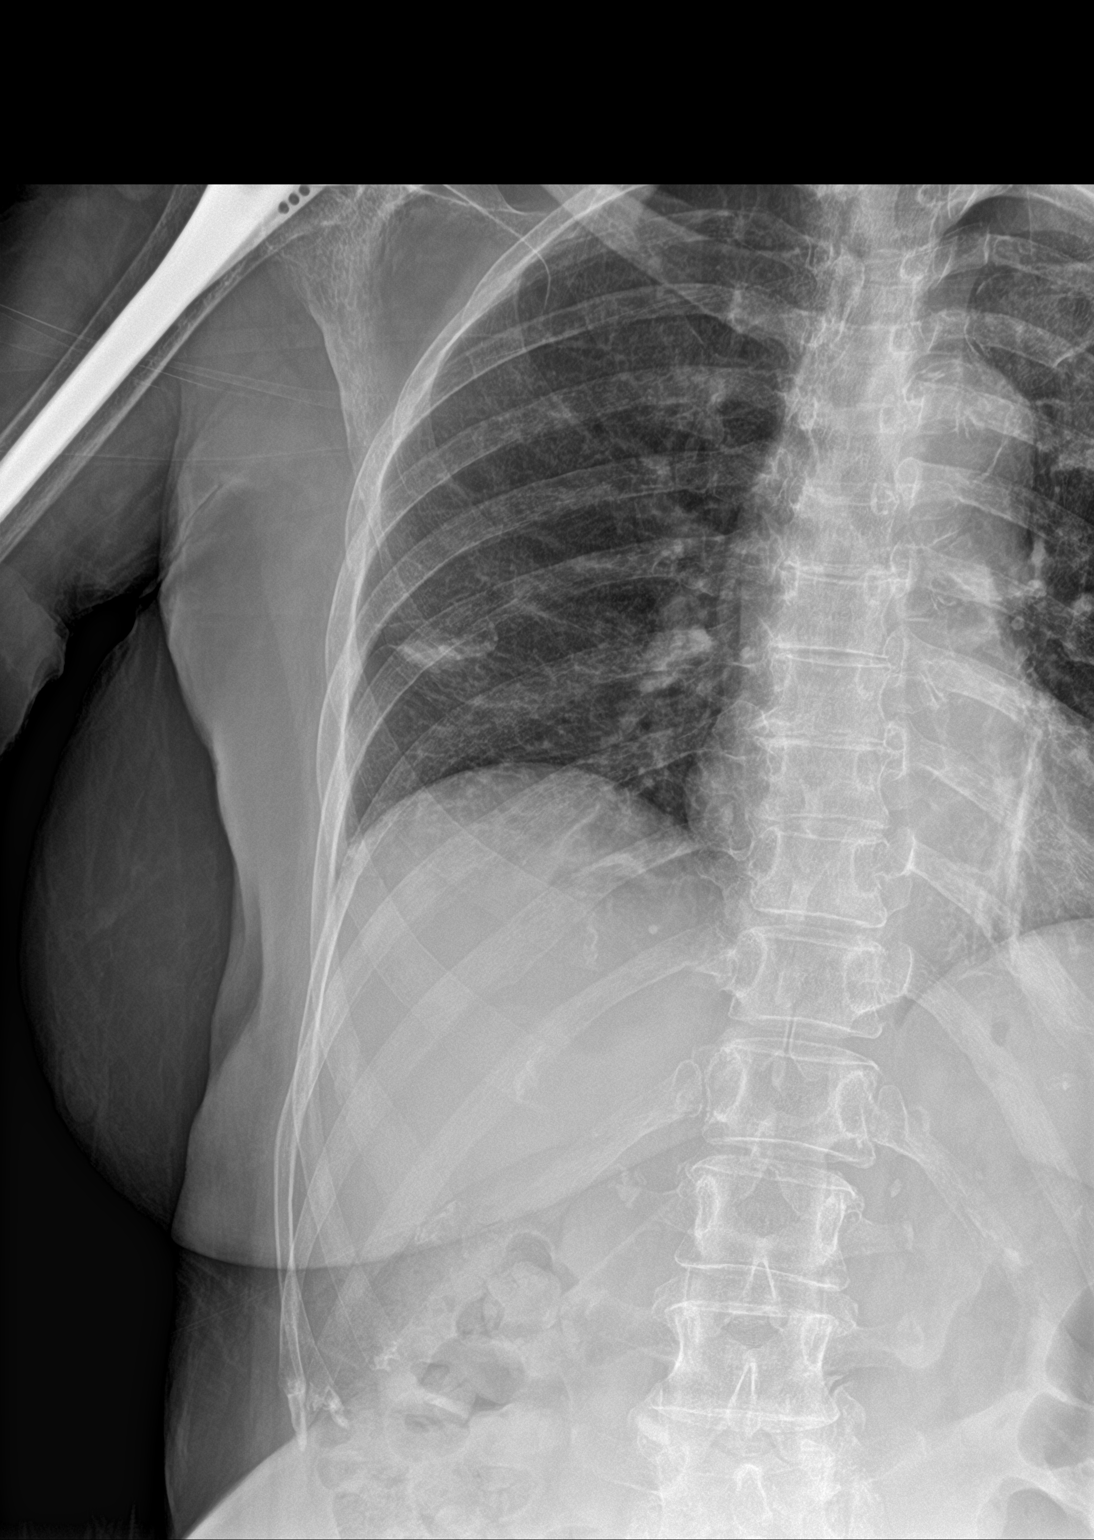

[rib obl]
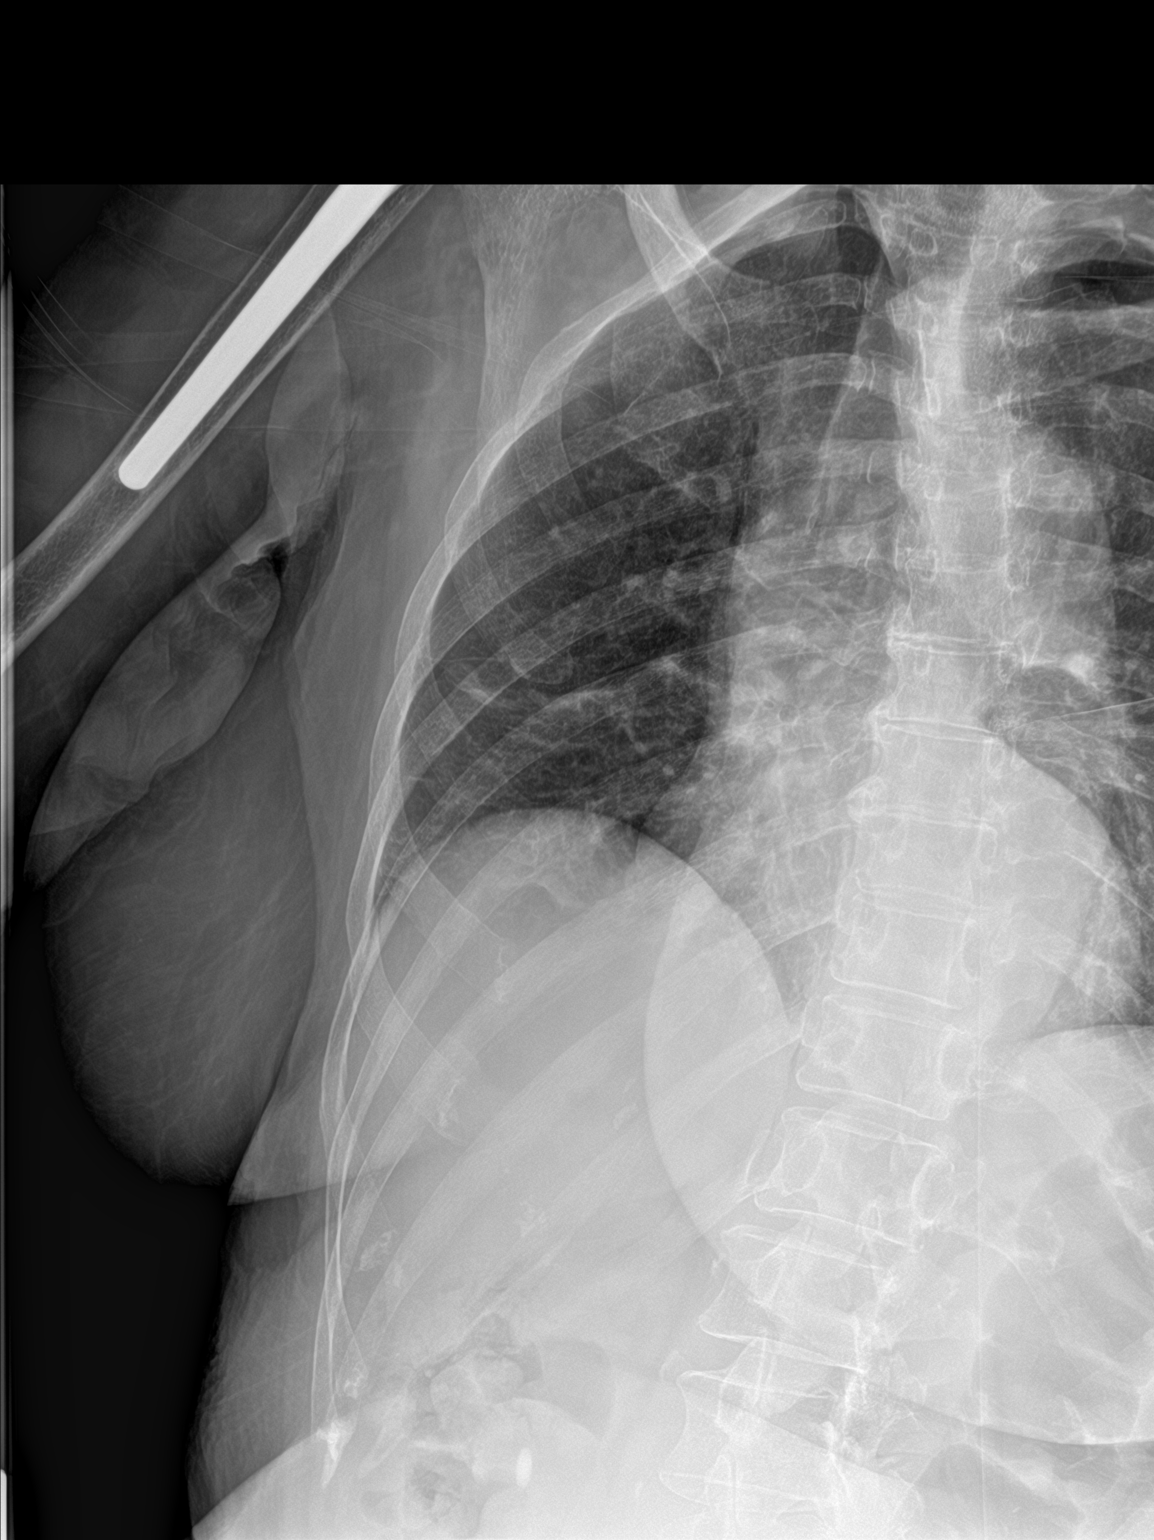

[2 of 2 positions shown; findings below may reference images not displayed]

FINDINGS: There is a nondisplaced fracture involving the sixth and seventh
anterior ribs. No other definite fractures are identified. Streaky
right basilar atelectasis but no pleural effusion or pneumothorax.
IMPRESSION: Right sixth and seventh rib fractures.

Streaky atelectasis but no effusion or pneumothorax.

## 2020-03-01 IMAGING — DX DG CHEST 2V
2 series · 2 of 2 positions shown · non-contrast
Comparison: Radiographs 12/23/2015 and 11/30/2018.

CLINICAL DATA: Chest pain after falling 1 week ago. History of
hypertension.

EXAM:
CHEST - 2 VIEW

[chest pa]
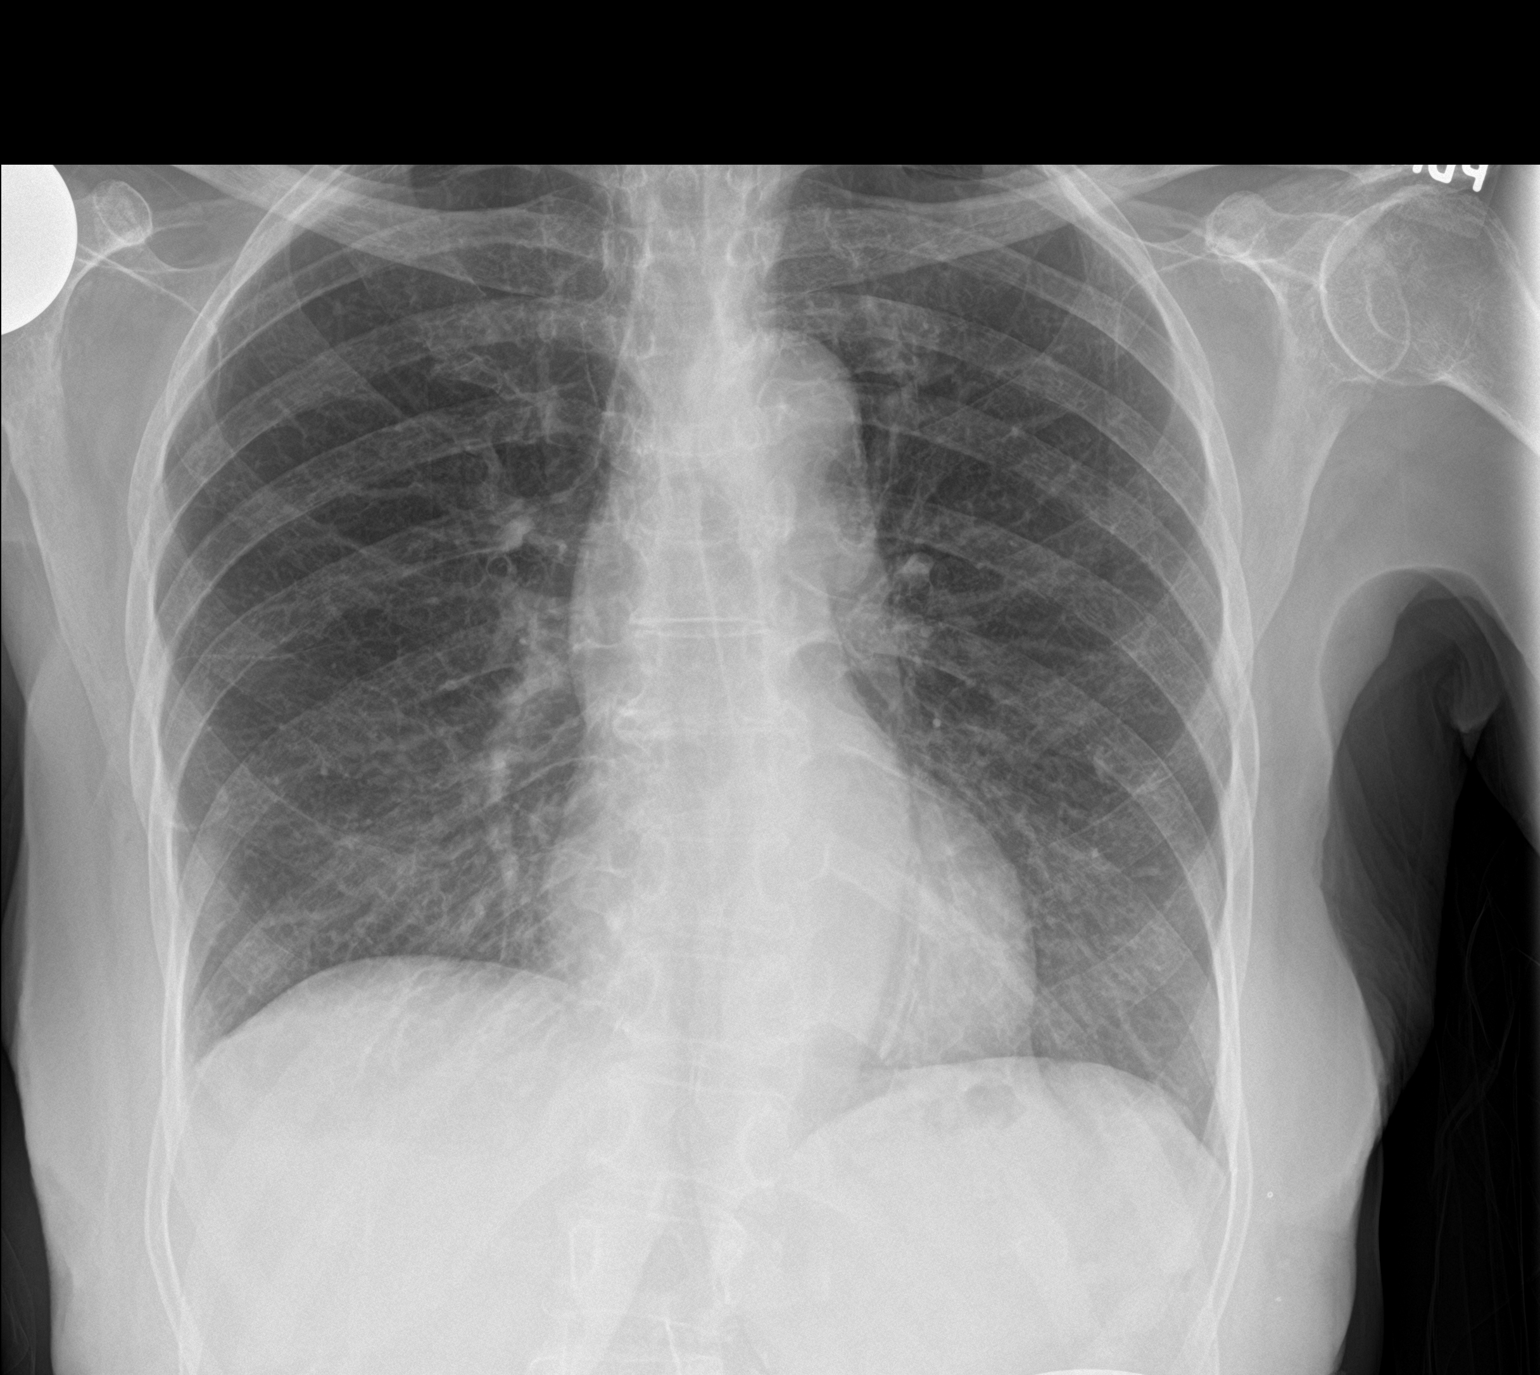

[chest lat]
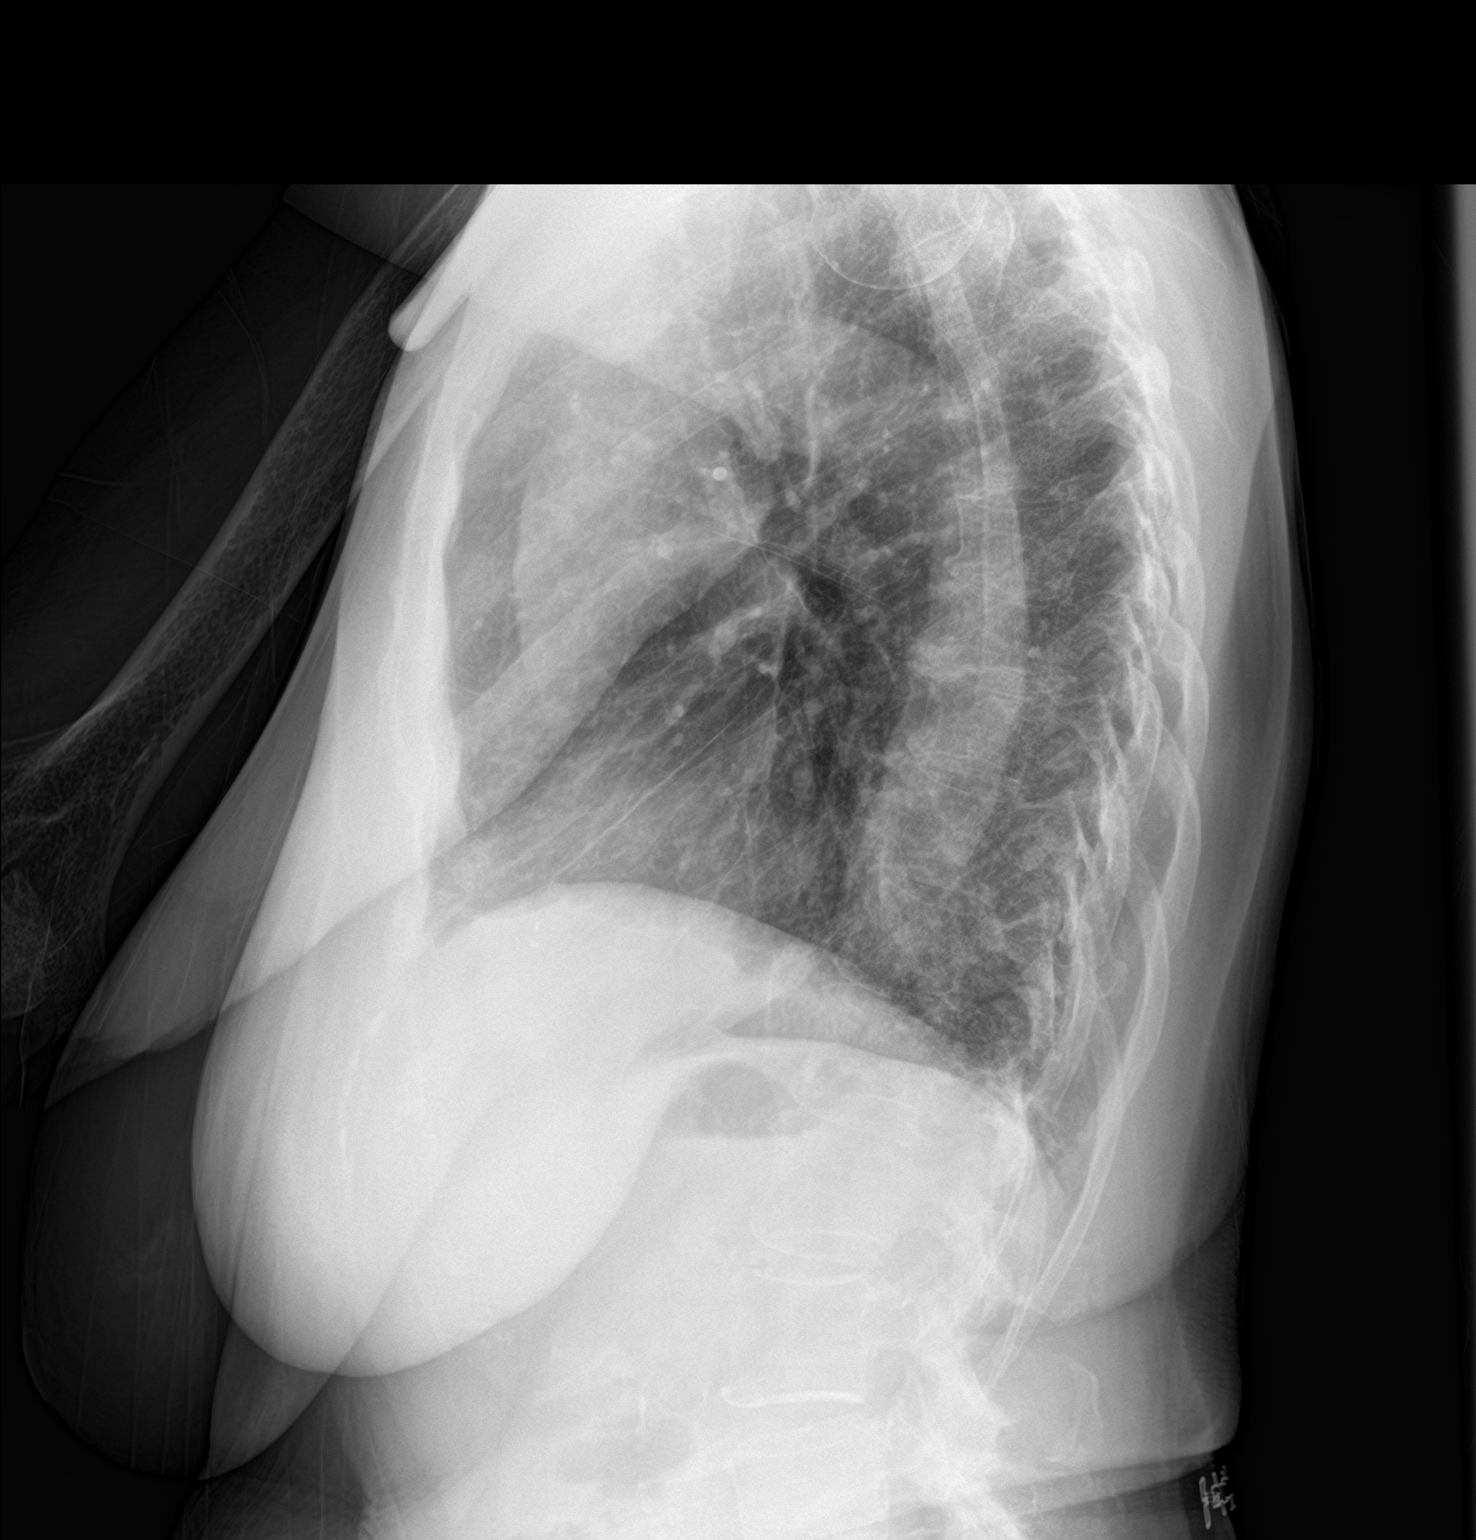

[2 of 2 positions shown; findings below may reference images not displayed]

FINDINGS: The heart size and mediastinal contours are stable. The lungs are
clear. There is no pleural effusion or pneumothorax. The previously
demonstrated right rib fractures are not well visualized. There is a
stable thoracic scoliosis and mild spondylosis. Previous right
shoulder arthroplasty noted.
IMPRESSION: Stable chest without acute findings. Recently demonstrated right rib
fractures are not well visualized.

## 2020-03-07 IMAGING — MR MR HEAD W/O CM
10 series · 48 of 48 positions shown · non-contrast
Comparison: 07/11/2013.  01/13/2012.

CLINICAL DATA: Falling over the last week.  Confusion and weakness.

EXAM:
MRI HEAD WITHOUT CONTRAST
TECHNIQUE: Multiplanar, multiecho pulse sequences of the brain and surrounding
structures were obtained without intravenous contrast.

[Series 2: T1 · sagittal · 5.0mm · 0.45mm/px · 3 of 21 slices shown]
[im 1/21]
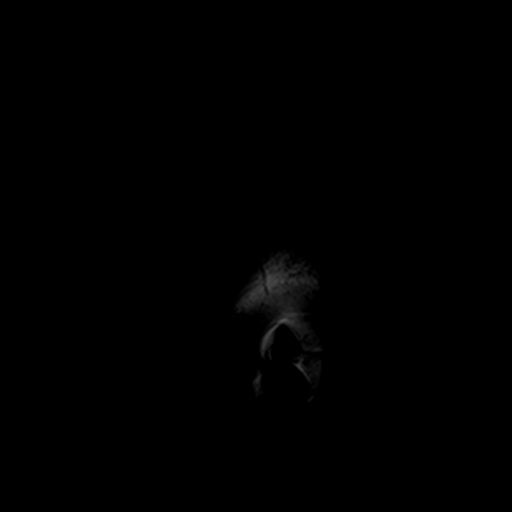
[im 11/21]
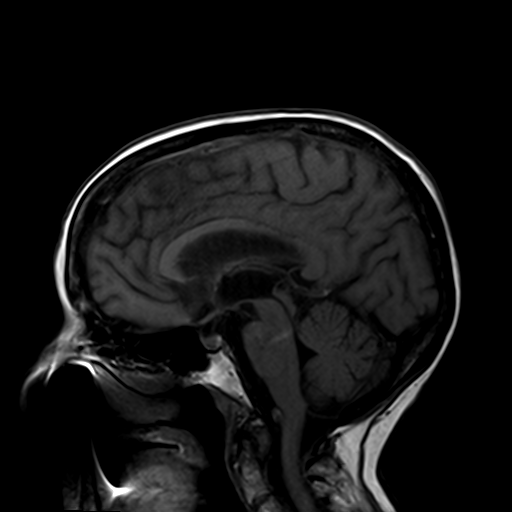
[im 21/21]
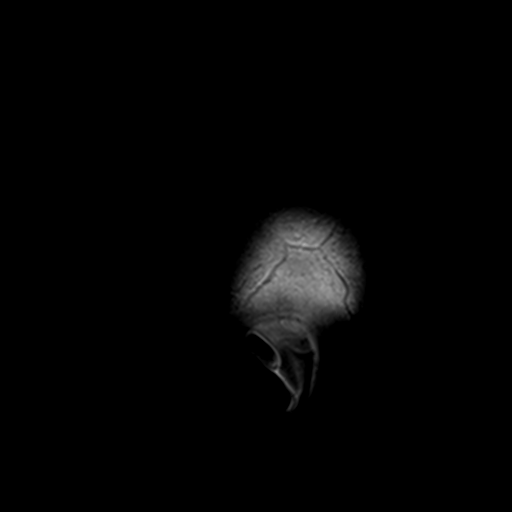

[Series 3: DWI · axial · 3.0mm · 1.80mm/px · z∈[-99,+47]mm · 9 of 97 slices shown (1 of 4)]
[im 1/97]
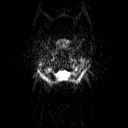
[im 13/97]
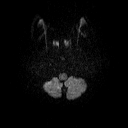
[im 25/97]
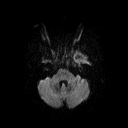
[im 37/97]
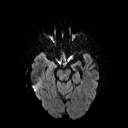
[im 49/97]
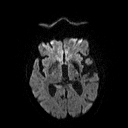
[im 61/97]
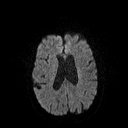
[im 73/97]
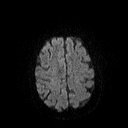
[im 85/97]
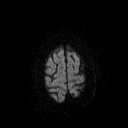
[im 97/97]
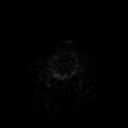

[Series 4: DWI · axial · 3.0mm · 1.80mm/px · z∈[-99,+47]mm · 4 of 47 slices shown (2 of 4)]
[im 1/47]
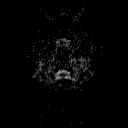
[im 16/47]
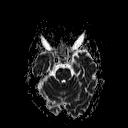
[im 31/47]
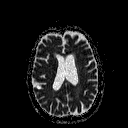
[im 47/47]
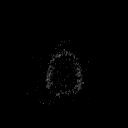

[Series 5: DWI · coronal · 5.0mm · 1.80mm/px · 6 of 62 slices shown (3 of 4)]
[im 1/62]
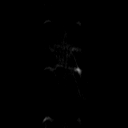
[im 13/62]
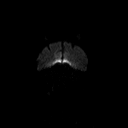
[im 25/62]
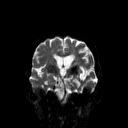
[im 37/62]
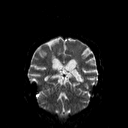
[im 49/62]
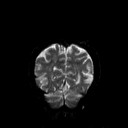
[im 62/62]
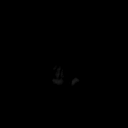

[Series 6: DWI · coronal · 5.0mm · 1.80mm/px · 3 of 34 slices shown (4 of 4)]
[im 1/34]
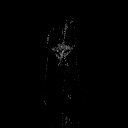
[im 17/34]
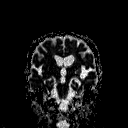
[im 34/34]
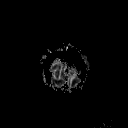

[Series 7: T2 · axial · 5.0mm · 0.51mm/px · z∈[-98,+49]mm · 2 of 22 slices shown (1 of 2)]
[im 1/22]
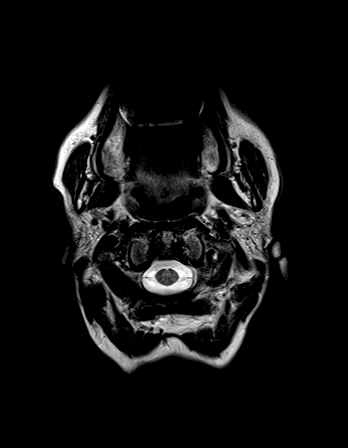
[im 22/22]
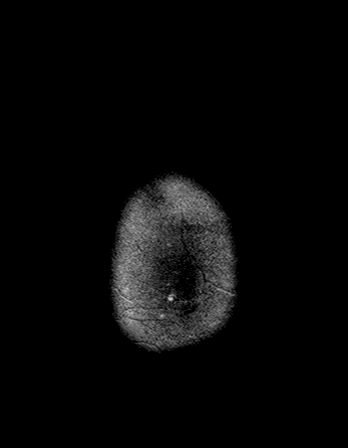

[Series 8: FLAIR · axial · 3.0mm · 0.45mm/px · z∈[-89,+46]mm · 3 of 30 slices shown]
[im 1/30]
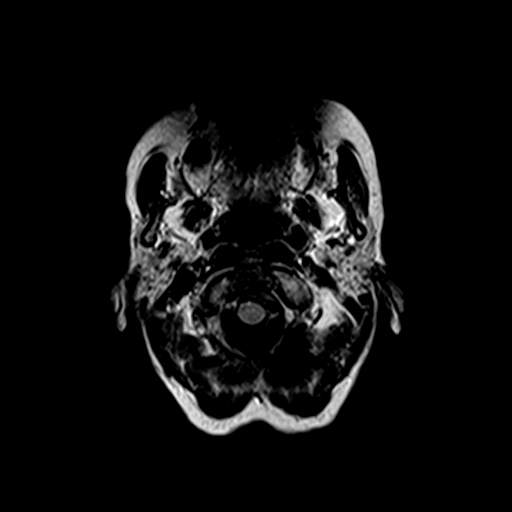
[im 15/30]
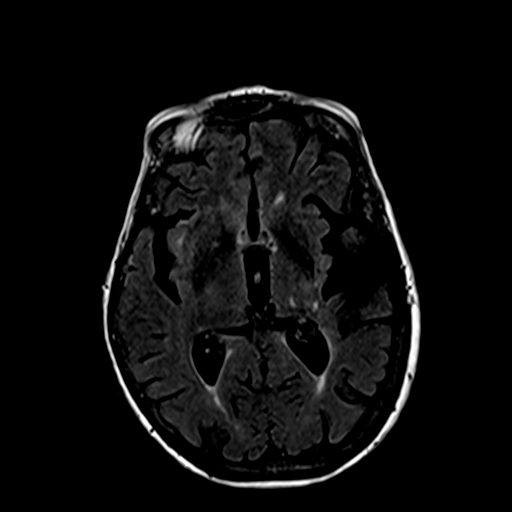
[im 30/30]
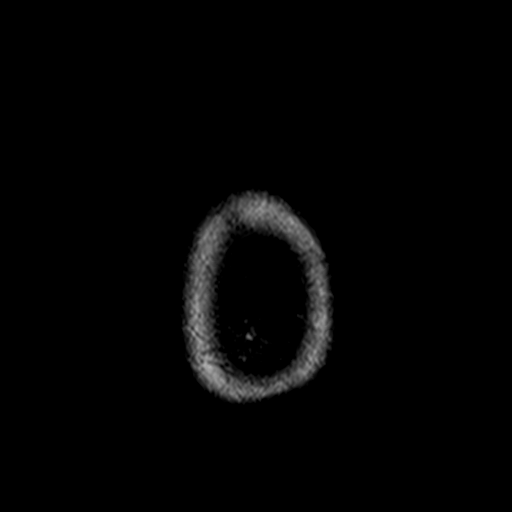

[Series 10: swi_images · axial · 4.0mm · 0.90mm/px · z∈[-91,+49]mm · 3 of 36 slices shown]
[im 1/36]
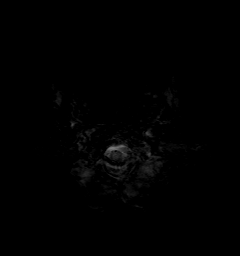
[im 18/36]
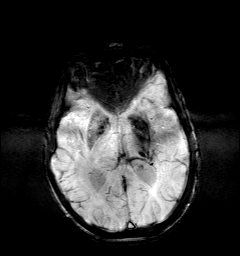
[im 36/36]
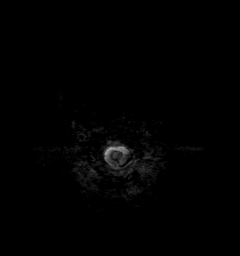

[Series 11: t1_mpr_tra · axial · 1.0mm · 0.71mm/px · z∈[-92,+51]mm · 13 of 144 slices shown]
[im 1/144]
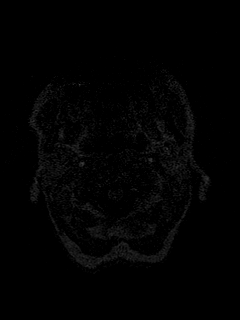
[im 12/144]
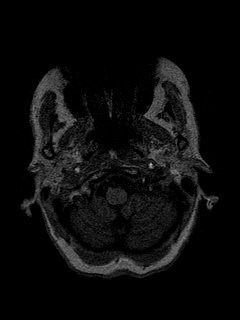
[im 24/144]
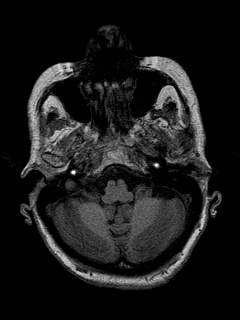
[im 36/144]
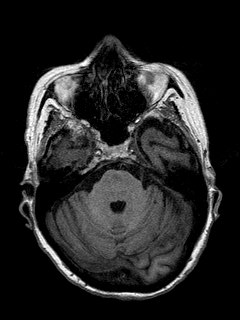
[im 48/144]
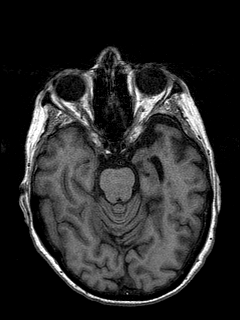
[im 60/144]
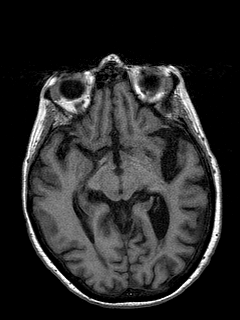
[im 72/144]
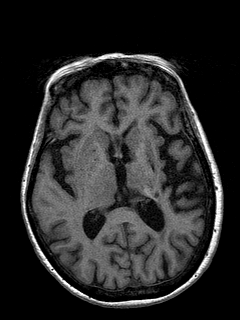
[im 84/144]
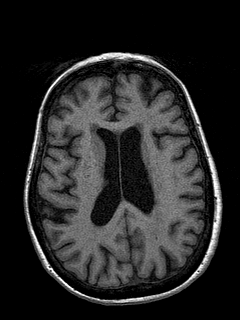
[im 96/144]
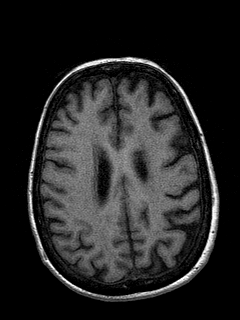
[im 108/144]
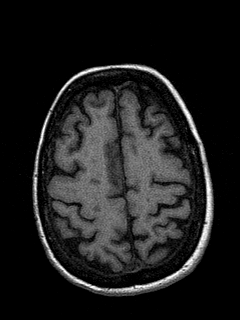
[im 120/144]
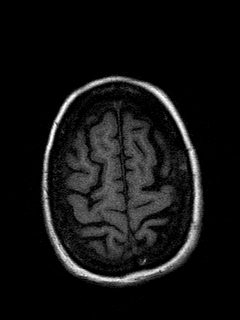
[im 132/144]
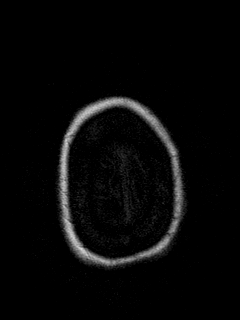
[im 144/144]
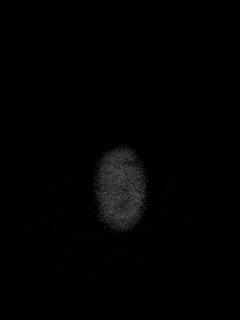

[Series 12: T2 · coronal · 5.0mm · 0.45mm/px · 2 of 25 slices shown (2 of 2)]
[im 1/25]
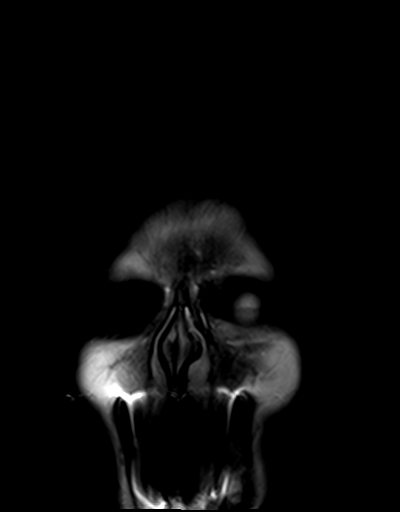
[im 25/25]
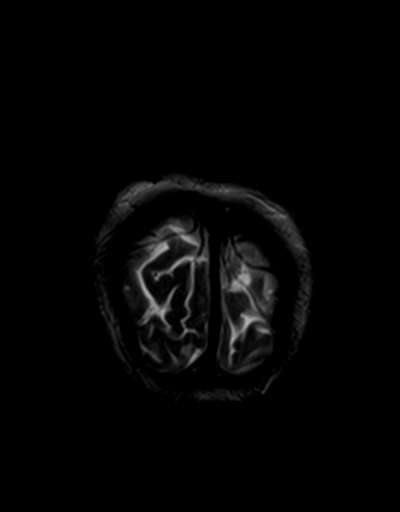

[48 of 48 positions shown; findings below may reference images not displayed]

FINDINGS: Brain: Diffusion imaging does not show any acute or subacute
infarction. There chronic small-vessel ischemic changes of the pons.
No focal cerebellar insult. Cerebral hemispheres show old small
vessel infarctions in the left lateral thalamus and left basal
ganglia. There is an old right posterior frontal cortical and
subcortical infarction. There are mild chronic small-vessel ischemic
changes of the deep white matter. There is some hemosiderin
deposition in the region of the old infarctions in the left thalamus
and basal ganglia. There could be a developmental venous anomaly in
that region. No sign of mass, acute hemorrhage, hydrocephalus or
extra-axial collection.

Vascular: Major vessels at the base of the brain show flow.

Skull and upper cervical spine: Chronic arthropathy at the C1-2
articulation but without encroachment upon the spinal canal.

Sinuses/Orbits: Clear/normal

Other: None
IMPRESSION: No acute or reversible finding.

Chronic small-vessel ischemic changes of the pons and cerebral
hemispheric white matter.

Old small vessel infarctions in the left thalamus and left basal
ganglia with hemosiderin deposition. I wonder if there is a
developmental venous anomaly in this region as well. If so, this is
obviously lifelong and would not be specifically treatable in any
way.

## 2020-03-21 ENCOUNTER — Other Ambulatory Visit: Payer: Self-pay | Admitting: Internal Medicine

## 2020-03-21 DIAGNOSIS — E039 Hypothyroidism, unspecified: Secondary | ICD-10-CM

## 2020-03-22 ENCOUNTER — Other Ambulatory Visit: Payer: Self-pay | Admitting: Internal Medicine

## 2020-03-22 DIAGNOSIS — M19011 Primary osteoarthritis, right shoulder: Secondary | ICD-10-CM

## 2020-04-15 ENCOUNTER — Other Ambulatory Visit: Payer: Self-pay | Admitting: Internal Medicine

## 2020-04-15 DIAGNOSIS — E7800 Pure hypercholesterolemia, unspecified: Secondary | ICD-10-CM

## 2020-05-10 ENCOUNTER — Other Ambulatory Visit: Payer: Self-pay

## 2020-05-10 ENCOUNTER — Ambulatory Visit (INDEPENDENT_AMBULATORY_CARE_PROVIDER_SITE_OTHER): Payer: Medicare Other

## 2020-05-10 VITALS — BP 130/80 | HR 100 | Temp 98.0°F | Resp 16 | Ht 62.0 in | Wt 119.2 lb

## 2020-05-10 DIAGNOSIS — Z0000 Encounter for general adult medical examination without abnormal findings: Secondary | ICD-10-CM

## 2020-05-10 NOTE — Progress Notes (Addendum)
Subjective:   Jackie Horton is a 86 y.o. female who presents for Medicare Annual (Subsequent) preventive examination.  Review of Systems    NO ROS. Medicare Wellness Visit Cardiac Risk Factors include: advanced age (>41men, >5 women);diabetes mellitus;dyslipidemia;hypertension     Objective:    Today's Vitals   05/10/20 1056  BP: 130/80  Pulse: 100  Resp: 16  Temp: 98 F (36.7 C)  SpO2: 97%  Weight: 119 lb 3.2 oz (54.1 kg)  Height: 5\' 2"  (1.575 m)  PainSc: 0-No pain   Body mass index is 21.8 kg/m.  Advanced Directives 05/10/2020 12/13/2018 08/17/2017 07/08/2016  Does Patient Have a Medical Advance Directive? Yes Yes Yes No  Type of Advance Directive - [place];Living will [place];Living will -  Does patient want to make changes to medical advance directive? No - Patient declined - - -  Copy of [place] in Chart? - No - copy requested No - copy requested -  Would patient like information on creating a medical advance directive? - - - No - patient declined information    Current Medications (verified) Outpatient Encounter Medications as of 05/10/2020  Medication Sig  . amLODipine (NORVASC) 10 MG tablet TAKE ONE TABLET DAILY  . Ascorbic Acid (VITAMIN C PO) Take 1 tablet by mouth daily.  . Cholecalciferol (VITAMIN D PO) Take 1 tablet by mouth daily.  Marland Kitchen levothyroxine (SYNTHROID) 50 MCG tablet TAKE ONE TABLET DAILY  . losartan (COZAAR) 50 MG tablet TAKE ONE TABLET EACH DAY  . meloxicam (MOBIC) 7.5 MG tablet TAKE ONE TABLET DAILY  . potassium chloride (KLOR-CON) 10 MEQ tablet TAKE ONE TABLET EACH DAY  . rosuvastatin (CRESTOR) 10 MG tablet TAKE ONE TABLET DAILY  . triamterene-hydrochlorothiazide (MAXZIDE-25) 37.5-25 MG tablet Take 0.5 tablets by mouth daily.   No facility-administered encounter medications on file as of 05/10/2020.    Allergies (verified) Patient has no known allergies.   History: Past  Medical History:  Diagnosis Date  . Breast cancer ([place])   . Cigarette smoker   . Constipation   . Hypercholesteremia   . Hypertension   . Hypothyroid   . Osteoarthritis    Past Surgical History:  Procedure Laterality Date  . bladder tact  2011  . BREAST LUMPECTOMY  2009  . PARTIAL HYSTERECTOMY  1972  . ROTATOR CUFF REPAIR     Family History  Problem Relation Age of Onset  . Breast cancer Daughter   . Diabetes Daughter   . Breast cancer Sister        3 sisters  . Aneurysm Father    Social History   Socioeconomic History  . Marital status: Single    Spouse name: Not on file  . Number of children: 3  . Years of education: 58  . Highest education level: Not on file  Occupational History  . Occupation: caregiver    Employer: RETIRED  Tobacco Use  . Smoking status: Current Some Day Smoker    Packs/day: 0.25    Types: Cigarettes    Last attempt to quit: 10/14/2015    Years since quitting: 4.5  . Smokeless tobacco: Never Used  Vaping Use  . Vaping Use: Never used  Substance and Sexual Activity  . Alcohol use: No    Alcohol/week: 0.0 standard drinks  . Drug use: No  . Sexual activity: Not Currently    Birth control/protection: Post-menopausal, Surgical  Other Topics Concern  . 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 Difficulty of Paying Living Expenses: Not hard at all  Food Insecurity: No Food Insecurity  . Worried About Charity fundraiser in the Last Year: Never true  . Ran Out of Food in the Last Year: Never true  Transportation Needs: No Transportation Needs  . Lack of Transportation (Medical): No  . Lack of Transportation (Non-Medical): No  Physical Activity: Inactive  . Days of Exercise per Week: 0 days  . Minutes of Exercise per Session: 0 min  Stress: No Stress Concern Present  . Feeling of Stress : Not at all  Social  Connections: Moderately Integrated  . Frequency of Communication with Friends and Family: More than three times a week  . Frequency of Social Gatherings with Friends and Family: Once a week  . Attends Religious Services: More than 4 times per year  . Active Member of Clubs or Organizations: No  . Attends Archivist Meetings: More than 4 times per year  . Marital Status: Widowed    Tobacco Counseling Ready to quit: Not Answered Counseling given: No   Clinical Intake:  Pre-visit preparation completed: Yes  Pain : No/denies pain Pain Score: 0-No pain     BMI - recorded: 21.8 Nutritional Status: BMI of 19-24  Normal Nutritional Risks: None Diabetes: Yes CBG done?: No Did pt. bring in CBG monitor from home?: No  How often do you need to have someone help you when you read instructions, pamphlets, or other written materials from your doctor or pharmacy?: 1 - Never What is the last grade level you completed in school?: High School  Diabetic? yes  Interpreter Needed?: No  Information entered by :: Tima Curet N. Haizlee Henton, LPN   Activities of Daily Living In your present state of health, do you have any difficulty performing the following activities: 05/10/2020  Hearing? Y  Vision? N  Difficulty concentrating or making decisions? Y  Walking or climbing stairs? N  Dressing or bathing? N  Doing errands, shopping? Y  Preparing Food and eating ? N  Using the Toilet? N  In the past six months, have you accidently leaked urine? N  Do you have problems with loss of bowel control? N  Managing your Medications? N  Managing your Finances? N  Housekeeping or managing your Housekeeping? N  Some recent data might be hidden    Patient Care Team: Janith Lima, MD as PCP - General (Internal Medicine)  Indicate any recent Medical Services you may have received from other than [facility] providers in the past year (date may be approximate).     Assessment:   This is a routine  wellness examination for [place].  Hearing/Vision screen No exam data present  Dietary issues and exercise activities discussed: Current Exercise Habits: The patient does not participate in regular exercise at present, Exercise limited by: Other - see comments (Osteoarthritis)  Goals    . Maintain current health status     Stay as healthy and as independent as possible    . Patient Stated     Stay in my home and stay as independent as possible.      Depression Screen PHQ 2/9 Scores 05/10/2020 06/13/2019 12/13/2018 08/17/2017 08/10/2017 01/26/2017 07/08/2016  PHQ - 2 Score 0 1 1 1  0 - 0  PHQ- 9 Score - 4 4 1  - - -  Exception Documentation - - - - - Medical reason -    Fall Risk Fall Risk  05/10/2020 06/13/2019 12/15/2018 12/13/2018 08/17/2017  Falls in the past year? 0 0 1 1 No  Number falls in past yr: 0 0 1 1 -  Injury with Fall? 0 0 1 1 -  Risk for fall due to : No Fall Risks - History of fall(s);Impaired balance/gait;Mental status change Impaired balance/gait;Mental status change -  Follow up Falls evaluation completed;Education provided Falls evaluation completed Falls evaluation completed;Follow up appointment;Education provided;Falls prevention discussed Falls evaluation completed;Education provided -    Any stairs in or around the home? No  If so, are there any without handrails? No  Home free of loose throw rugs in walkways, pet beds, electrical cords, etc? Yes  Adequate lighting in your home to reduce risk of falls? Yes   ASSISTIVE DEVICES UTILIZED TO PREVENT FALLS:  Life alert? No  Use of a cane, walker or w/c? No  Grab bars in the bathroom? No  Shower chair or bench in shower? No  Elevated toilet seat or a handicapped toilet? No   TIMED UP AND GO:  Was the test performed? No .  Length of time to ambulate 10 feet: 0 sec.   Gait slow and steady without use of assistive device  Cognitive Function: MMSE - Mini Mental State Exam 12/13/2018 08/17/2017  Not completed:  Refused -  Orientation to time - 4  Orientation to Place - 5  Registration - 3  Attention/ Calculation - 3  Recall - 2  Language- name 2 objects - 2  Language- repeat - 1  Language- follow 3 step command - 3  Language- read & follow direction - 1  Write a sentence - 1  Copy design - 1  Total score - 26     6CIT Screen 05/10/2020  What Year? 0 points  What month? 0 points  What time? 3 points  Count back from 20 0 points  Months in reverse 0 points  Repeat phrase 10 points  Total Score 13    Immunizations Immunization History  Administered Date(s) Administered  . Fluad Quad(high Dose 65+) 09/12/2019  . Influenza Split 07/25/2012  . Influenza, High Dose Seasonal PF 10/14/2015, 07/06/2016, 08/10/2017, 12/13/2018  . Influenza,inj,Quad PF,6+ Mos 07/20/2013, 10/08/2014  . Pneumococcal Conjugate-13 02/21/2014  . Pneumococcal Polysaccharide-23 03/16/2012, 08/10/2017  . Tdap 02/21/2014    TDAP status: Up to date Flu Vaccine status: Up to date Pneumococcal vaccine status: Up to date Covid-19 vaccine status: Completed vaccines  Qualifies for Shingles Vaccine? Yes   Zostavax completed No   Shingrix Completed?: No.    Education has been provided regarding the importance of this vaccine. Patient has been advised to call insurance company to determine out of pocket expense if they have not yet received this vaccine. Advised may also receive vaccine at local pharmacy or Health Dept. Verbalized acceptance and understanding.  Screening Tests Health Maintenance  Topic Date Due  . COVID-19 Vaccine (1) Never done  . DEXA SCAN  Never done  . OPHTHALMOLOGY EXAM  10/08/2016  . FOOT EXAM  03/13/2020  . INFLUENZA VACCINE  05/26/2020  . HEMOGLOBIN A1C  07/12/2020  . TETANUS/TDAP  02/22/2024  . PNA vac Low Risk Adult  Completed    Health Maintenance  Health Maintenance Due  Topic Date Due  . COVID-19 Vaccine (1) Never done  . DEXA SCAN  Never done  . OPHTHALMOLOGY EXAM  10/08/2016   . FOOT EXAM  03/13/2020    Colorectal cancer screening: No longer required.  Mammogram status: Completed 09/26/2019. Repeat every year Bone Density: never done  Lung Cancer Screening: (Low Dose CT Chest recommended if Age 76-80 years, 30 pack-year currently smoking OR have quit w/in 15years.) does not qualify.   Lung Cancer Screening Referral: no  Additional Screening:  Hepatitis C Screening: does not qualify; Completed no  Vision Screening: Recommended annual ophthalmology exams for early detection of glaucoma and other disorders of the eye. Is the patient up to date with their annual eye exam?  No  Who is the provider or what is the name of the office in which the patient attends annual eye exams? No provider; patient refused referral to eye consultant If pt is not established with a provider, would they like to be referred to a provider to establish care? No .   Dental Screening: Recommended annual dental exams for proper oral hygiene  Community Resource Referral / Chronic Care Management: CRR required this visit?  No   CCM required this visit?  No      Plan:     I have personally reviewed and noted the following in the patient's chart:   . Medical and social history . Use of alcohol, tobacco or illicit drugs  . Current medications and supplements . Functional ability and status . Nutritional status . Physical activity . Advanced directives . List of other physicians . Hospitalizations, surgeries, and ER visits in previous 12 months . Vitals . Screenings to include cognitive, depression, and falls . Referrals and appointments  In addition, I have reviewed and discussed with patient certain preventive protocols, quality metrics, and best practice recommendations. A written personalized care plan for preventive services as well as general preventive health recommendations were provided to patient.     Sheral Flow, LPN   2/48/2500   Nurse Notes:  n/a

## 2020-05-10 NOTE — Patient Instructions (Signed)
Jackie Horton , Thank you for taking time to come for your Medicare Wellness Visit. I appreciate your ongoing commitment to your health goals. Please review the following plan we discussed and let me know if I can assist you in the future.   Screening recommendations/referrals: Colonoscopy: no repeat due to age 86: 09/26/2019 Bone Density: never done Recommended yearly ophthalmology/optometry visit for glaucoma screening and checkup Recommended yearly dental visit for hygiene and checkup  Vaccinations: Influenza vaccine: 09/12/2019 Pneumococcal vaccine: completed Tdap vaccine: 02/21/2014 Shingles vaccine: never done   Covid-19: completed  Advanced directives: Please bring a copy of your health care power of attorney and living will to the office at your convenience.   Conditions/risks identified: Please continue to do your personal lifestyle choices by: daily care of teeth and gums, regular physical activity (goal should be 5 days a week for 30 minutes), eat a healthy diet, avoid tobacco and drug use, limiting any alcohol intake, taking a low-dose aspirin (if not allergic or have been advised by your provider otherwise) and taking vitamins and minerals as recommended by your provider. Continue doing brain stimulating activities (puzzles, reading, adult coloring books, staying active) to keep memory sharp. Continue to eat heart healthy diet (full of fruits, vegetables, whole grains, lean protein, water--limit salt, fat, and sugar intake) and increase physical activity as tolerated.  Next appointment: Please schedule your next Medicare Wellness Visit with your Nurse Health Advisor in 1 year.  Preventive Care 84 Years and Older, Female Preventive care refers to lifestyle choices and visits with your health care provider that can promote health and wellness. What does preventive care include?  A yearly physical exam. This is also called an annual well check.  Dental exams once or twice a  year.  Routine eye exams. Ask your health care provider how often you should have your eyes checked.  Personal lifestyle choices, including:  Daily care of your teeth and gums.  Regular physical activity.  Eating a healthy diet.  Avoiding tobacco and drug use.  Limiting alcohol use.  Practicing safe sex.  Taking low-dose aspirin every day.  Taking vitamin and mineral supplements as recommended by your health care provider. What happens during an annual well check? The services and screenings done by your health care provider during your annual well check will depend on your age, overall health, lifestyle risk factors, and family history of disease. Counseling  Your health care provider may ask you questions about your:  Alcohol use.  Tobacco use.  Drug use.  Emotional well-being.  Home and relationship well-being.  Sexual activity.  Eating habits.  History of falls.  Memory and ability to understand (cognition).  Work and work Statistician.  Reproductive health. Screening  You may have the following tests or measurements:  Height, weight, and BMI.  Blood pressure.  Lipid and cholesterol levels. These may be checked every 5 years, or more frequently if you are over 23 years old.  Skin check.  Lung cancer screening. You may have this screening every year starting at age 84 if you have a 30-pack-year history of smoking and currently smoke or have quit within the past 84 years.  Fecal occult blood test (FOBT) of the stool. You may have this test every year starting at age 84.  Flexible sigmoidoscopy or colonoscopy. You may have a sigmoidoscopy every 5 years or a colonoscopy every 10 years starting at age 84.  Hepatitis C blood test.  Hepatitis B blood test.  Sexually transmitted disease (STD)  testing.  Diabetes screening. This is done by checking your blood sugar (glucose) after you have not eaten for a while (fasting). You may have this done every 1-3  years.  Bone density scan. This is done to screen for osteoporosis. You may have this done starting at age 84.  Mammogram. This may be done every 1-2 years. Talk to your health care provider about how often you should have regular mammograms. Talk with your health care provider about your test results, treatment options, and if necessary, the need for more tests. Vaccines  Your health care provider may recommend certain vaccines, such as:  Influenza vaccine. This is recommended every year.  Tetanus, diphtheria, and acellular pertussis (Tdap, Td) vaccine. You may need a Td booster every 10 years.  Zoster vaccine. You may need this after age 84.  Pneumococcal 13-valent conjugate (PCV13) vaccine. One dose is recommended after age 84.  Pneumococcal polysaccharide (PPSV23) vaccine. One dose is recommended after age 84. Talk to your health care provider about which screenings and vaccines you need and how often you need them. This information is not intended to replace advice given to you by your health care provider. Make sure you discuss any questions you have with your health care provider. Document Released: 11/08/2015 Document Revised: 07/01/2016 Document Reviewed: 08/13/2015 Elsevier Interactive Patient Education  2017 [place] Prevention in the Home Falls can cause injuries. They can happen to people of all ages. There are many things you can do to make your home safe and to help prevent falls. What can I do on the outside of my home?  Regularly fix the edges of walkways and driveways and fix any cracks.  Remove anything that might make you trip as you walk through a door, such as a raised step or threshold.  Trim any bushes or trees on the path to your home.  Use bright outdoor lighting.  Clear any walking paths of anything that might make someone trip, such as rocks or tools.  Regularly check to see if handrails are loose or broken. Make sure that both sides of any  steps have handrails.  Any raised decks and porches should have guardrails on the edges.  Have any leaves, snow, or ice cleared regularly.  Use sand or salt on walking paths during winter.  Clean up any spills in your garage right away. This includes oil or grease spills. What can I do in the bathroom?  Use night lights.  Install grab bars by the toilet and in the tub and shower. Do not use towel bars as grab bars.  Use non-skid mats or decals in the tub or shower.  If you need to sit down in the shower, use a plastic, non-slip stool.  Keep the floor dry. Clean up any water that spills on the floor as soon as it happens.  Remove soap buildup in the tub or shower regularly.  Attach bath mats securely with double-sided non-slip rug tape.  Do not have throw rugs and other things on the floor that can make you trip. What can I do in the bedroom?  Use night lights.  Make sure that you have a light by your bed that is easy to reach.  Do not use any sheets or blankets that are too big for your bed. They should not hang down onto the floor.  Have a firm chair that has side arms. You can use this for support while you get dressed.  Do not  have throw rugs and other things on the floor that can make you trip. What can I do in the kitchen?  Clean up any spills right away.  Avoid walking on wet floors.  Keep items that you use a lot in easy-to-reach places.  If you need to reach something above you, use a strong step stool that has a grab bar.  Keep electrical cords out of the way.  Do not use floor polish or wax that makes floors slippery. If you must use wax, use non-skid floor wax.  Do not have throw rugs and other things on the floor that can make you trip. What can I do with my stairs?  Do not leave any items on the stairs.  Make sure that there are handrails on both sides of the stairs and use them. Fix handrails that are broken or loose. Make sure that handrails are  as long as the stairways.  Check any carpeting to make sure that it is firmly attached to the stairs. Fix any carpet that is loose or worn.  Avoid having throw rugs at the top or bottom of the stairs. If you do have throw rugs, attach them to the floor with carpet tape.  Make sure that you have a light switch at the top of the stairs and the bottom of the stairs. If you do not have them, ask someone to add them for you. What else can I do to help prevent falls?  Wear shoes that:  Do not have high heels.  Have rubber bottoms.  Are comfortable and fit you well.  Are closed at the toe. Do not wear sandals.  If you use a stepladder:  Make sure that it is fully opened. Do not climb a closed stepladder.  Make sure that both sides of the stepladder are locked into place.  Ask someone to hold it for you, if possible.  Clearly mark and make sure that you can see:  Any grab bars or handrails.  First and last steps.  Where the edge of each step is.  Use tools that help you move around (mobility aids) if they are needed. These include:  Canes.  Walkers.  Scooters.  Crutches.  Turn on the lights when you go into a dark area. Replace any light bulbs as soon as they burn out.  Set up your furniture so you have a clear path. Avoid moving your furniture around.  If any of your floors are uneven, fix them.  If there are any pets around you, be aware of where they are.  Review your medicines with your doctor. Some medicines can make you feel dizzy. This can increase your chance of falling. Ask your doctor what other things that you can do to help prevent falls. This information is not intended to replace advice given to you by your health care provider. Make sure you discuss any questions you have with your health care provider. Document Released: 08/08/2009 Document Revised: 03/19/2016 Document Reviewed: 11/16/2014 Elsevier Interactive Patient Education  2017 [employer].

## 2020-05-15 ENCOUNTER — Encounter: Payer: Self-pay | Admitting: Internal Medicine

## 2020-05-15 ENCOUNTER — Other Ambulatory Visit: Payer: Self-pay

## 2020-05-15 ENCOUNTER — Ambulatory Visit (INDEPENDENT_AMBULATORY_CARE_PROVIDER_SITE_OTHER): Payer: Medicare Other | Admitting: Internal Medicine

## 2020-05-15 VITALS — BP 140/76 | HR 96 | Temp 97.6°F | Resp 16 | Ht 62.0 in | Wt 119.2 lb

## 2020-05-15 DIAGNOSIS — I10 Essential (primary) hypertension: Secondary | ICD-10-CM

## 2020-05-15 DIAGNOSIS — E039 Hypothyroidism, unspecified: Secondary | ICD-10-CM | POA: Diagnosis not present

## 2020-05-15 DIAGNOSIS — Z23 Encounter for immunization: Secondary | ICD-10-CM | POA: Insufficient documentation

## 2020-05-15 MED ORDER — SHINGRIX 50 MCG/0.5ML IM SUSR: 0.5000 mL | Freq: Once | INTRAMUSCULAR | 1 refills | Status: AC

## 2020-05-15 NOTE — Progress Notes (Signed)
Subjective:  Patient ID: Jackie Horton, female    DOB: 1933-04-17  Age: 86 y.o. MRN: [id number]  CC: Hypertension, Hypothyroidism, and Hyperlipidemia  This visit occurred during the SARS-CoV-2 public health emergency.  Safety protocols were in place, including screening questions prior to the visit, additional usage of staff PPE, and extensive cleaning of exam room while observing appropriate contact time as indicated for disinfecting solutions.    HPI RILYA LONGO presents for f/up - She tells me she is in her usual state of health with no new complaints or concerns.  Outpatient Medications Prior to Visit  Medication Sig Dispense Refill  . amLODipine (NORVASC) 10 MG tablet TAKE ONE TABLET DAILY 90 tablet 1  . Ascorbic Acid (VITAMIN C PO) Take 1 tablet by mouth daily.    . Cholecalciferol (VITAMIN D PO) Take 1 tablet by mouth daily.    Marland Kitchen levothyroxine (SYNTHROID) 50 MCG tablet TAKE ONE TABLET DAILY 90 tablet 1  . losartan (COZAAR) 50 MG tablet TAKE ONE TABLET EACH DAY 90 tablet 1  . meloxicam (MOBIC) 7.5 MG tablet TAKE ONE TABLET DAILY 90 tablet 1  . potassium chloride (KLOR-CON) 10 MEQ tablet TAKE ONE TABLET EACH DAY 90 tablet 1  . rosuvastatin (CRESTOR) 10 MG tablet TAKE ONE TABLET DAILY 90 tablet 1  . triamterene-hydrochlorothiazide (MAXZIDE-25) 37.5-25 MG tablet Take 0.5 tablets by mouth daily. 135 tablet 1   No facility-administered medications prior to visit.    ROS Review of Systems  Constitutional: Positive for fatigue. Negative for chills and diaphoresis.  HENT: Negative.  Negative for trouble swallowing.   Eyes: Negative for visual disturbance.  Respiratory: Negative for cough, chest tightness, shortness of breath and wheezing.   Endocrine: Negative for cold intolerance and heat intolerance.  Genitourinary: Negative.  Negative for difficulty urinating, dysuria and hematuria.  Musculoskeletal: Negative.  Negative for arthralgias and myalgias.  Skin: Negative.   Negative for color change, pallor and rash.  Neurological: Negative.  Negative for dizziness, seizures, weakness and numbness.  Hematological: Negative for adenopathy. Does not bruise/bleed easily.  Psychiatric/Behavioral: Negative for decreased concentration.    Objective:  BP 140/76 (BP Location: Left Arm, Patient Position: Sitting, Cuff Size: Normal)   Pulse 96   Temp 97.6 F (36.4 C) (Oral)   Resp 16   Ht 5\' 2"  (1.575 m)   Wt 119 lb 4 oz (54.1 kg)   SpO2 97%   BMI 21.81 kg/m   BP Readings from Last 3 Encounters:  05/15/20 140/76  05/10/20 130/80  01/10/20 132/80    Wt Readings from Last 3 Encounters:  05/15/20 119 lb 4 oz (54.1 kg)  05/10/20 119 lb 3.2 oz (54.1 kg)  01/10/20 117 lb (53.1 kg)    Physical Exam Vitals reviewed.  Constitutional:      Appearance: Normal appearance.  HENT:     Nose: Nose normal.     Mouth/Throat:     Mouth: Mucous membranes are moist.  Eyes:     General: No scleral icterus.    Conjunctiva/sclera: Conjunctivae normal.  Cardiovascular:     Rate and Rhythm: Normal rate and regular rhythm.     Heart sounds: No murmur heard.   Pulmonary:     Effort: Pulmonary effort is normal.     Breath sounds: No stridor. No wheezing, rhonchi or rales.  Abdominal:     General: Abdomen is flat. Bowel sounds are normal. There is no distension.     Palpations: Abdomen is soft. There is no  hepatomegaly, splenomegaly or mass.     Tenderness: There is no abdominal tenderness.  Musculoskeletal:        General: Normal range of motion.     Cervical back: Neck supple.     Right lower leg: No edema.     Left lower leg: No edema.  Lymphadenopathy:     Cervical: No cervical adenopathy.  Skin:    General: Skin is warm and dry.     Coloration: Skin is not pale.  Neurological:     General: No focal deficit present.     Mental Status: She is alert.  Psychiatric:        Mood and Affect: Mood normal.        Behavior: Behavior normal.     Lab Results   Component Value Date   WBC 9.3 06/13/2019   HGB 13.6 06/13/2019   HCT 40.5 06/13/2019   PLT 304.0 06/13/2019   GLUCOSE 110 (H) 01/10/2020   CHOL 126 06/13/2019   TRIG 106.0 06/13/2019   HDL 39.60 06/13/2019   LDLCALC 65 06/13/2019   ALT 33 03/14/2019   AST 25 03/14/2019   NA 139 01/10/2020   K 3.5 01/10/2020   CL 109 01/10/2020   CREATININE 0.85 01/10/2020   BUN 17 01/10/2020   CO2 22 01/10/2020   TSH 4.70 (H) 01/10/2020   INR 1.0 05/16/2009   HGBA1C 5.9 01/10/2020   MICROALBUR 0.9 06/13/2019    MR Brain Wo Contrast  Result Date: 12/20/2018 CLINICAL DATA:  Falling over the last week.  Confusion and weakness. EXAM: MRI HEAD WITHOUT CONTRAST TECHNIQUE: Multiplanar, multiecho pulse sequences of the brain and surrounding structures were obtained without intravenous contrast. COMPARISON:  07/11/2013.  01/13/2012. FINDINGS: Brain: Diffusion imaging does not show any acute or subacute infarction. There chronic small-vessel ischemic changes of the pons. No focal cerebellar insult. Cerebral hemispheres show old small vessel infarctions in the left lateral thalamus and left basal ganglia. There is an old right posterior frontal cortical and subcortical infarction. There are mild chronic small-vessel ischemic changes of the deep white matter. There is some hemosiderin deposition in the region of the old infarctions in the left thalamus and basal ganglia. There could be a developmental venous anomaly in that region. No sign of mass, acute hemorrhage, hydrocephalus or extra-axial collection. Vascular: Major vessels at the base of the brain show flow. Skull and upper cervical spine: Chronic arthropathy at the C1-2 articulation but without encroachment upon the spinal canal. Sinuses/Orbits: Clear/normal Other: None IMPRESSION: No acute or reversible finding. Chronic small-vessel ischemic changes of the pons and cerebral hemispheric white matter. 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 I wonder if there is a developmental venous anomaly in this region as well. If so, this is obviously lifelong and would not be specifically treatable in any way. Electronically Signed   By: Nelson Chimes M.D.   On: 12/20/2018 08:01    Assessment & Plan:   Aylanie was seen today for hypertension, hypothyroidism and hyperlipidemia.  Diagnoses and all orders for this visit:  Acquired hypothyroidism- I will monitor her TSH and will adjust her dose if indicated. -     TSH; Future  Essential hypertension- Her blood pressure is adequately well controlled. -     CBC with Differential/Platelet; Future  Hypercalcemia- I will monitor her calcium level and will screen for hyperparathyroidism. -     PTH, intact and calcium; Future -  BASIC METABOLIC PANEL WITH GFR; Future  Need for shingles vaccine -     Zoster Vaccine Adjuvanted [facility]) injection; Inject 0.5 mLs into the muscle once for 1 dose.   I am having Zyann I. Grunwald start on Shingrix. I am also having her maintain her Ascorbic Acid (VITAMIN C PO), Cholecalciferol (VITAMIN D PO), triamterene-hydrochlorothiazide, losartan, amLODipine, potassium chloride, levothyroxine, meloxicam, and rosuvastatin.  Meds ordered this encounter  Medications  . Zoster Vaccine Adjuvanted [facility]) injection    Sig: Inject 0.5 mLs into the muscle once for 1 dose.    Dispense:  0.5 mL    Refill:  1     Follow-up: Return in about 6 months (around 11/15/2020).  Scarlette Calico, MD

## 2020-05-15 NOTE — Patient Instructions (Signed)

## 2020-06-03 ENCOUNTER — Other Ambulatory Visit: Payer: Self-pay | Admitting: Internal Medicine

## 2020-07-13 ENCOUNTER — Other Ambulatory Visit: Payer: Self-pay | Admitting: Internal Medicine

## 2020-07-15 ENCOUNTER — Ambulatory Visit: Payer: Medicare Other | Admitting: Internal Medicine

## 2020-07-29 ENCOUNTER — Other Ambulatory Visit: Payer: Self-pay | Admitting: Internal Medicine

## 2020-07-29 DIAGNOSIS — I10 Essential (primary) hypertension: Secondary | ICD-10-CM

## 2020-07-29 DIAGNOSIS — E118 Type 2 diabetes mellitus with unspecified complications: Secondary | ICD-10-CM

## 2020-09-25 ENCOUNTER — Other Ambulatory Visit: Payer: Self-pay | Admitting: Internal Medicine

## 2020-09-25 DIAGNOSIS — M19011 Primary osteoarthritis, right shoulder: Secondary | ICD-10-CM

## 2020-09-25 DIAGNOSIS — E039 Hypothyroidism, unspecified: Secondary | ICD-10-CM

## 2020-09-25 DIAGNOSIS — M19012 Primary osteoarthritis, left shoulder: Secondary | ICD-10-CM

## 2020-11-19 ENCOUNTER — Encounter: Payer: Self-pay | Admitting: Internal Medicine

## 2020-11-19 ENCOUNTER — Encounter (INDEPENDENT_AMBULATORY_CARE_PROVIDER_SITE_OTHER): Payer: Self-pay

## 2020-11-19 ENCOUNTER — Ambulatory Visit: Payer: Medicare Other | Admitting: Internal Medicine

## 2020-11-19 ENCOUNTER — Ambulatory Visit (INDEPENDENT_AMBULATORY_CARE_PROVIDER_SITE_OTHER): Payer: Medicare Other | Admitting: Internal Medicine

## 2020-11-19 ENCOUNTER — Other Ambulatory Visit: Payer: Self-pay

## 2020-11-19 VITALS — BP 132/78 | HR 62 | Temp 98.1°F | Resp 16 | Ht 62.0 in | Wt 124.0 lb

## 2020-11-19 DIAGNOSIS — E118 Type 2 diabetes mellitus with unspecified complications: Secondary | ICD-10-CM

## 2020-11-19 DIAGNOSIS — I10 Essential (primary) hypertension: Secondary | ICD-10-CM | POA: Diagnosis not present

## 2020-11-19 DIAGNOSIS — E039 Hypothyroidism, unspecified: Secondary | ICD-10-CM

## 2020-11-19 DIAGNOSIS — Z23 Encounter for immunization: Secondary | ICD-10-CM | POA: Diagnosis not present

## 2020-11-19 DIAGNOSIS — N1832 Chronic kidney disease, stage 3b: Secondary | ICD-10-CM | POA: Insufficient documentation

## 2020-11-19 LAB — CBC WITH DIFFERENTIAL/PLATELET
Basophils Absolute: 0.1 10*3/uL (ref 0.0–0.1)
Basophils Relative: 0.6 % (ref 0.0–3.0)
Eosinophils Absolute: 0.1 10*3/uL (ref 0.0–0.7)
Eosinophils Relative: 0.9 % (ref 0.0–5.0)
HCT: 46.7 % — ABNORMAL HIGH (ref 36.0–46.0)
Hemoglobin: 15.4 g/dL — ABNORMAL HIGH (ref 12.0–15.0)
Lymphocytes Relative: 40.9 % (ref 12.0–46.0)
Lymphs Abs: 4.1 10*3/uL — ABNORMAL HIGH (ref 0.7–4.0)
MCHC: 32.9 g/dL (ref 30.0–36.0)
MCV: 93.1 fl (ref 78.0–100.0)
Monocytes Absolute: 0.7 10*3/uL (ref 0.1–1.0)
Monocytes Relative: 6.7 % (ref 3.0–12.0)
Neutro Abs: 5.1 10*3/uL (ref 1.4–7.7)
Neutrophils Relative %: 50.9 % (ref 43.0–77.0)
Platelets: 214 10*3/uL (ref 150.0–400.0)
RBC: 5.02 Mil/uL (ref 3.87–5.11)
RDW: 13.8 % (ref 11.5–15.5)
WBC: 9.9 10*3/uL (ref 4.0–10.5)

## 2020-11-19 LAB — HEPATIC FUNCTION PANEL
ALT: 44 U/L — ABNORMAL HIGH (ref 0–35)
AST: 28 U/L (ref 0–37)
Albumin: 5 g/dL (ref 3.5–5.2)
Alkaline Phosphatase: 85 U/L (ref 39–117)
Bilirubin, Direct: 0.2 mg/dL (ref 0.0–0.3)
Total Bilirubin: 0.7 mg/dL (ref 0.2–1.2)
Total Protein: 8.3 g/dL (ref 6.0–8.3)

## 2020-11-19 LAB — BASIC METABOLIC PANEL
BUN: 18 mg/dL (ref 6–23)
CO2: 24 mEq/L (ref 19–32)
Calcium: 10.9 mg/dL — ABNORMAL HIGH (ref 8.4–10.5)
Chloride: 108 mEq/L (ref 96–112)
Creatinine, Ser: 0.99 mg/dL (ref 0.40–1.20)
GFR: 52.05 mL/min — ABNORMAL LOW (ref >60.00)
Glucose, Bld: 106 mg/dL — ABNORMAL HIGH (ref 70–99)
Potassium: 4.1 mEq/L (ref 3.5–5.1)
Sodium: 139 mEq/L (ref 135–145)

## 2020-11-19 LAB — HEMOGLOBIN A1C: Hgb A1c MFr Bld: 6.1 % (ref 4.6–6.5)

## 2020-11-19 LAB — TSH: TSH: 6.69 u[IU]/mL — ABNORMAL HIGH (ref 0.35–4.50)

## 2020-11-19 NOTE — Progress Notes (Signed)
Subjective:  Patient ID: Jackie Horton, female    DOB: 05/13/1933  Age: 87 y.o. MRN: [id number]  CC: Hypothyroidism, Hypertension, and Diabetes  This visit occurred during the SARS-CoV-2 public health emergency.  Safety protocols were in place, including screening questions prior to the visit, additional usage of staff PPE, and extensive cleaning of exam room while observing appropriate contact time as indicated for disinfecting solutions.    HPI Jackie Horton presents for f/up -she indicates that she is in her usual state of health.  She tells me her blood pressure has been well controlled and she denies headache, blurred vision, chest pain, or shortness of breath.  She has her usual ataxia and musculoskeletal pain.  She denies any recent falls or injury.  Outpatient Medications Prior to Visit  Medication Sig Dispense Refill  . amLODipine (NORVASC) 10 MG tablet TAKE ONE TABLET DAILY 90 tablet 1  . Ascorbic Acid (VITAMIN C PO) Take 1 tablet by mouth daily.    . Cholecalciferol (VITAMIN D PO) Take 1 tablet by mouth daily.    Marland Kitchen levothyroxine (SYNTHROID) 50 MCG tablet TAKE ONE TABLET DAILY 90 tablet 1  . losartan (COZAAR) 50 MG tablet TAKE ONE TABLET EACH DAY 90 tablet 1  . meloxicam (MOBIC) 7.5 MG tablet TAKE ONE TABLET DAILY 90 tablet 1  . potassium chloride (KLOR-CON) 10 MEQ tablet TAKE ONE TABLET EACH DAY 90 tablet 1  . rosuvastatin (CRESTOR) 10 MG tablet TAKE ONE TABLET DAILY 90 tablet 1  . triamterene-hydrochlorothiazide (MAXZIDE-25) 37.5-25 MG tablet Take 0.5 tablets by mouth daily. 135 tablet 1   No facility-administered medications prior to visit.    ROS Review of Systems  Constitutional: Negative for appetite change, chills, diaphoresis, fatigue and fever.  HENT: Negative.   Eyes: Negative.   Respiratory: Negative for cough, chest tightness, shortness of breath and wheezing.   Cardiovascular: Negative for chest pain, palpitations and leg swelling.  Gastrointestinal:  Negative for abdominal pain, constipation, diarrhea, nausea and vomiting.  Endocrine: Negative for cold intolerance and heat intolerance.  Genitourinary: Negative.  Negative for difficulty urinating.  Musculoskeletal: Positive for gait problem. Negative for arthralgias and myalgias.  Skin: Negative.  Negative for color change.  Neurological: Negative for dizziness, weakness and light-headedness.  Hematological: Negative for adenopathy. Does not bruise/bleed easily.  Psychiatric/Behavioral: Negative.     Objective:  BP 132/78   Pulse 62   Temp 98.1 F (36.7 C) (Oral)   Resp 16   Ht 5\' 2"  (1.575 m)   Wt 124 lb (56.2 kg)   SpO2 97%   BMI 22.68 kg/m   BP Readings from Last 3 Encounters:  11/19/20 132/78  05/15/20 140/76  05/10/20 130/80    Wt Readings from Last 3 Encounters:  11/19/20 124 lb (56.2 kg)  05/15/20 119 lb 4 oz (54.1 kg)  05/10/20 119 lb 3.2 oz (54.1 kg)    Physical Exam Vitals reviewed.  Constitutional:      Appearance: Normal appearance.  HENT:     Nose: Nose normal.     Mouth/Throat:     Mouth: Mucous membranes are moist.  Eyes:     General: No scleral icterus.    Conjunctiva/sclera: Conjunctivae normal.  Cardiovascular:     Rate and Rhythm: Normal rate and regular rhythm.     Heart sounds: No murmur heard.   Pulmonary:     Effort: Pulmonary effort is normal.     Breath sounds: No stridor. No wheezing, rhonchi or rales.  Abdominal:  General: Abdomen is flat. Bowel sounds are normal. There is no distension.     Palpations: Abdomen is soft. There is no hepatomegaly, splenomegaly or mass.  Musculoskeletal:        General: Normal range of motion.     Cervical back: Neck supple.     Right lower leg: No edema.     Left lower leg: No edema.  Skin:    General: Skin is warm and dry.  Neurological:     Mental Status: She is alert. Mental status is at baseline.  Psychiatric:        Mood and Affect: Mood normal.        Behavior: Behavior normal.      Lab Results  Component Value Date   WBC 9.9 11/19/2020   HGB 15.4 (H) 11/19/2020   HCT 46.7 (H) 11/19/2020   PLT 214.0 11/19/2020   GLUCOSE 106 (H) 11/19/2020   CHOL 126 06/13/2019   TRIG 106.0 06/13/2019   HDL 39.60 06/13/2019   LDLCALC 65 06/13/2019   ALT 44 (H) 11/19/2020   AST 28 11/19/2020   NA 139 11/19/2020   K 4.1 11/19/2020   CL 108 11/19/2020   CREATININE 0.99 11/19/2020   BUN 18 11/19/2020   CO2 24 11/19/2020   TSH 6.69 (H) 11/19/2020   INR 1.0 05/16/2009   HGBA1C 6.1 11/19/2020   MICROALBUR 0.9 06/13/2019    MR Brain Wo Contrast  Result Date: 12/20/2018 CLINICAL DATA:  Falling over the last week.  Confusion and weakness. EXAM: MRI HEAD WITHOUT CONTRAST TECHNIQUE: Multiplanar, multiecho pulse sequences of the brain and surrounding structures were obtained without intravenous contrast. COMPARISON:  07/11/2013.  01/13/2012. FINDINGS: Brain: Diffusion imaging does not show any acute or subacute infarction. There chronic small-vessel ischemic changes of the pons. No focal cerebellar insult. Cerebral hemispheres show old small vessel infarctions in the left lateral thalamus and left basal ganglia. There is an old right posterior frontal cortical and subcortical infarction. There are mild chronic small-vessel ischemic changes of the deep white matter. There is some hemosiderin deposition in the region of the old infarctions in the left thalamus and basal ganglia. There could be a developmental venous anomaly in that region. No sign of mass, acute hemorrhage, hydrocephalus or extra-axial collection. Vascular: Major vessels at the base of the brain show flow. Skull and upper cervical spine: Chronic arthropathy at the C1-2 articulation but without encroachment upon the spinal canal. Sinuses/Orbits: Clear/normal Other: None IMPRESSION: No acute or reversible finding. Chronic small-vessel ischemic changes of the pons and cerebral hemispheric white matter. Old small vessel  infarctions in the left thalamus and left basal ganglia with hemosiderin deposition. I wonder if there is a developmental venous anomaly in this region as well. If so, this is obviously lifelong and would not be specifically treatable in any way. Electronically Signed   By: Nelson Chimes M.D.   On: 12/20/2018 08:01    Assessment & Plan:   Jackie Horton was seen today for hypothyroidism, hypertension and diabetes.  Diagnoses and all orders for this visit:  Primary hypertension-her blood pressure is adequately well controlled.  Electrolytes are okay and renal function is stable. -     CBC with Differential/Platelet; Future -     Basic metabolic panel; Future -     Hepatic function panel; Future -     CBC with Differential/Platelet -     Basic metabolic panel -     Hepatic function panel  Acquired hypothyroidism-her TSH is in  the acceptable range.  She will stay on the current dose of levothyroxine. -     TSH; Future -     Hepatic function panel; Future -     TSH -     Hepatic function panel  Hypercalcemia- Her calcium level is mildly elevated but she is asymptomatic with this.  I will continue to monitor her. -     Basic metabolic panel; Future -     Hepatic function panel; Future -     Basic metabolic panel -     Hepatic function panel  Type II diabetes mellitus with manifestations ([place])- Her blood sugar is adequately well controlled. -     Basic metabolic panel; Future -     Hemoglobin A1c; Future -     Hepatic function panel; Future -     Basic metabolic panel -     Hemoglobin A1c -     Hepatic function panel  Flu vaccine need -     Flu Vaccine QUAD High Dose(Fluad)  Stage 3b chronic kidney disease ([place])- Her renal function is stable.  She will avoid nephrotoxic agents.   I have discontinued Brodie I. Sausedo's triamterene-hydrochlorothiazide. I am also having her maintain her Ascorbic Acid (VITAMIN C PO), Cholecalciferol (VITAMIN D PO), rosuvastatin, amLODipine, potassium  chloride, losartan, levothyroxine, and meloxicam.  No orders of the defined types were placed in this encounter.    Follow-up: No follow-ups on file.  Scarlette Calico, MD

## 2020-11-19 NOTE — Patient Instructions (Signed)
Hypothyroidism  Hypothyroidism is when the thyroid gland does not make enough of certain hormones (it is underactive). The thyroid gland is a small gland located in the lower front part of the neck, just in front of the windpipe (trachea). This gland makes hormones that help control how the body uses food for energy (metabolism) as well as how the heart and brain function. These hormones also play a role in keeping your bones strong. When the thyroid is underactive, it produces too little of the hormones thyroxine (T4) and triiodothyronine (T3). What are the causes? This condition may be caused by:  Hashimoto's disease. This is a disease in which the body's disease-fighting system (immune system) attacks the thyroid gland. This is the most common cause.  Viral infections.  Pregnancy.  Certain medicines.  Birth defects.  Past radiation treatments to the head or neck for cancer.  Past treatment with radioactive iodine.  Past exposure to radiation in the environment.  Past surgical removal of part or all of the thyroid.  Problems with a gland in the center of the brain (pituitary gland).  Lack of enough iodine in the diet. What increases the risk? You are more likely to develop this condition if:  You are female.  You have a family history of thyroid conditions.  You use a medicine called lithium.  You take medicines that affect the immune system (immunosuppressants). What are the signs or symptoms? Symptoms of this condition include:  Feeling as though you have no energy (lethargy).  Not being able to tolerate cold.  Weight gain that is not explained by a change in diet or exercise habits.  Lack of appetite.  Dry skin.  Coarse hair.  Menstrual irregularity.  Slowing of thought processes.  Constipation.  Sadness or depression. How is this diagnosed? This condition may be diagnosed based on:  Your symptoms, your medical history, and a physical exam.  Blood  tests. You may also have imaging tests, such as an ultrasound or MRI. How is this treated? This condition is treated with medicine that replaces the thyroid hormones that your body does not make. After you begin treatment, it may take several weeks for symptoms to go away. Follow these instructions at home:  Take over-the-counter and prescription medicines only as told by your health care provider.  If you start taking any new medicines, tell your health care provider.  Keep all follow-up visits as told by your health care provider. This is important. ? As your condition improves, your dosage of thyroid hormone medicine may change. ? You will need to have blood tests regularly so that your health care provider can monitor your condition. Contact a health care provider if:  Your symptoms do not get better with treatment.  You are taking thyroid hormone replacement medicine and you: ? Sweat a lot. ? Have tremors. ? Feel anxious. ? Lose weight rapidly. ? Cannot tolerate heat. ? Have emotional swings. ? Have diarrhea. ? Feel weak. Get help right away if you have:  Chest pain.  An irregular heartbeat.  A rapid heartbeat.  Difficulty breathing. Summary  Hypothyroidism is when the thyroid gland does not make enough of certain hormones (it is underactive).  When the thyroid is underactive, it produces too little of the hormones thyroxine (T4) and triiodothyronine (T3).  The most common cause is Hashimoto's disease, a disease in which the body's disease-fighting system (immune system) attacks the thyroid gland. The condition can also be caused by viral infections, medicine, pregnancy, or   past radiation treatment to the head or neck.  Symptoms may include weight gain, dry skin, constipation, feeling as though you do not have energy, and not being able to tolerate cold.  This condition is treated with medicine to replace the thyroid hormones that your body does not make. This  information is not intended to replace advice given to you by your health care provider. Make sure you discuss any questions you have with your health care provider. Document Revised: 07/12/2020 Document Reviewed: 06/27/2020 [employer] Patient Education  2021 [employer]  

## 2020-11-27 ENCOUNTER — Other Ambulatory Visit: Payer: Self-pay | Admitting: Internal Medicine

## 2020-11-27 DIAGNOSIS — E7800 Pure hypercholesterolemia, unspecified: Secondary | ICD-10-CM

## 2021-01-07 ENCOUNTER — Other Ambulatory Visit: Payer: Self-pay | Admitting: Internal Medicine

## 2021-01-21 ENCOUNTER — Other Ambulatory Visit: Payer: Self-pay | Admitting: Internal Medicine

## 2021-01-21 DIAGNOSIS — E118 Type 2 diabetes mellitus with unspecified complications: Secondary | ICD-10-CM

## 2021-01-21 DIAGNOSIS — I10 Essential (primary) hypertension: Secondary | ICD-10-CM

## 2021-03-26 ENCOUNTER — Other Ambulatory Visit: Payer: Self-pay | Admitting: Internal Medicine

## 2021-03-26 DIAGNOSIS — E039 Hypothyroidism, unspecified: Secondary | ICD-10-CM

## 2021-04-04 ENCOUNTER — Other Ambulatory Visit: Payer: Self-pay | Admitting: Internal Medicine

## 2021-04-04 DIAGNOSIS — M19012 Primary osteoarthritis, left shoulder: Secondary | ICD-10-CM

## 2021-05-12 ENCOUNTER — Ambulatory Visit: Payer: Medicare Other | Admitting: Internal Medicine

## 2021-05-12 ENCOUNTER — Ambulatory Visit: Payer: Medicare Other

## 2021-06-06 ENCOUNTER — Other Ambulatory Visit: Payer: Self-pay | Admitting: Internal Medicine

## 2021-06-06 DIAGNOSIS — E7800 Pure hypercholesterolemia, unspecified: Secondary | ICD-10-CM

## 2021-06-10 ENCOUNTER — Ambulatory Visit (INDEPENDENT_AMBULATORY_CARE_PROVIDER_SITE_OTHER): Payer: Medicare Other | Admitting: Internal Medicine

## 2021-06-10 ENCOUNTER — Encounter: Payer: Self-pay | Admitting: Internal Medicine

## 2021-06-10 ENCOUNTER — Other Ambulatory Visit: Payer: Self-pay

## 2021-06-10 VITALS — BP 132/78 | HR 99 | Temp 98.1°F | Ht 62.0 in | Wt 127.0 lb

## 2021-06-10 DIAGNOSIS — E039 Hypothyroidism, unspecified: Secondary | ICD-10-CM

## 2021-06-10 DIAGNOSIS — Z0000 Encounter for general adult medical examination without abnormal findings: Secondary | ICD-10-CM | POA: Diagnosis not present

## 2021-06-10 DIAGNOSIS — I10 Essential (primary) hypertension: Secondary | ICD-10-CM

## 2021-06-10 DIAGNOSIS — E118 Type 2 diabetes mellitus with unspecified complications: Secondary | ICD-10-CM

## 2021-06-10 DIAGNOSIS — E7800 Pure hypercholesterolemia, unspecified: Secondary | ICD-10-CM

## 2021-06-10 DIAGNOSIS — N1832 Chronic kidney disease, stage 3b: Secondary | ICD-10-CM | POA: Diagnosis not present

## 2021-06-10 LAB — CBC WITH DIFFERENTIAL/PLATELET
Basophils Absolute: 0.1 10*3/uL (ref 0.0–0.1)
Basophils Relative: 0.9 % (ref 0.0–3.0)
Eosinophils Absolute: 0.1 10*3/uL (ref 0.0–0.7)
Eosinophils Relative: 0.6 % (ref 0.0–5.0)
HCT: 45.7 % (ref 36.0–46.0)
Hemoglobin: 15.1 g/dL — ABNORMAL HIGH (ref 12.0–15.0)
Lymphocytes Relative: 31.6 % (ref 12.0–46.0)
Lymphs Abs: 3.3 10*3/uL (ref 0.7–4.0)
MCHC: 33.1 g/dL (ref 30.0–36.0)
MCV: 93 fl (ref 78.0–100.0)
Monocytes Absolute: 0.7 10*3/uL (ref 0.1–1.0)
Monocytes Relative: 6.5 % (ref 3.0–12.0)
Neutro Abs: 6.3 10*3/uL (ref 1.4–7.7)
Neutrophils Relative %: 60.4 % (ref 43.0–77.0)
Platelets: 250 10*3/uL (ref 150.0–400.0)
RBC: 4.91 Mil/uL (ref 3.87–5.11)
RDW: 14.4 % (ref 11.5–15.5)
WBC: 10.4 10*3/uL (ref 4.0–10.5)

## 2021-06-10 LAB — LIPID PANEL
Cholesterol: 127 mg/dL (ref 0–200)
HDL: 43.1 mg/dL (ref >39.00)
LDL Cholesterol: 63 mg/dL (ref 0–99)
NonHDL: 83.89
Total CHOL/HDL Ratio: 3
Triglycerides: 104 mg/dL (ref 0.0–149.0)
VLDL: 20.8 mg/dL (ref 0.0–40.0)

## 2021-06-10 LAB — BASIC METABOLIC PANEL
BUN: 17 mg/dL (ref 6–23)
CO2: 22 mEq/L (ref 19–32)
Calcium: 10.9 mg/dL — ABNORMAL HIGH (ref 8.4–10.5)
Chloride: 107 mEq/L (ref 96–112)
Creatinine, Ser: 1.14 mg/dL (ref 0.40–1.20)
GFR: 43.77 mL/min — ABNORMAL LOW (ref >60.00)
Glucose, Bld: 122 mg/dL — ABNORMAL HIGH (ref 70–99)
Potassium: 4 mEq/L (ref 3.5–5.1)
Sodium: 139 mEq/L (ref 135–145)

## 2021-06-10 LAB — TSH: TSH: 5.86 u[IU]/mL — ABNORMAL HIGH (ref 0.35–5.50)

## 2021-06-10 LAB — HEPATIC FUNCTION PANEL
ALT: 26 U/L (ref 0–35)
AST: 22 U/L (ref 0–37)
Albumin: 4.8 g/dL (ref 3.5–5.2)
Alkaline Phosphatase: 79 U/L (ref 39–117)
Bilirubin, Direct: 0.1 mg/dL (ref 0.0–0.3)
Total Bilirubin: 0.7 mg/dL (ref 0.2–1.2)
Total Protein: 8.2 g/dL (ref 6.0–8.3)

## 2021-06-10 LAB — HEMOGLOBIN A1C: Hgb A1c MFr Bld: 6.5 % (ref 4.6–6.5)

## 2021-06-10 NOTE — Progress Notes (Signed)
Subjective:  Patient ID: Emilio Aspen, female    DOB: Nov 27, 1932  Age: 87 y.o. MRN: [id number]  CC: Annual Exam, Hypothyroidism, and Hyperlipidemia  This visit occurred during the SARS-CoV-2 public health emergency.  Safety protocols were in place, including screening questions prior to the visit, additional usage of staff PPE, and extensive cleaning of exam room while observing appropriate contact time as indicated for disinfecting solutions.    HPI IFEOLUWA BARTZ presents for a CPX and f/up -  She tells me that she is doing well and offers no new complaints.  Outpatient Medications Prior to Visit  Medication Sig Dispense Refill   amLODipine (NORVASC) 10 MG tablet TAKE ONE TABLET DAILY 90 tablet 1   Ascorbic Acid (VITAMIN C PO) Take 1 tablet by mouth daily.     Cholecalciferol (VITAMIN D PO) Take 1 tablet by mouth daily.     levothyroxine (SYNTHROID) 50 MCG tablet TAKE ONE TABLET DAILY 90 tablet 1   losartan (COZAAR) 50 MG tablet TAKE ONE TABLET EACH DAY 90 tablet 1   meloxicam (MOBIC) 7.5 MG tablet TAKE ONE TABLET DAILY 90 tablet 1   potassium chloride (KLOR-CON) 10 MEQ tablet TAKE ONE TABLET EACH DAY 90 tablet 1   rosuvastatin (CRESTOR) 10 MG tablet TAKE ONE TABLET DAILY 90 tablet 1   No facility-administered medications prior to visit.    ROS Review of Systems  Constitutional:  Negative for diaphoresis and fatigue.  HENT: Negative.    Eyes: Negative.   Respiratory:  Negative for cough, shortness of breath and wheezing.   Cardiovascular:  Negative for chest pain, palpitations and leg swelling.  Gastrointestinal:  Negative for abdominal pain, constipation, diarrhea, nausea and vomiting.  Endocrine: Negative.  Negative for cold intolerance and heat intolerance.  Genitourinary: Negative.  Negative for difficulty urinating and dysuria.  Musculoskeletal:  Positive for gait problem.  Skin: Negative.  Negative for color change and pallor.  Neurological:  Negative for  dizziness, weakness and light-headedness.  Hematological:  Negative for adenopathy. Does not bruise/bleed easily.  Psychiatric/Behavioral:  Positive for confusion and decreased concentration. Negative for sleep disturbance. The patient is not nervous/anxious.    Objective:  BP 132/78 (BP Location: Left Arm, Patient Position: Sitting, Cuff Size: Normal)   Pulse 99   Temp 98.1 F (36.7 C) (Oral)   Ht 5\' 2"  (1.575 m)   Wt 127 lb (57.6 kg)   SpO2 99%   BMI 23.23 kg/m   BP Readings from Last 3 Encounters:  06/10/21 132/78  11/19/20 132/78  05/15/20 140/76    Wt Readings from Last 3 Encounters:  06/10/21 127 lb (57.6 kg)  11/19/20 124 lb (56.2 kg)  05/15/20 119 lb 4 oz (54.1 kg)    Physical Exam Vitals reviewed.  HENT:     Nose: Nose normal.     Mouth/Throat:     Mouth: Mucous membranes are moist.  Eyes:     Conjunctiva/sclera: Conjunctivae normal.  Cardiovascular:     Rate and Rhythm: Normal rate and regular rhythm.     Heart sounds: No murmur heard. Pulmonary:     Effort: Pulmonary effort is normal.     Breath sounds: No stridor. No wheezing, rhonchi or rales.  Abdominal:     General: Abdomen is flat. Bowel sounds are normal. There is no distension.     Palpations: Abdomen is soft. There is no hepatomegaly, splenomegaly or mass.     Tenderness: There is no abdominal tenderness.  Musculoskeletal:  General: Normal range of motion.     Cervical back: Neck supple.     Right lower leg: No edema.     Left lower leg: No edema.  Lymphadenopathy:     Cervical: No cervical adenopathy.  Skin:    General: Skin is warm and dry.  Neurological:     General: No focal deficit present.     Mental Status: She is alert.  Psychiatric:        Mood and Affect: Mood normal.        Behavior: Behavior normal.    Lab Results  Component Value Date   WBC 10.4 06/10/2021   HGB 15.1 (H) 06/10/2021   HCT 45.7 06/10/2021   PLT 250.0 06/10/2021   GLUCOSE 122 (H) 06/10/2021   CHOL  127 06/10/2021   TRIG 104.0 06/10/2021   HDL 43.10 06/10/2021   LDLCALC 63 06/10/2021   ALT 26 06/10/2021   AST 22 06/10/2021   NA 139 06/10/2021   K 4.0 06/10/2021   CL 107 06/10/2021   CREATININE 1.14 06/10/2021   BUN 17 06/10/2021   CO2 22 06/10/2021   TSH 5.86 (H) 06/10/2021   INR 1.0 05/16/2009   HGBA1C 6.5 06/10/2021   MICROALBUR 0.9 06/13/2019    MR Brain Wo Contrast  Result Date: 12/20/2018 CLINICAL DATA:  Falling over the last week.  Confusion and weakness. EXAM: MRI HEAD WITHOUT CONTRAST TECHNIQUE: Multiplanar, multiecho pulse sequences of the brain and surrounding structures were obtained without intravenous contrast. COMPARISON:  07/11/2013.  01/13/2012. FINDINGS: Brain: Diffusion imaging does not show any acute or subacute infarction. There chronic small-vessel ischemic changes of the pons. No focal cerebellar insult. Cerebral hemispheres show old small vessel infarctions in the left lateral thalamus and left basal ganglia. There is an old right posterior frontal cortical and subcortical infarction. There are mild chronic small-vessel ischemic changes of the deep white matter. There is some hemosiderin deposition in the region of the old infarctions in the left thalamus and basal ganglia. There could be a developmental venous anomaly in that region. No sign of mass, acute hemorrhage, hydrocephalus or extra-axial collection. Vascular: Major vessels at the base of the brain show flow. Skull and upper cervical spine: Chronic arthropathy at the C1-2 articulation but without encroachment upon the spinal canal. Sinuses/Orbits: Clear/normal Other: None IMPRESSION: No acute or reversible finding. Chronic small-vessel ischemic changes of the pons and cerebral hemispheric white matter. 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 I wonder if there is a developmental venous anomaly in this region as well. If so, this is obviously lifelong  and would not be specifically treatable in any way. Electronically Signed   By: Nelson Chimes M.D.   On: 12/20/2018 08:01    Assessment & Plan:   Maryjo was seen today for annual exam, hypothyroidism and hyperlipidemia.  Diagnoses and all orders for this visit:  Primary hypertension- Her blood pressure is adequately well controlled. -     CBC with Differential/Platelet; Future -     Basic metabolic panel; Future -     Basic metabolic panel -     CBC with Differential/Platelet  Stage 3b chronic kidney disease ([place])- Her creatinine clearance is 28.5.  Her blood pressure is adequately well controlled.  She agrees to avoid nephrotoxic agents. -     CBC with Differential/Platelet; Future -     Basic metabolic panel; Future -     Basic metabolic panel -  CBC with Differential/Platelet  Acquired hypothyroidism- Her TSH is in an acceptable range.  She will stay on the current dose of levothyroxine. -     TSH; Future -     TSH  Hypercalcemia- Her Ca++ level is stable and she is asx. -     Basic metabolic panel; Future -     Basic metabolic panel  Routine general medical examination at a health care facility- Exam completed, labs reviewed, vaccines reviewed, no cancer screenings indicated, patient education was given.  Hypercholesteremia- LDL goal achieved. Doing well on the statin  -     Lipid panel; Future -     Hepatic function panel; Future -     Hepatic function panel -     Lipid panel  Type II diabetes mellitus with manifestations ([place])- Her blood sugar is adequately well controlled. -     Basic metabolic panel; Future -     Hemoglobin A1c; Future -     Hemoglobin A1c -     Basic metabolic panel  I am having Kataleena I. Crimi maintain her Ascorbic Acid (VITAMIN C PO), Cholecalciferol (VITAMIN D PO), amLODipine, potassium chloride, losartan, levothyroxine, meloxicam, and rosuvastatin.  No orders of the defined types were placed in this encounter.    Follow-up: No  follow-ups on file.  Scarlette Calico, MD

## 2021-06-13 ENCOUNTER — Other Ambulatory Visit: Payer: Self-pay | Admitting: Internal Medicine

## 2021-06-26 NOTE — Progress Notes (Signed)
Subjective:   Jackie Horton is a 87 y.o. female who presents for Medicare Annual (Subsequent) preventive examination. I connected with  Emilio Aspen on 06/27/21 by an audio only telemedicine application and verified that I am speaking with the correct person using two identifiers.   I discussed the limitations, risks, security and privacy concerns of performing an evaluation and management service by telephone and the availability of in person appointments. I also discussed with the patient that there may be a patient responsible charge related to this service. The patient expressed understanding and verbally consented to this telephonic visit.  Location of Patient: Home  Location of Provider: Office  List any persons and their role that are participating in the visit with the patient.  Micheline Maze, Leighton Ruff, CMA       Objective:    There were no vitals filed for this visit. There is no height or weight on file to calculate BMI.  Advanced Directives 06/27/2021 06/27/2021 06/27/2021 05/10/2020 12/13/2018 08/17/2017 07/08/2016  Does Patient Have a Medical Advance Directive? Yes - Yes Yes Yes Yes No  Type of Programmer, systems - - Healthcare Power of [place];Living will [place];Living will -  Does patient want to make changes to medical advance directive? No - Patient declined No - Patient declined No - Patient declined No - Patient declined - - -  Copy of [place] in Chart? No - copy requested - - - No - copy requested No - copy requested -  Would patient like information on creating a medical advance directive? - - - - - - No - patient declined information    Current Medications (verified) Outpatient Encounter Medications as of 06/27/2021  Medication Sig   amLODipine (NORVASC) 10 MG tablet TAKE ONE TABLET DAILY   Ascorbic Acid (VITAMIN C PO) Take 1 tablet by mouth daily.    Cholecalciferol (VITAMIN D PO) Take 1 tablet by mouth daily.   levothyroxine (SYNTHROID) 50 MCG tablet TAKE ONE TABLET DAILY   losartan (COZAAR) 50 MG tablet TAKE ONE TABLET EACH DAY   meloxicam (MOBIC) 7.5 MG tablet TAKE ONE TABLET DAILY   potassium chloride (KLOR-CON) 10 MEQ tablet TAKE ONE TABLET EACH DAY   rosuvastatin (CRESTOR) 10 MG tablet TAKE ONE TABLET DAILY   No facility-administered encounter medications on file as of 06/27/2021.    Allergies (verified) Patient has no known allergies.   History: Past Medical History:  Diagnosis Date   Breast cancer ([place])    Cigarette smoker    Constipation    Hypercholesteremia    Hypertension    Hypothyroid    Osteoarthritis    Past Surgical History:  Procedure Laterality Date   bladder tact  2011   BREAST LUMPECTOMY  2009   PARTIAL HYSTERECTOMY  1972   ROTATOR CUFF REPAIR     Family History  Problem Relation Age of Onset   Aneurysm Father    Breast cancer Sister        3 sisters   Breast cancer Daughter    Social History   Socioeconomic History   Marital status: Single    Spouse name: Not on file   Number of children: 3   Years of education: 11   Highest education level: Not on file  Occupational History   Occupation: caregiver    Employer: RETIRED  Tobacco Use   Smoking status: Some Days    Packs/day: 0.25  Types: Cigarettes    Last attempt to quit: 10/14/2015    Years since quitting: 5.7   Smokeless tobacco: Never  Vaping Use   Vaping Use: Never used  Substance and Sexual Activity   Alcohol use: No    Alcohol/week: 0.0 standard drinks   Drug use: No   Sexual activity: Not Currently    Birth control/protection: Post-menopausal, Surgical  Other Topics Concern   Not on file  Social History Narrative   2 sisters with breast cancer   Friend--Howard Burch   Social Determinants of Health   Financial Resource Strain: Low Risk    Difficulty of Paying Living Expenses: Not hard at all  Food Insecurity: No  Food Insecurity   Worried About Charity fundraiser in the Last Year: Never true   Ran Out of Food in the Last Year: Never true  Transportation Needs: No Transportation Needs   Lack of Transportation (Medical): No   Lack of Transportation (Non-Medical): No  Physical Activity: Insufficiently Active   Days of Exercise per Week: 1 day   Minutes of Exercise per Session: 20 min  Stress: No Stress Concern Present   Feeling of Stress : Not at all  Social Connections: Moderately Isolated   Frequency of Communication with Friends and Family: More than three times a week   Frequency of Social Gatherings with Friends and Family: Once a week   Attends Religious Services: 1 to 4 times per year   Active Member of [employer] or Organizations: No   Attends Music therapist: Never   Marital Status: Divorced    Tobacco Counseling Ready to quit: No Counseling given: No   Clinical Intake:  Pre-visit preparation completed: Yes  Pain : No/denies pain     Diabetes: No  How often do you need to have someone help you when you read instructions, pamphlets, or other written materials from your doctor or pharmacy?: 3 - Sometimes  Diabetic? No   Interpreter Needed?: No  Information entered by :: Leighton Ruff, CMA   Activities of Daily Living In your present state of health, do you have any difficulty performing the following activities: 06/10/2021  Hearing? N  Vision? N  Difficulty concentrating or making decisions? N  Walking or climbing stairs? N  Dressing or bathing? N  Doing errands, shopping? Y  Some recent data might be hidden    Patient Care Team: Janith Lima, MD as PCP - General (Internal Medicine)  Indicate any recent Medical Services you may have received from other than [facility] providers in the past year (date may be approximate).     Assessment:   This is a routine wellness examination for [name].  Hearing/Vision screen No results found.  Dietary issues  and exercise activities discussed:     Goals Addressed   None   Depression Screen PHQ 2/9 Scores 06/27/2021 06/10/2021 05/10/2020 06/13/2019 12/13/2018 08/17/2017 08/10/2017  PHQ - 2 Score 0 0 0 1 1 1  0  PHQ- 9 Score - - - 4 4 1  -  Exception Documentation - - - - - - -    Fall Risk Fall Risk  06/10/2021 05/15/2020 05/10/2020 06/13/2019 12/15/2018  Falls in the past year? 0 0 0 0 1  Number falls in past yr: 0 0 0 0 1  Injury with Fall? 0 0 0 0 1  Risk for fall due to : Impaired balance/gait Impaired mobility;Impaired balance/gait;Mental status change No Fall Risks - History of fall(s);Impaired balance/gait;Mental status change  Follow  up - - Falls evaluation completed;Education provided Falls evaluation completed Falls evaluation completed;Follow up appointment;Education provided;Falls prevention discussed    FALL RISK PREVENTION PERTAINING TO THE HOME:  Any stairs in or around the home? No  If so, are there any without handrails? N/A Home free of loose throw rugs in walkways, pet beds, electrical cords, etc? Yes  Adequate lighting in your home to reduce risk of falls? Yes   ASSISTIVE DEVICES UTILIZED TO PREVENT FALLS:  Life alert? No  Use of a cane, walker or w/c? No  Grab bars in the bathroom? No  Shower chair or bench in shower? Yes  Elevated toilet seat or a handicapped toilet? No   TIMED UP AND GO:  Was the test performed? N/A Length of time to ambulate 10 feet: N/A    Cognitive Function: MMSE - Mini Mental State Exam 12/13/2018 08/17/2017  Not completed: Refused -  Orientation to time - 4  Orientation to Place - 5  Registration - 3  Attention/ Calculation - 3  Recall - 2  Language- name 2 objects - 2  Language- repeat - 1  Language- follow 3 step command - 3  Language- read & follow direction - 1  Write a sentence - 1  Copy design - 1  Total score - 26     6CIT Screen 05/10/2020  What Year? 0 points  What month? 0 points  What time? 3 points  Count back from 20  0 points  Months in reverse 0 points  Repeat phrase 10 points  Total Score 13    Immunizations Immunization History  Administered Date(s) Administered   Fluad Quad(high Dose 65+) 09/12/2019, 11/19/2020   Influenza Split 07/25/2012   Influenza, High Dose Seasonal PF 10/14/2015, 07/06/2016, 08/10/2017, 12/13/2018   Influenza,inj,Quad PF,6+ Mos 07/20/2013, 10/08/2014   Moderna Sars-Covid-2 Vaccination 01/30/2020, 02/27/2020   Pneumococcal Conjugate-13 02/21/2014   Pneumococcal Polysaccharide-23 03/16/2012, 08/10/2017   Tdap 02/21/2014    TDAP status: Up to date  Flu Vaccine status: Up to date  Pneumococcal vaccine status: Up to date  Covid-19 Vaccine Status: Up to date  Qualifies for Shingles Vaccine? Patient does not know. Zostavax completed No, patient states she does not wish to.   Screening Tests Health Maintenance  Topic Date Due   Zoster Vaccines- Shingrix (1 of 2) Never done   DEXA SCAN  Never done   OPHTHALMOLOGY EXAM  10/08/2016   FOOT EXAM  03/13/2020   COVID-19 Vaccine (3 - Moderna risk series) 03/26/2020   INFLUENZA VACCINE  05/26/2021   HEMOGLOBIN A1C  12/11/2021   TETANUS/TDAP  02/22/2024   PNA vac Low Risk Adult  Completed   HPV VACCINES  Aged Out    Health Maintenance  Health Maintenance Due  Topic Date Due   Zoster Vaccines- Shingrix (1 of 2) Never done   DEXA SCAN  Never done   OPHTHALMOLOGY EXAM  10/08/2016   FOOT EXAM  03/13/2020   COVID-19 Vaccine (3 - Moderna risk series) 03/26/2020   INFLUENZA VACCINE  05/26/2021    Colorectal cancer screening: No longer required.   Mammogram Status:  Completed 09/26/19. Needs repeat 1 year  Bone Density: Never done  Lung Cancer Screening: (Low Dose CT Chest recommended if Age 85-80 years, 30 pack-year currently smoking OR have quit w/in 15years.) does not qualify.   Lung Cancer Screening Referral: No  Additional Screening:  Hepatitis C Screening: does not qualify; Completed No  Vision  Screening: Recommended annual ophthalmology exams for early detection  of glaucoma and other disorders of the eye. Is the patient up to date with their annual eye exam?  No  Who is the provider or what is the name of the office in which the patient attends annual eye exams? Patient does not have one. . If pt is not established with a provider, would they like to be referred to a provider to establish care? No .   Dental Screening: Recommended annual dental exams for proper oral hygiene  Community Resource Referral / Chronic Care Management: CRR required this visit?  No   CCM required this visit?  No      Plan:     I have personally reviewed and noted the following in the patient's chart:   Medical and social history Use of alcohol, tobacco or illicit drugs  Current medications and supplements including opioid prescriptions.  Functional ability and status Nutritional status Physical activity Advanced directives List of other physicians Hospitalizations, surgeries, and ER visits in previous 12 months Vitals Screenings to include cognitive, depression, and falls Referrals and appointments  In addition, I have reviewed and discussed with patient certain preventive protocols, quality metrics, and best practice recommendations. A written personalized care plan for preventive services as well as general preventive health recommendations were provided to patient.     Lowella Grip, [place]   06/27/2021   Nurse Notes: Non-Face to Face 51 minutes visit Encounter.  Ms. Derhammer , Thank you for taking time to come for your Medicare Wellness Visit. I appreciate your ongoing commitment to your health goals. Please review the following plan we discussed and let me know if I can assist you in the future.   These are the goals we discussed:  Goals      Maintain current health status     Stay as healthy and as independent as possible     Patient Stated     Stay in my home and stay as  independent as possible.        This is a list of the screening recommended for you and due dates:  Health Maintenance  Topic Date Due   Zoster (Shingles) Vaccine (1 of 2) Never done   DEXA scan (bone density measurement)  Never done   Eye exam for diabetics  10/08/2016   Complete foot exam   03/13/2020   COVID-19 Vaccine (3 - Moderna risk series) 03/26/2020   Flu Shot  05/26/2021   Hemoglobin A1C  12/11/2021   Tetanus Vaccine  02/22/2024   Pneumonia vaccines  Completed   HPV Vaccine  Aged Out

## 2021-06-27 ENCOUNTER — Ambulatory Visit (INDEPENDENT_AMBULATORY_CARE_PROVIDER_SITE_OTHER): Payer: Medicare Other | Admitting: Internal Medicine

## 2021-06-27 DIAGNOSIS — Z0000 Encounter for general adult medical examination without abnormal findings: Secondary | ICD-10-CM | POA: Diagnosis not present

## 2021-07-07 ENCOUNTER — Other Ambulatory Visit: Payer: Self-pay | Admitting: Internal Medicine

## 2021-07-30 ENCOUNTER — Other Ambulatory Visit: Payer: Self-pay | Admitting: Internal Medicine

## 2021-07-30 DIAGNOSIS — E118 Type 2 diabetes mellitus with unspecified complications: Secondary | ICD-10-CM

## 2021-07-30 DIAGNOSIS — I10 Essential (primary) hypertension: Secondary | ICD-10-CM

## 2021-10-04 ENCOUNTER — Other Ambulatory Visit: Payer: Self-pay | Admitting: Internal Medicine

## 2021-10-04 DIAGNOSIS — E039 Hypothyroidism, unspecified: Secondary | ICD-10-CM

## 2021-10-14 ENCOUNTER — Other Ambulatory Visit: Payer: Self-pay | Admitting: Internal Medicine

## 2021-10-14 DIAGNOSIS — M19012 Primary osteoarthritis, left shoulder: Secondary | ICD-10-CM

## 2021-12-02 ENCOUNTER — Other Ambulatory Visit: Payer: Self-pay | Admitting: Internal Medicine

## 2021-12-02 DIAGNOSIS — E7800 Pure hypercholesterolemia, unspecified: Secondary | ICD-10-CM

## 2021-12-18 ENCOUNTER — Ambulatory Visit (INDEPENDENT_AMBULATORY_CARE_PROVIDER_SITE_OTHER): Payer: Medicare Other | Admitting: Internal Medicine

## 2021-12-18 ENCOUNTER — Other Ambulatory Visit: Payer: Self-pay

## 2021-12-18 ENCOUNTER — Encounter: Payer: Self-pay | Admitting: Internal Medicine

## 2021-12-18 VITALS — BP 124/76 | HR 98 | Temp 98.0°F | Resp 16 | Ht 62.0 in | Wt 125.0 lb

## 2021-12-18 DIAGNOSIS — N184 Chronic kidney disease, stage 4 (severe): Secondary | ICD-10-CM | POA: Diagnosis not present

## 2021-12-18 DIAGNOSIS — N1832 Chronic kidney disease, stage 3b: Secondary | ICD-10-CM | POA: Diagnosis not present

## 2021-12-18 DIAGNOSIS — I10 Essential (primary) hypertension: Secondary | ICD-10-CM | POA: Diagnosis not present

## 2021-12-18 DIAGNOSIS — E118 Type 2 diabetes mellitus with unspecified complications: Secondary | ICD-10-CM | POA: Diagnosis not present

## 2021-12-18 DIAGNOSIS — E039 Hypothyroidism, unspecified: Secondary | ICD-10-CM | POA: Diagnosis not present

## 2021-12-18 DIAGNOSIS — Z23 Encounter for immunization: Secondary | ICD-10-CM

## 2021-12-18 LAB — CBC WITH DIFFERENTIAL/PLATELET
Basophils Absolute: 0.1 10*3/uL (ref 0.0–0.1)
Basophils Relative: 0.7 % (ref 0.0–3.0)
Eosinophils Absolute: 0.1 10*3/uL (ref 0.0–0.7)
Eosinophils Relative: 0.8 % (ref 0.0–5.0)
HCT: 45.6 % (ref 36.0–46.0)
Hemoglobin: 14.9 g/dL (ref 12.0–15.0)
Lymphocytes Relative: 34 % (ref 12.0–46.0)
Lymphs Abs: 3.8 10*3/uL (ref 0.7–4.0)
MCHC: 32.6 g/dL (ref 30.0–36.0)
MCV: 93 fl (ref 78.0–100.0)
Monocytes Absolute: 0.6 10*3/uL (ref 0.1–1.0)
Monocytes Relative: 5.3 % (ref 3.0–12.0)
Neutro Abs: 6.7 10*3/uL (ref 1.4–7.7)
Neutrophils Relative %: 59.2 % (ref 43.0–77.0)
Platelets: 254 10*3/uL (ref 150.0–400.0)
RBC: 4.9 Mil/uL (ref 3.87–5.11)
RDW: 14.5 % (ref 11.5–15.5)
WBC: 11.3 10*3/uL — ABNORMAL HIGH (ref 4.0–10.5)

## 2021-12-18 LAB — BASIC METABOLIC PANEL
BUN: 22 mg/dL (ref 6–23)
CO2: 25 mEq/L (ref 19–32)
Calcium: 10.7 mg/dL — ABNORMAL HIGH (ref 8.4–10.5)
Chloride: 107 mEq/L (ref 96–112)
Creatinine, Ser: 1.26 mg/dL — ABNORMAL HIGH (ref 0.40–1.20)
GFR: 38.67 mL/min — ABNORMAL LOW (ref >60.00)
Glucose, Bld: 99 mg/dL (ref 70–99)
Potassium: 4.1 mEq/L (ref 3.5–5.1)
Sodium: 140 mEq/L (ref 135–145)

## 2021-12-18 LAB — HEMOGLOBIN A1C: Hgb A1c MFr Bld: 6.7 % — ABNORMAL HIGH (ref 4.6–6.5)

## 2021-12-18 LAB — TSH: TSH: 13.21 u[IU]/mL — ABNORMAL HIGH (ref 0.35–5.50)

## 2021-12-18 NOTE — Progress Notes (Signed)
Subjective:  Patient ID: Jackie Horton, female    DOB: [birth year]  Age: 88 y.o. MRN: [id number]  CC: Hypothyroidism and Diabetes  This visit occurred during the SARS-CoV-2 public health emergency.  Safety protocols were in place, including screening questions prior to the visit, additional usage of staff PPE, and extensive cleaning of exam room while observing appropriate contact time as indicated for disinfecting solutions.    HPI LOGYN DEDOMINICIS presents for f/up -   It is difficult to get much history from her but she indicates that she is doing well and offers no complaints.  She tells me she is taking her medications.  Outpatient Medications Prior to Visit  Medication Sig Dispense Refill   amLODipine (NORVASC) 10 MG tablet TAKE ONE TABLET DAILY 90 tablet 1   Ascorbic Acid (VITAMIN C PO) Take 1 tablet by mouth daily.     Cholecalciferol (VITAMIN D PO) Take 1 tablet by mouth daily.     losartan (COZAAR) 50 MG tablet TAKE ONE TABLET EACH DAY 90 tablet 1   potassium chloride (KLOR-CON) 10 MEQ tablet TAKE ONE TABLET EACH DAY 90 tablet 1   rosuvastatin (CRESTOR) 10 MG tablet TAKE ONE TABLET DAILY 90 tablet 0   levothyroxine (SYNTHROID) 50 MCG tablet TAKE ONE TABLET DAILY 90 tablet 1   meloxicam (MOBIC) 7.5 MG tablet TAKE ONE TABLET DAILY 90 tablet 1   No facility-administered medications prior to visit.    ROS Review of Systems  All other systems reviewed and are negative.  Objective:  BP 124/76 (BP Location: Left Arm, Patient Position: Sitting, Cuff Size: Large)    Pulse 98    Temp 98 F (36.7 C) (Oral)    Resp 16    Ht 5\' 2"  (1.575 m)    Wt 125 lb (56.7 kg)    SpO2 96%    BMI 22.86 kg/m   BP Readings from Last 3 Encounters:  12/18/21 124/76  06/10/21 132/78  11/19/20 132/78    Wt Readings from Last 3 Encounters:  12/18/21 125 lb (56.7 kg)  06/10/21 127 lb (57.6 kg)  11/19/20 124 lb (56.2 kg)    Physical Exam Constitutional:      Appearance: She is not  ill-appearing.  HENT:     Nose: Nose normal.     Mouth/Throat:     Mouth: Mucous membranes are moist.  Eyes:     General: No scleral icterus.    Conjunctiva/sclera: Conjunctivae normal.  Cardiovascular:     Rate and Rhythm: Regular rhythm.     Heart sounds: No murmur heard. Pulmonary:     Breath sounds: No stridor. No wheezing, rhonchi or rales.  Abdominal:     General: Abdomen is flat.     Palpations: There is no mass.     Tenderness: There is no abdominal tenderness. There is no guarding.     Hernia: No hernia is present.  Musculoskeletal:     Cervical back: Neck supple.     Right lower leg: No edema.     Left lower leg: No edema.  Lymphadenopathy:     Cervical: No cervical adenopathy.  Skin:    General: Skin is warm and dry.  Neurological:     General: No focal deficit present.     Mental Status: She is alert.  Psychiatric:        Mood and Affect: Mood normal.        Behavior: Behavior normal.    Lab Results  Component Value  Date   WBC 11.3 (H) 12/18/2021   HGB 14.9 12/18/2021   HCT 45.6 12/18/2021   PLT 254.0 12/18/2021   GLUCOSE 99 12/18/2021   CHOL 127 06/10/2021   TRIG 104.0 06/10/2021   HDL 43.10 06/10/2021   LDLCALC 63 06/10/2021   ALT 26 06/10/2021   AST 22 06/10/2021   NA 140 12/18/2021   K 4.1 12/18/2021   CL 107 12/18/2021   CREATININE 1.26 (H) 12/18/2021   BUN 22 12/18/2021   CO2 25 12/18/2021   TSH 13.21 (H) 12/18/2021   INR 1.0 05/16/2009   HGBA1C 6.7 (H) 12/18/2021   MICROALBUR 0.9 06/13/2019    MR Brain Wo Contrast  Result Date: 12/20/2018 CLINICAL DATA:  Falling over the last week.  Confusion and weakness. EXAM: MRI HEAD WITHOUT CONTRAST TECHNIQUE: Multiplanar, multiecho pulse sequences of the brain and surrounding structures were obtained without intravenous contrast. COMPARISON:  07/11/2013.  01/13/2012. FINDINGS: Brain: Diffusion imaging does not show any acute or subacute infarction. There chronic small-vessel ischemic changes of the  pons. No focal cerebellar insult. Cerebral hemispheres show old small vessel infarctions in the left lateral thalamus and left basal ganglia. There is an old right posterior frontal cortical and subcortical infarction. There are mild chronic small-vessel ischemic changes of the deep white matter. There is some hemosiderin deposition in the region of the old infarctions in the left thalamus and basal ganglia. There could be a developmental venous anomaly in that region. No sign of mass, acute hemorrhage, hydrocephalus or extra-axial collection. Vascular: Major vessels at the base of the brain show flow. Skull and upper cervical spine: Chronic arthropathy at the C1-2 articulation but without encroachment upon the spinal canal. Sinuses/Orbits: Clear/normal Other: None IMPRESSION: No acute or reversible finding. Chronic small-vessel ischemic changes of the pons and cerebral hemispheric white matter. 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 I wonder if there is a developmental venous anomaly in this region as well. If so, this is obviously lifelong and would not be specifically treatable in any way. Electronically Signed   By: Nelson Chimes M.D.   On: 12/20/2018 08:01    Assessment & Plan:   Declan was seen today for hypothyroidism and diabetes.  Diagnoses and all orders for this visit:  Acquired hypothyroidism- Her TSH is up to 13.  Will increase her T4 dosage. -     CBC with Differential/Platelet; Future -     TSH; Future -     TSH -     CBC with Differential/Platelet -     levothyroxine (SYNTHROID) 50 MCG tablet; Take 1 tablet (50 mcg total) by mouth every other day. -     levothyroxine (SYNTHROID) 75 MCG tablet; Take 1 tablet (75 mcg total) by mouth every other day.  Type II diabetes mellitus with manifestations ([place])- Her blood sugar is adequately well controlled. -     Basic metabolic panel; Future -     Hemoglobin A1c; Future -      Hemoglobin A1c -     Basic metabolic panel  Primary hypertension- Her blood pressure is adequately well controlled. -     CBC with Differential/Platelet; Future -     CBC with Differential/Platelet  Stage 3b chronic kidney disease ([place])- Her renal function is stable. -     CBC with Differential/Platelet; Future -     CBC with Differential/Platelet  Hypercalcemia- Her calcium level has drifted down slightly. -  Basic metabolic panel; Future -     Basic metabolic panel  Chronic renal disease, stage 4, severely decreased glomerular filtration rate (GFR) between 15-29 mL/min/1.73 square meter (HCC) -     AMB Referral to [facility]  Need for shingles vaccine -     Zoster Vaccine Adjuvanted [facility]) injection; Inject 0.5 mLs into the muscle once for 1 dose.   I have discontinued Latia I. Meroney's meloxicam. I have also changed her levothyroxine. Additionally, I am having her start on levothyroxine and Shingrix. Lastly, I am having her maintain her Ascorbic Acid (VITAMIN C PO), Cholecalciferol (VITAMIN D PO), amLODipine, potassium chloride, losartan, and rosuvastatin.  Meds ordered this encounter  Medications   levothyroxine (SYNTHROID) 50 MCG tablet    Sig: Take 1 tablet (50 mcg total) by mouth every other day.    Dispense:  45 tablet    Refill:  0   levothyroxine (SYNTHROID) 75 MCG tablet    Sig: Take 1 tablet (75 mcg total) by mouth every other day.    Dispense:  45 tablet    Refill:  0   Zoster Vaccine Adjuvanted [facility]) injection    Sig: Inject 0.5 mLs into the muscle once for 1 dose.    Dispense:  0.5 mL    Refill:  1     Follow-up: Return in about 6 months (around 06/17/2022).  Scarlette Calico, MD

## 2021-12-18 NOTE — Patient Instructions (Signed)
Type 2 Diabetes Mellitus, Diagnosis, Adult ?Type 2 diabetes (type 2 diabetes mellitus) is a long-term, or chronic, disease. In type 2 diabetes, one or both of these problems may be present: ?The pancreas does not make enough of a hormone called insulin. ?Cells in the body do not respond properly to the insulin that the body makes (insulin resistance). ?Normally, insulin allows blood sugar (glucose) to enter cells in the body. The cells use glucose for energy. Insulin resistance or lack of insulin causes excess glucose to build up in the blood instead of going into cells. This causes high blood glucose (hyperglycemia).  ?What are the causes? ?The exact cause of type 2 diabetes is not known. ?What increases the risk? ?The following factors may make you more likely to develop this condition: ?Having a family member with type 2 diabetes. ?Being overweight or obese. ?Being inactive (sedentary). ?Having been diagnosed with insulin resistance. ?Having a history of prediabetes, diabetes when you were pregnant (gestational diabetes), or polycystic ovary syndrome (PCOS). ?What are the signs or symptoms? ?In the early stage of this condition, you may not have symptoms. Symptoms develop slowly and may include: ?Increased thirst or hunger. ?Increased urination. ?Unexplained weight loss. ?Tiredness (fatigue) or weakness. ?Vision changes, such as blurry vision. ?Dark patches on the skin. ?How is this diagnosed? ?This condition is diagnosed based on your symptoms, your medical history, a physical exam, and your blood glucose level. Your blood glucose may be checked with one or more of the following blood tests: ?A fasting blood glucose (FBG) test. You will not be allowed to eat (you will fast) for 8 hours or longer before a blood sample is taken. ?A random blood glucose test. This test checks blood glucose at any time of day regardless of when you ate. ?An A1C (hemoglobin A1C) blood test. This test provides information about blood  glucose levels over the previous 2-3 months. ?An oral glucose tolerance test (OGTT). This test measures your blood glucose at two times: ?After fasting. This is your baseline blood glucose level. ?Two hours after drinking a beverage that contains glucose. ?You may be diagnosed with type 2 diabetes if: ?Your fasting blood glucose level is 126 mg/dL (7.0 mmol/L) or higher. ?Your random blood glucose level is 200 mg/dL (11.1 mmol/L) or higher. ?Your A1C level is 6.5% or higher. ?Your oral glucose tolerance test result is higher than 200 mg/dL (11.1 mmol/L). ?These blood tests may be repeated to confirm your diagnosis. ?How is this treated? ?Your treatment may be managed by a specialist called an endocrinologist. Type 2 diabetes may be treated by following instructions from your health care provider about: ?Making dietary and lifestyle changes. These may include: ?Following a personalized nutrition plan that is developed by a registered dietitian. ?Exercising regularly. ?Finding ways to manage stress. ?Checking your blood glucose level as often as told. ?Taking diabetes medicines or insulin daily. This helps to keep your blood glucose levels in the healthy range. ?Taking medicines to help prevent complications from diabetes. Medicines may include: ?Aspirin. ?Medicine to lower cholesterol. ?Medicine to control blood pressure. ?Your health care provider will set treatment goals for you. Your goals will be based on your age, other medical conditions you have, and how you respond to diabetes treatment. Generally, the goal of treatment is to maintain the following blood glucose levels: ?Before meals: 80-130 mg/dL (4.4-7.2 mmol/L). ?After meals: below 180 mg/dL (10 mmol/L). ?A1C level: less than 7%. ?Follow these instructions at home: ?Questions to ask your health care provider ?  Consider asking the following questions: ?Should I meet with a certified diabetes care and education specialist? ?What diabetes medicines do I need,  and when should I take them? ?What equipment will I need to manage my diabetes at home? ?How often do I need to check my blood glucose? ?Where can I find a support group for people with diabetes? ?What number can I call if I have questions? ?When is my next appointment? ?General instructions ?Take over-the-counter and prescription medicines only as told by your health care provider. ?Keep all follow-up visits. This is important. ?Where to find more information ?For help and guidance and for more information about diabetes, please visit: ?American Diabetes Association (ADA): www.diabetes.org ?American Association of Diabetes Care and Education Specialists (ADCES): www.diabeteseducator.org ?International Diabetes Federation (IDF): www.idf.org ?Contact a health care provider if: ?Your blood glucose is at or above 240 mg/dL (13.3 mmol/L) for 2 days in a row. ?You have been sick or have had a fever for 2 days or longer, and you are not getting better. ?You have any of the following problems for more than 6 hours: ?You cannot eat or drink. ?You have nausea and vomiting. ?You have diarrhea. ?Get help right away if: ?You have severe hypoglycemia. This means your blood glucose is lower than 54 mg/dL (3.0 mmol/L). ?You become confused or you have trouble thinking clearly. ?You have difficulty breathing. ?You have moderate or large ketone levels in your urine. ?These symptoms may represent a serious problem that is an emergency. Do not wait to see if the symptoms will go away. Get medical help right away. Call your local emergency services (911 in the [place] Do not drive yourself to the hospital. ?Summary ?Type 2 diabetes mellitus is a long-term, or chronic, disease. In type 2 diabetes, the pancreas does not make enough of a hormone called insulin, or cells in the body do not respond properly to insulin that the body makes. ?This condition is treated by making dietary and lifestyle changes and taking diabetes medicines or  insulin. ?Your health care provider will set treatment goals for you. Your goals will be based on your age, other medical conditions you have, and how you respond to diabetes treatment. ?Keep all follow-up visits. This is important. ?This information is not intended to replace advice given to you by your health care provider. Make sure you discuss any questions you have with your health care provider. ?Document Revised: 01/06/2021 Document Reviewed: 01/06/2021 ?Elsevier Patient Education ? 2022 Elsevier Inc. ? ?

## 2021-12-19 ENCOUNTER — Encounter: Payer: Self-pay | Admitting: Internal Medicine

## 2021-12-19 DIAGNOSIS — N184 Chronic kidney disease, stage 4 (severe): Secondary | ICD-10-CM | POA: Insufficient documentation

## 2021-12-19 MED ORDER — LEVOTHYROXINE SODIUM 75 MCG PO TABS
75.0000 ug | ORAL_TABLET | ORAL | 0 refills | Status: DC
Start: 2021-12-19 — End: 2022-02-17

## 2021-12-19 MED ORDER — LEVOTHYROXINE SODIUM 50 MCG PO TABS: 50.0000 ug | ORAL_TABLET | ORAL | 0 refills | Status: DC

## 2021-12-19 MED ORDER — SHINGRIX 50 MCG/0.5ML IM SUSR
0.5000 mL | Freq: Once | INTRAMUSCULAR | 1 refills | Status: AC
Start: 2021-12-19 — End: 2021-12-19

## 2021-12-23 ENCOUNTER — Telehealth: Payer: Self-pay | Admitting: *Deleted

## 2021-12-23 NOTE — Chronic Care Management (AMB) (Signed)
Chronic Care Management   Note  12/23/2021 Name: Jackie Horton MRN: [id number] DOB: [birth year]  Jackie Horton is a 88 y.o. year old female who is a primary care patient of Janith Lima, MD. I reached out to Emilio Aspen by phone today in response to a referral sent by [name]'s PCP.  [name] was given information about Chronic Care Management services today including:  CCM service includes personalized support from designated clinical staff supervised by her physician, including individualized plan of care and coordination with other care providers 24/7 contact phone numbers for assistance for urgent and routine care needs. Service will only be billed when office clinical staff spend 20 minutes or more in a month to coordinate care. Only one practitioner may furnish and bill the service in a calendar month. The patient may stop CCM services at any time (effective at the end of the month) by phone call to the office staff. The patient is responsible for co-pay (up to 20% after annual deductible is met) if co-pay is required by the individual health plan.   Patient agreed to services and verbal consent obtained.   Follow up plan: Telephone appointment with care management team member scheduled for: SW 12/24/21 and [facility] 12/25/21  [place] [facility]  Direct Dial: [phone]

## 2021-12-24 ENCOUNTER — Ambulatory Visit (INDEPENDENT_AMBULATORY_CARE_PROVIDER_SITE_OTHER): Payer: Medicare Other | Admitting: Licensed Clinical Social Worker

## 2021-12-24 DIAGNOSIS — N184 Chronic kidney disease, stage 4 (severe): Secondary | ICD-10-CM

## 2021-12-24 DIAGNOSIS — I10 Essential (primary) hypertension: Secondary | ICD-10-CM

## 2021-12-24 DIAGNOSIS — Z139 Encounter for screening, unspecified: Secondary | ICD-10-CM

## 2021-12-24 NOTE — Chronic Care Management (AMB) (Signed)
?  Chronic Care Management  ? Clinical Social Work Note ? ?12/24/2021 ?Name: Jackie Horton MRN: [id number] DOB: [birth year] ? ?Jackie Horton is a 88 y.o. year old female who is a primary care patient of Janith Lima, MD. The CCM team was consulted to assist the patient with chronic disease management and/or care coordination needs related to: [employer]  and Dementia Care Counseling .  ? ?Engaged with patient by telephone for initial visit in response to provider referral for social work chronic care management and care coordination services.  ? ?Consent to Services:  ?The patient was given the following information about Chronic Care Management services today, agreed to services, and gave verbal consent: 1. CCM service includes personalized support from designated clinical staff supervised by the primary care provider, including individualized plan of care and coordination with other care providers 2. 24/7 contact phone numbers for assistance for urgent and routine care needs. 3. Service will only be billed when office clinical staff spend 20 minutes or more in a month to coordinate care. 4. Only one practitioner may furnish and bill the service in a calendar month. 5.The patient may stop CCM services at any time (effective at the end of the month) by phone call to the office staff. 6. The patient will be responsible for cost sharing (co-pay) of up to 20% of the service fee (after annual deductible is met). Patient agreed to services and consent obtained. ? ?Patient agreed to services and consent obtained.  ? ?Assessment: Assessed patient's previous and current treatment, coping skills, support system and barriers to care. Please note , patient is hard of hearing she is proud that she is able to meet all ADL's, cooks her own food and has support from son and daughter daily.  Based on assessment patient denies having any needs.  Referral referenced dementia care counseling for patient , however their  is no dementia diagnosis in the chart,  LCSW unable to address this issue . No Care Plan was developed during this encounter bases on no needs identified.  ? ?Recent life changes [place]: Patient denies  ? ?Interventions:Active listening / Reflection utilized  ; Review various resources patient denied need,   ? ?Recommendation: Patient may benefit from, and is in agreement to talk with CCM RN this week. After assessment from CCM RN will collaborate based on need. ? ?Follow up Plan:  ?CCM LCSW will continue to collaborate with CCM RN in order to meet patient's needs . ?No follow up scheduled with CCM LCSW at this time.  ? ?Assessment: Review of patient past medical history, allergies, medications, and health status, including review of relevant consultants reports was performed today as part of a comprehensive evaluation and provision of chronic care management and care coordination services.    ? ?SDOH (Social Determinants of Health) assessments and interventions performed:  ?SDOH Interventions   ? ?Flowsheet Row Most Recent Value  ?SDOH Interventions   ?Financial Strain Interventions Intervention Not Indicated  ? ?  ?  ? ?Advanced Directives Status:  Patient reports she has an advance directive and son Jackie Horton is Jackie Horton. ? ? ?Casimer Lanius, LCSW ?Licensed Clinical Social Worker [facility]  ?[place]  ?[phone]  ? ? ? ? ? ? ? ? ? ?

## 2021-12-24 NOTE — Patient Instructions (Addendum)
Visit Information  ? ?Thank you for taking time to talk with me today. Please don't hesitate to contact me if I can be of assistance to you before our next scheduled telephone appointment. ? ?Following are the goals we discussed today:  General Assessment of your needs ?During this encounter no needs were identified based on information you share and you denied having any needs.   ? ?Please call the care guide team at [phone] if you need to cancel or reschedule your appointment.  ? ?If you are experiencing a Mental Health or [place] or need someone to talk to, please call 1-800-273-TALK (toll free, 24 hour hotline)  ? ?No care plan was developed during this encounter:  ? ?Consent to CCM Services: ?Ms. Siwik was given information about Chronic Care Management services including:  ?CCM service includes personalized support from designated clinical staff supervised by her physician, including individualized plan of care and coordination with other care providers ?24/7 contact phone numbers for assistance for urgent and routine care needs. ?Service will only be billed when office clinical staff spend 20 minutes or more in a month to coordinate care. ?Only one practitioner may furnish and bill the service in a calendar month. ?The patient may stop CCM services at any time (effective at the end of the month) by phone call to the office staff. ?The patient will be responsible for cost sharing (co-pay) of up to 20% of the service fee (after annual deductible is met). ? ?Patient agreed to services and verbal consent obtained.  ? ?The patient verbalized understanding of instructions, educational materials, and care plan provided today and agreed to receive a mailed copy of patient instructions, educational materials, and care plan.  ? ?No follow up scheduled, per our conversation you do not desire continued follow up ?Per our conversation I will remain part of your care team for the next 60 days.  If no  needs are identified in the next 60 days, I will disconnect from the care team. ? ?Casimer Lanius, LCSW ?Licensed Clinical Social Worker [facility]  ?[place]  ?[phone]  ? ?  ?

## 2021-12-25 ENCOUNTER — Ambulatory Visit: Payer: Medicare Other | Admitting: *Deleted

## 2021-12-25 DIAGNOSIS — N184 Chronic kidney disease, stage 4 (severe): Secondary | ICD-10-CM

## 2021-12-25 DIAGNOSIS — I10 Essential (primary) hypertension: Secondary | ICD-10-CM

## 2021-12-25 NOTE — Patient Instructions (Signed)
Visit [place], thank you for taking time to talk with me today. Please don't hesitate to contact me if I can be of assistance to you before our next scheduled telephone appointment  Below are the goals we discussed today:  Patient Self-Care Activities: Patient Jackie Horton will: Take medications as prescribed Attend all scheduled provider appointments Call pharmacy for medication refills Call provider office for new concerns or questions Continue to follow heart healthy, low salt, low cholesterol diet Keep up the great work preventing falls Review enclosed educational material   Our next scheduled telephone follow up visit/ appointment is scheduled on: Tuesday, Mar 24, 2022 at 9:45 am- This is a PHONE CALL appointment  If you need to cancel or re-schedule our visit, please call [phone] and our care guide team will be happy to assist you.   I look forward to hearing about your progress.   Oneta Rack, RN, BSN, [place] [phone]: direct office  If you are experiencing a Mental Health or [place] or need someone to talk to, please  call the Suicide and Crisis Lifeline: 988 call the [place] National Suicide Prevention Lifeline: 801-296-9582 or TTY: 6800807975 TTY 4351007969) to talk to a trained counselor call 1-800-273-TALK (toll free, 24 hour hotline) go to [facility] [address] [place] [phone]) call 63   Healthy Eating Following a healthy eating pattern may help you to achieve and maintain a healthy body weight, reduce the risk of chronic disease, and live a long and productive life. It is important to follow a healthy eating pattern at an appropriate calorie level for your body. Your nutritional needs should be met primarily through food by choosing a variety of nutrient-rich foods. What are tips for following this  plan? Reading food labels Read labels and choose the following: Reduced or low sodium. Juices with 100% fruit juice. Foods with low saturated fats and high polyunsaturated and monounsaturated fats. Foods with whole grains, such as whole wheat, cracked wheat, brown rice, and wild rice. Whole grains that are fortified with folic acid. This is recommended for women who are pregnant or who want to become pregnant. Read labels and avoid the following: Foods with a lot of added sugars. These include foods that contain brown sugar, corn sweetener, corn syrup, dextrose, fructose, glucose, high-fructose corn syrup, honey, invert sugar, lactose, malt syrup, maltose, molasses, raw sugar, sucrose, trehalose, or turbinado sugar. Do not eat more than the following amounts of added sugar per day: 6 teaspoons (25 g) for women. 9 teaspoons (38 g) for men. Foods that contain processed or refined starches and grains. Refined grain products, such as white flour, degermed cornmeal, white bread, and white rice. Shopping Choose nutrient-rich snacks, such as vegetables, whole fruits, and nuts. Avoid high-calorie and high-sugar snacks, such as potato chips, fruit snacks, and candy. Use oil-based dressings and spreads on foods instead of solid fats such as butter, stick margarine, or cream cheese. Limit pre-made sauces, mixes, and "instant" products such as flavored rice, instant noodles, and ready-made pasta. Try more plant-protein sources, such as tofu, tempeh, black beans, edamame, lentils, nuts, and seeds. Explore eating plans such as the Mediterranean diet or vegetarian diet. Cooking Use oil to saut or stir-fry foods instead of solid fats such as butter, stick margarine, or lard. Try baking, boiling, grilling, or broiling instead of frying. Remove the fatty part of meats before cooking. Steam vegetables in water  or broth. Meal planning  At meals, imagine dividing your plate into fourths: One-half of your  plate is fruits and vegetables. One-fourth of your plate is whole grains. One-fourth of your plate is protein, especially lean meats, poultry, eggs, tofu, beans, or nuts. Include low-fat dairy as part of your daily diet. Lifestyle Choose healthy options in all settings, including home, work, school, restaurants, or stores. Prepare your food safely: Wash your hands after handling raw meats. Keep food preparation surfaces clean by regularly washing with hot, soapy water. Keep raw meats separate from ready-to-eat foods, such as fruits and vegetables. Cook seafood, meat, poultry, and eggs to the recommended internal temperature. Store foods at safe temperatures. In general: Keep cold foods at 44F (4.4C) or below. Keep hot foods at 144F (60C) or above. Keep your freezer at [facility] (-17.8C) or below. Foods are no longer safe to eat when they have been between the temperatures of 40-144F (4.4-60C) for more than 2 hours. What foods should I eat? Fruits Aim to eat 2 cup-equivalents of fresh, canned (in natural juice), or frozen fruits each day. Examples of 1 cup-equivalent of fruit include 1 small apple, 8 large strawberries, 1 cup canned fruit,  cup dried fruit, or 1 cup 100% juice. Vegetables Aim to eat 2-3 cup-equivalents of fresh and frozen vegetables each day, including different varieties and colors. Examples of 1 cup-equivalent of vegetables include 2 medium carrots, 2 cups raw, leafy greens, 1 cup chopped vegetable (raw or cooked), or 1 medium baked potato. Grains Aim to eat 6 ounce-equivalents of whole grains each day. Examples of 1 ounce-equivalent of grains include 1 slice of bread, 1 cup ready-to-eat cereal, 3 cups popcorn, or  cup cooked rice, pasta, or cereal. Meats and other proteins Aim to eat 5-6 ounce-equivalents of protein each day. Examples of 1 ounce-equivalent of protein include 1 egg, 1/2 cup nuts or seeds, or 1 tablespoon (16 g) peanut butter. A cut of meat or fish that  is the size of a deck of cards is about 3-4 ounce-equivalents. Of the protein you eat each week, try to have at least 8 ounces come from seafood. This includes salmon, trout, herring, and anchovies. Dairy Aim to eat 3 cup-equivalents of fat-free or low-fat dairy each day. Examples of 1 cup-equivalent of dairy include 1 cup (240 mL) milk, 8 ounces (250 g) yogurt, 1 ounces (44 g) natural cheese, or 1 cup (240 mL) fortified soy milk. Fats and oils Aim for about 5 teaspoons (21 g) per day. Choose monounsaturated fats, such as canola and olive oils, avocados, peanut butter, and most nuts, or polyunsaturated fats, such as sunflower, corn, and soybean oils, walnuts, pine nuts, sesame seeds, sunflower seeds, and flaxseed. Beverages Aim for six 8-oz glasses of water per day. Limit coffee to three to five 8-oz cups per day. Limit caffeinated beverages that have added calories, such as soda and energy drinks. Limit alcohol intake to no more than 1 drink a day for nonpregnant women and 2 drinks a day for men. One drink equals 12 oz of beer (355 mL), 5 oz of wine (148 mL), or 1 oz of hard liquor (44 mL). Seasoning and other foods Avoid adding excess amounts of salt to your foods. Try flavoring foods with herbs and spices instead of salt. Avoid adding sugar to foods. Try using oil-based dressings, sauces, and spreads instead of solid fats. This information is based on general [place] nutrition guidelines. For more information, visit [employer]. Exact amounts may vary based on  your nutrition needs. Summary A healthy eating plan may help you to maintain a healthy weight, reduce the risk of chronic diseases, and stay active throughout your life. Plan your meals. Make sure you eat the right portions of a variety of nutrient-rich foods. Try baking, boiling, grilling, or broiling instead of frying. Choose healthy options in all settings, including home, work, school, restaurants, or stores. This information is  not intended to replace advice given to you by your health care provider. Make sure you discuss any questions you have with your health care provider. Document Revised: 06/10/2021 Document Reviewed: 06/10/2021 Elsevier Patient Education  2022 [place] is a copy of your full care plan:  Care Plan : RN Care Manager Plan of Care  Updates made by Knox Royalty, RN since 12/25/2021 12:00 AM     Problem: Chronic disease management needs   Priority: High     Long-Range Goal: Development of plan of care for long term chronic disease management   Start Date: 12/25/2021  Expected End Date: 12/26/2022  Priority: High  Note:   Current Barriers:  Chronic Disease Management support and education needs related to HTN and CKD Stage IV  RNCM Clinical Goal(s):  Patient will demonstrate ongoing health management independence as evidenced by adherence to plan of care for HTN/ CKD        through collaboration with RN Care manager, provider, and care team.   Interventions: 1:1 collaboration with primary care provider regarding development and update of comprehensive plan of care as evidenced by provider attestation and co-signature Inter-disciplinary care team collaboration (see longitudinal plan of care) Evaluation of current treatment plan related to  self management and patient's adherence to plan as established by provider 12/25/21: CCM RN CM Initial assessment completed SDOH assessment completed: no current SDOH concerns/ unmet needs identified Pain Assessment completed: denies acute/ chronic pain Falls assessment completed: patient reports "have never had a fall;" states feels steady on feet with ambulation, does not use assistive devices; positive reinforcement provided with encouragement to continue efforts Basic education provided around fall risks/ prevention strategies at home Reviewed most recent PCP office visit 12/18/21 with patient: she denies questions and verbalizes overall  good understanding of post-visit instructions Discussed medications with patient: she endorses complete adherence to all medications; reports her son fills weekly pill box and she then takes each day independently; denies concerns/ issues/ problems with medications Has not yet received updated (new) dosing for levothyroxine post- recent PCP office visit 12/18/21-- reviewed with patient today Reviewed upcoming scheduled provider appointments: PCP- June 15, 2022   Chronic Kidney Disease (Status: 12/25/21: New goal.)  Long Term Goal ; Hypertension: (Status: 12/25/21: New goal.) Long Term Goal   Last practice recorded BP readings:  BP Readings from Last 3 Encounters:  12/18/21 124/76  06/10/21 132/78  11/19/20 132/78  Most recent eGFR/CrCl: No results found for: EGFR  No components found for: CRCL  Assessed the patient     understanding of chronic kidney disease    Evaluation of current treatment plan related to chronic kidney disease self management and patient's adherence to plan as established by provider      Reviewed prescribed diet heart healthy, low salt, low cholesterol Discussed plans with patient for ongoing care management follow up and provided patient with direct contact information for care management team    Patient denies current issues with kidney function today; briefly reviewed lab values and basic pathophysiology of CKD in setting of advanced age Confirmed  patient independent in toileting: reports "I make plenty of urine"  Evaluation of current treatment plan related to hypertension self management and patient's adherence to plan as established by provider;   Provided education to patient re: stroke prevention, s/s of heart attack and stroke; Discussed complications of poorly controlled blood pressure such as heart disease, stroke, circulatory complications, vision complications, kidney impairment, sexual dysfunction;  Confirmed patient does not monitor blood pressures or  weights at home; states "no need to," "I feel great and am doing great"  Confirmed patient continues "occasionally" using tobacco; states "every now and then I will have a cigarette;" she declines further quantification: states "not very often;" not interested in resources for cessation  Patient Goals/Self-Care Activities: As evidenced by review of EHR, collaboration with care team, and patient reporting during CCM RN CM outreach,  Patient Jackie Horton will: Take medications as prescribed Attend all scheduled provider appointments Call pharmacy for medication refills Call provider office for new concerns or questions Continue to follow heart healthy, low salt, low cholesterol diet Keep up the great work preventing falls Review enclosed educational material       Consent to CCM Services: Jackie Horton was given information about Chronic Care Management services 12/23/21 including:  CCM service includes personalized support from designated clinical staff supervised by her physician, including individualized plan of care and coordination with other care providers 24/7 contact phone numbers for assistance for urgent and routine care needs. Service will only be billed when office clinical staff spend 20 minutes or more in a month to coordinate care. Only one practitioner may furnish and bill the service in a calendar month. The patient may stop CCM services at any time (effective at the end of the month) by phone call to the office staff. The patient will be responsible for cost sharing (co-pay) of up to 20% of the service fee (after annual deductible is met).  Patient agreed to services and verbal consent obtained.   The patient verbalized understanding of instructions, educational materials, and care plan provided today and agreed to receive a mailed copy of patient instructions, educational materials, and care plan Telephone follow up appointment with care management team member scheduled for:   Tuesday, Mar 24, 2022 at 9:45 am The patient has been provided with contact information for the care management team and has been advised to call with any health related questions or concerns

## 2021-12-25 NOTE — Chronic Care Management (AMB) (Signed)
Chronic Care Management   CCM RN Visit Note  12/25/2021 Name: Jackie Horton MRN: [id number] DOB: 23-Dec-1932  Subjective: Jackie Horton is a 88 y.o. year old female who is a primary care patient of Janith Lima, MD. The care management team was consulted for assistance with disease management and care coordination needs.    Engaged with patient by telephone for initial visit in response to provider referral for case management and/or care coordination services.   Consent to Services:  The patient was given information about Chronic Care Management services, agreed to services, and gave verbal consent 12/23/21 prior to initiation of services.  Please see initial visit note for detailed documentation.  Patient agreed to services and verbal consent obtained.   Assessment: Review of patient past medical history, allergies, medications, health status, including review of consultants reports, laboratory and other test data, was performed as part of comprehensive evaluation and provision of chronic care management services.   SDOH (Social Determinants of Health) assessments and interventions performed:  SDOH Interventions    Flowsheet Row Most Recent Value  SDOH Interventions   Food Insecurity Interventions Intervention Not Indicated  [Denies food insecurity,  denies current assistance with food acquisition]  Housing Interventions Intervention Not Indicated  [lives alone, single family one level home/ owns/ "lived here for my whole adult life, " denies safety concerns,  son lives "5 minutes away"]  Transportation Interventions Intervention Not Indicated  [Patient does not drive- reports her son provides transportation to provider appointments/ errands]     CCM Care Plan  No Known Allergies  Outpatient Encounter Medications as of 12/25/2021  Medication Sig   amLODipine (NORVASC) 10 MG tablet TAKE ONE TABLET DAILY   Ascorbic Acid (VITAMIN C PO) Take 1 tablet by mouth daily.    Cholecalciferol (VITAMIN D PO) Take 1 tablet by mouth daily.   levothyroxine (SYNTHROID) 50 MCG tablet Take 1 tablet (50 mcg total) by mouth every other day.   levothyroxine (SYNTHROID) 75 MCG tablet Take 1 tablet (75 mcg total) by mouth every other day.   losartan (COZAAR) 50 MG tablet TAKE ONE TABLET EACH DAY   potassium chloride (KLOR-CON) 10 MEQ tablet TAKE ONE TABLET EACH DAY   rosuvastatin (CRESTOR) 10 MG tablet TAKE ONE TABLET DAILY   No facility-administered encounter medications on file as of 12/25/2021.   Patient Active Problem List   Diagnosis Date Noted   Chronic renal disease, stage 4, severely decreased glomerular filtration rate (GFR) between 15-29 mL/min/1.73 square meter ([place]) 12/19/2021   Flu vaccine need 11/19/2020   Stage 3b chronic kidney disease ([place]) 11/19/2020   Primary osteoarthritis of both shoulders 09/12/2019   Sensorineural hearing loss (SNHL) of both ears 12/13/2018   Cerebellar ataxia in diseases classified elsewhere ([place]) 12/13/2018   Depression with somatization 07/22/2016   Hypercalcemia 06/11/2015   Type II diabetes mellitus with manifestations ([place]) 02/21/2014   Routine general medical examination at a health care facility 02/21/2014   COPD with chronic bronchitis ([place]) 11/17/2013   DJD (degenerative joint disease) 05/15/2011   Osteopenia 05/15/2011   Constipation 02/01/2011   Hypertension 01/28/2011   Hypercholesteremia 01/28/2011   Breast cancer ([place]) 01/28/2011   Hypothyroid 01/28/2011   Cigarette smoker 01/28/2011   Conditions to be addressed/monitored:  HTN and CKD Stage IV  Care Plan : RN Care Manager Plan of Care  Updates made by Knox Royalty, RN since 12/25/2021 12:00 AM     Problem: Chronic disease management needs   Priority: High  Long-Range Goal: Development of plan of care for long term chronic disease management   Start Date: 12/25/2021  Expected End Date: 12/26/2022  Priority: High  Note:   Current Barriers:  Chronic  Disease Management support and education needs related to HTN and CKD Stage IV  RNCM Clinical Goal(s):  Patient will demonstrate ongoing health management independence as evidenced by adherence to plan of care for HTN/ CKD        through collaboration with RN Care manager, provider, and care team.   Interventions: 1:1 collaboration with primary care provider regarding development and update of comprehensive plan of care as evidenced by provider attestation and co-signature Inter-disciplinary care team collaboration (see longitudinal plan of care) Evaluation of current treatment plan related to  self management and patient's adherence to plan as established by provider 12/25/21: CCM RN CM Initial assessment completed SDOH assessment completed: no current SDOH concerns/ unmet needs identified Pain Assessment completed: denies acute/ chronic pain Falls assessment completed: patient reports "have never had a fall;" states feels steady on feet with ambulation, does not use assistive devices; positive reinforcement provided with encouragement to continue efforts Basic education provided around fall risks/ prevention strategies at home Reviewed most recent PCP office visit 12/18/21 with patient: she denies questions and verbalizes overall good understanding of post-visit instructions Discussed medications with patient: she endorses complete adherence to all medications; reports her son fills weekly pill box and she then takes each day independently; denies concerns/ issues/ problems with medications Has not yet received updated (new) dosing for levothyroxine post- recent PCP office visit 12/18/21-- reviewed with patient today Reviewed upcoming scheduled provider appointments: PCP- June 15, 2022   Chronic Kidney Disease (Status: 12/25/21: New goal.)  Long Term Goal ; Hypertension: (Status: 12/25/21: New goal.) Long Term Goal   Last practice recorded BP readings:  BP Readings from Last 3 Encounters:   12/18/21 124/76  06/10/21 132/78  11/19/20 132/78  Most recent eGFR/CrCl: No results found for: EGFR  No components found for: CRCL  Assessed the patient     understanding of chronic kidney disease    Evaluation of current treatment plan related to chronic kidney disease self management and patient's adherence to plan as established by provider      Reviewed prescribed diet heart healthy, low salt, low cholesterol Discussed plans with patient for ongoing care management follow up and provided patient with direct contact information for care management team    Patient denies current issues with kidney function today; briefly reviewed lab values and basic pathophysiology of CKD in setting of advanced age Confirmed patient independent in toileting: reports "I make plenty of urine"  Evaluation of current treatment plan related to hypertension self management and patient's adherence to plan as established by provider;   Provided education to patient re: stroke prevention, s/s of heart attack and stroke; Discussed complications of poorly controlled blood pressure such as heart disease, stroke, circulatory complications, vision complications, kidney impairment, sexual dysfunction;  Confirmed patient does not monitor blood pressures or weights at home; states "no need to," "I feel great and am doing great"  Confirmed patient continues "occasionally" using tobacco; states "every now and then I will have a cigarette;" she declines further quantification: states "not very often;" not interested in resources for cessation  Patient Goals/Self-Care Activities: As evidenced by review of EHR, collaboration with care team, and patient reporting during CCM RN CM outreach,  Patient [place] will: Take medications as prescribed Attend all scheduled provider appointments Call pharmacy for  medication refills Call provider office for new concerns or questions Continue to follow heart healthy, low salt, low  cholesterol diet Keep up the great work preventing falls Review enclosed educational material       Plan: Telephone follow up appointment with care management team member scheduled for:  Tuesday, Mar 24, 2022 at 9:45 am The patient has been provided with contact information for the care management team and has been advised to call with any health related questions or concerns  Oneta Rack, RN, BSN, [place] [phone]: direct office

## 2021-12-26 ENCOUNTER — Other Ambulatory Visit: Payer: Self-pay | Admitting: Internal Medicine

## 2022-01-23 DIAGNOSIS — I10 Essential (primary) hypertension: Secondary | ICD-10-CM

## 2022-01-23 DIAGNOSIS — N184 Chronic kidney disease, stage 4 (severe): Secondary | ICD-10-CM

## 2022-01-28 ENCOUNTER — Other Ambulatory Visit: Payer: Self-pay | Admitting: Internal Medicine

## 2022-01-28 DIAGNOSIS — E118 Type 2 diabetes mellitus with unspecified complications: Secondary | ICD-10-CM

## 2022-01-28 DIAGNOSIS — I10 Essential (primary) hypertension: Secondary | ICD-10-CM

## 2022-02-17 ENCOUNTER — Telehealth: Payer: Self-pay | Admitting: Internal Medicine

## 2022-02-17 ENCOUNTER — Other Ambulatory Visit: Payer: Self-pay | Admitting: Internal Medicine

## 2022-02-17 DIAGNOSIS — E039 Hypothyroidism, unspecified: Secondary | ICD-10-CM

## 2022-02-17 MED ORDER — LEVOTHYROXINE SODIUM 50 MCG PO TABS: 50.0000 ug | ORAL_TABLET | ORAL | 0 refills | Status: DC

## 2022-02-17 MED ORDER — LEVOTHYROXINE SODIUM 75 MCG PO TABS
75.0000 ug | ORAL_TABLET | ORAL | 0 refills | Status: DC
Start: 2022-02-17 — End: 2022-03-09

## 2022-02-17 NOTE — Telephone Encounter (Signed)
PT's son visits today in regards to a medication refill. Refill is for Levothyroxine SYNTHROID 75 mcg . If possible they would like this prescription sent out to [facility]!  ? ?CB: [phone] ? ?Also FYI PT's son believes that PT might have been taking the medication daily instead of every other day which might have caused the prescription to run short. But PT's son will be distributing medication for his mom for now on.  ?

## 2022-02-25 ENCOUNTER — Other Ambulatory Visit: Payer: Self-pay | Admitting: Internal Medicine

## 2022-02-25 DIAGNOSIS — E7800 Pure hypercholesterolemia, unspecified: Secondary | ICD-10-CM

## 2022-03-09 ENCOUNTER — Other Ambulatory Visit: Payer: Self-pay | Admitting: Internal Medicine

## 2022-03-09 DIAGNOSIS — E039 Hypothyroidism, unspecified: Secondary | ICD-10-CM

## 2022-03-24 ENCOUNTER — Ambulatory Visit (INDEPENDENT_AMBULATORY_CARE_PROVIDER_SITE_OTHER): Payer: Medicare Other | Admitting: *Deleted

## 2022-03-24 DIAGNOSIS — N184 Chronic kidney disease, stage 4 (severe): Secondary | ICD-10-CM

## 2022-03-24 DIAGNOSIS — I10 Essential (primary) hypertension: Secondary | ICD-10-CM

## 2022-03-24 NOTE — Chronic Care Management (AMB) (Signed)
Chronic Care Management   CCM RN Visit Note  03/24/2022 Name: Jackie Horton MRN: [id number] DOB: 27-Jan-1933  Subjective: Jackie Horton is a 88 y.o. year old female who is a primary care patient of Janith Lima, MD. The care management team was consulted for assistance with disease management and care coordination needs.    Engaged with patient by telephone for follow up visit in response to provider referral for case management and/or care coordination services.   Consent to Services:  The patient was given information about Chronic Care Management services, agreed to services, and gave verbal consent prior to initiation of services.  Please see initial visit note for detailed documentation.  Patient agreed to services and verbal consent obtained.   Assessment: Review of patient past medical history, allergies, medications, health status, including review of consultants reports, laboratory and other test data, was performed as part of comprehensive evaluation and provision of chronic care management services.   SDOH (Social Determinants of Health) assessments and interventions performed:  SDOH Interventions    Flowsheet Row Most Recent Value  SDOH Interventions   Food Insecurity Interventions Intervention Not Indicated  [continues to deny food insecurity]  Housing Interventions Intervention Not Indicated  [reports continues to reside alone,  son lives nearby checks in with patient daily]  Transportation Interventions Intervention Not Indicated  [reports son continues to provide transportation]     CCM Care Plan  No Known Allergies  Outpatient Encounter Medications as of 03/24/2022  Medication Sig   amLODipine (NORVASC) 10 MG tablet TAKE ONE TABLET DAILY   Ascorbic Acid (VITAMIN C PO) Take 1 tablet by mouth daily.   Cholecalciferol (VITAMIN D PO) Take 1 tablet by mouth daily.   levothyroxine (SYNTHROID) 50 MCG tablet Take 1 tablet (50 mcg total) by mouth every other day.    levothyroxine (SYNTHROID) 75 MCG tablet TAKE ONE TABLET EVERY OTHER DAY   losartan (COZAAR) 50 MG tablet TAKE ONE TABLET EACH DAY   potassium chloride (KLOR-CON) 10 MEQ tablet TAKE ONE TABLET EACH DAY   rosuvastatin (CRESTOR) 10 MG tablet TAKE ONE TABLET DAILY   No facility-administered encounter medications on file as of 03/24/2022.   Patient Active Problem List   Diagnosis Date Noted   Chronic renal disease, stage 4, severely decreased glomerular filtration rate (GFR) between 15-29 mL/min/1.73 square meter ([place]) 12/19/2021   Flu vaccine need 11/19/2020   Stage 3b chronic kidney disease ([place]) 11/19/2020   Primary osteoarthritis of both shoulders 09/12/2019   Sensorineural hearing loss (SNHL) of both ears 12/13/2018   Cerebellar ataxia in diseases classified elsewhere ([place]) 12/13/2018   Depression with somatization 07/22/2016   Hypercalcemia 06/11/2015   Type II diabetes mellitus with manifestations ([place]) 02/21/2014   Routine general medical examination at a health care facility 02/21/2014   COPD with chronic bronchitis ([place]) 11/17/2013   DJD (degenerative joint disease) 05/15/2011   Osteopenia 05/15/2011   Constipation 02/01/2011   Hypertension 01/28/2011   Hypercholesteremia 01/28/2011   Breast cancer ([place]) 01/28/2011   Hypothyroid 01/28/2011   Cigarette smoker 01/28/2011   Conditions to be addressed/monitored:  HTN and CKD Stage IV  Care Plan : RN Care Manager Plan of Care  Updates made by Knox Royalty, RN since 03/24/2022 12:00 AM     Problem: Chronic disease management needs   Priority: High     Long-Range Goal: Ongoing adherence to estab;lished plan of care for long term chronic disease management   Start Date: 12/25/2021  Expected End Date:  12/26/2022  Priority: High  Note:   Current Barriers:  Chronic Disease Management support and education needs related to HTN and CKD Stage IV  RNCM Clinical Goal(s):  Patient will demonstrate ongoing health management  independence as evidenced by adherence to plan of care for HTN/ CKD        through collaboration with RN Care manager, provider, and care team.   Interventions: 1:1 collaboration with primary care provider regarding development and update of comprehensive plan of care as evidenced by provider attestation and co-signature Inter-disciplinary care team collaboration (see longitudinal plan of care) Evaluation of current treatment plan related to  self management and patient's adherence to plan as established by provider CCM RN CM Initial assessment completed 12/25/21 Review of patient status, including review of consultants reports, relevant laboratory and other test results, and medications completed SDOH updated: no new/ unmet concerns identified Pain assessment updated: denies pain Falls assessment updated: continues to deny new/ recent falls x 12 months- does not require assistive devices;  positive reinforcement provided with encouragement to continue efforts at fall prevention; previously provided education around fall risks/ prevention reinforced Medications discussed: reports son Jackie Horton continues to manage medications and she denies current concerns/ issues/ questions around medications; endorses adherence to taking all medications as prescribed Reviewed upcoming scheduled provider appointments: 06/15/22: PCP; patient confirms is aware of all and has plans to attend as scheduled Discussed plans with patient for ongoing care management follow up and provided patient with direct contact information for care management team     Chronic Kidney Disease (Status: 03/24/22: Goal on Track (progressing): YES.)  Long Term Goal ; Hypertension: (Status: 03/24/22: Goal on track/ progressing) Long Term Goal   Last practice recorded BP readings:  BP Readings from Last 3 Encounters:  12/18/21 124/76  06/10/21 132/78  11/19/20 132/78  Most recent eGFR/CrCl: No results found for: EGFR  No components found for:  CRCL  Assessed the patient     understanding of chronic kidney disease    Evaluation of current treatment plan related to chronic kidney disease self management and patient's adherence to plan as established by provider      Reviewed prescribed diet heart healthy, low salt, low cholesterol Patient continues to deny issues with kidney function today; states, "nothing is changed since I talked to you last and I am doing great and not having any problems" Confirmed patient remains independent in toileting: reports "I make plenty of urine"  Evaluation of current treatment plan related to hypertension self management and patient's adherence to plan as established by provider;   Provided education to patient re: stroke prevention, s/s of heart attack and stroke; Discussed complications of poorly controlled blood pressure such as heart disease, stroke, circulatory complications, vision complications, kidney impairment, sexual dysfunction;  Confirmed patient not monitoring blood pressures or weights at home; again states "no need to," "I feel great and am doing great"  Confirmed patient remains independent and "tries to eat healthy"  Patient Goals/Self-Care Activities: As evidenced by review of EHR, collaboration with care team, and patient reporting during CCM RN CM outreach,   Patient Jackie Horton will: Take medications as prescribed Attend all scheduled provider appointments Call pharmacy for medication refills Call provider office for new concerns or questions Continue to follow heart healthy, low salt, low cholesterol diet Keep up the great work preventing falls    Plan: Telephone follow up appointment with care management team member scheduled for: Wednesday, June 17, 2022 at 9:45 am The patient has been  provided with contact information for the care management team and has been advised to call with any health related questions or concerns  Oneta Rack, RN, BSN, [place] [phone]: direct office

## 2022-03-24 NOTE — Patient Instructions (Signed)
Visit [place], thank you for taking time to talk with me today. Please don't hesitate to contact me if I can be of assistance to you before our next scheduled telephone appointment  Below are the goals we discussed today:  Patient Self-Care Activities: Patient Jackie Horton will: Take medications as prescribed Attend all scheduled provider appointments Call pharmacy for medication refills Call provider office for new concerns or questions Continue to follow heart healthy, low salt, low cholesterol diet Keep up the great work preventing falls  Our next scheduled telephone follow up visit/ appointment is scheduled on:   Wednesday, June 17, 2022 at 9:45 am- This is a PHONE [place] appointment  If you need to cancel or re-schedule our visit, please call [phone] and our care guide team will be happy to assist you.   I look forward to hearing about your progress.   Oneta Rack, RN, BSN, [place] [phone]: direct office  If you are experiencing a Mental Health or [place] or need someone to talk to, please  call the Suicide and Crisis Lifeline: 988 call the [place] National Suicide Prevention Lifeline: 828-153-5293 or TTY: (802) 655-5216 TTY 731-618-9303) to talk to a trained counselor call 1-800-273-TALK (toll free, 24 hour hotline) go to [facility] [address] [place] [phone]) call 911   The patient verbalized understanding of instructions, educational materials, and care plan provided today and agreed to receive a mailed copy of patient instructions, educational materials, and care plan  Heart-Healthy Eating Plan Many factors influence your heart (coronary) health, including eating and exercise habits. Coronary risk increases with abnormal blood fat (lipid) levels. Heart-healthy meal planning includes limiting unhealthy fats, increasing  healthy fats, and making other diet and lifestyle changes. What is my plan? Your health care provider may recommend that you: Limit your fat intake to _________% or less of your total calories each day. Limit your saturated fat intake to _________% or less of your total calories each day. Limit the amount of cholesterol in your diet to less than _________ mg per day. What are tips for following this plan? Cooking Cook foods using methods other than frying. Baking, boiling, grilling, and broiling are all good options. Other ways to reduce fat include: Removing the skin from poultry. Removing all visible fats from meats. Steaming vegetables in water or broth. Meal planning  At meals, imagine dividing your plate into fourths: Fill one-half of your plate with vegetables and green salads. Fill one-fourth of your plate with whole grains. Fill one-fourth of your plate with lean protein foods. Eat 4-5 servings of vegetables per day. One serving equals 1 cup raw or cooked vegetable, or 2 cups raw leafy greens. Eat 4-5 servings of fruit per day. One serving equals 1 medium whole fruit,  cup dried fruit,  cup fresh, frozen, or canned fruit, or  cup 100% fruit juice. Eat more foods that contain soluble fiber. Examples include apples, broccoli, carrots, beans, peas, and barley. Aim to get 25-30 g of fiber per day. Increase your consumption of legumes, nuts, and seeds to 4-5 servings per week. One serving of dried beans or legumes equals  cup cooked, 1 serving of nuts is  cup, and 1 serving of seeds equals 1 tablespoon. Fats Choose healthy fats more often. Choose monounsaturated and polyunsaturated fats, such as olive and canola oils, flaxseeds, walnuts, almonds, and seeds. Eat more omega-3 fats. Choose salmon, mackerel, sardines, tuna,  flaxseed oil, and ground flaxseeds. Aim to eat fish at least 2 times each week. Check food labels carefully to identify foods with trans fats or high amounts of  saturated fat. Limit saturated fats. These are found in animal products, such as meats, butter, and cream. Plant sources of saturated fats include palm oil, palm kernel oil, and coconut oil. Avoid foods with partially hydrogenated oils in them. These contain trans fats. Examples are stick margarine, some tub margarines, cookies, crackers, and other baked goods. Avoid fried foods. General information Eat more home-cooked food and less restaurant, buffet, and fast food. Limit or avoid alcohol. Limit foods that are high in starch and sugar. Lose weight if you are overweight. Losing just 5-10% of your body weight can help your overall health and prevent diseases such as diabetes and heart disease. Monitor your salt (sodium) intake, especially if you have high blood pressure. Talk with your health care provider about your sodium intake. Try to incorporate more vegetarian meals weekly. What foods can I eat? Fruits All fresh, canned (in natural juice), or frozen fruits. Vegetables Fresh or frozen vegetables (raw, steamed, roasted, or grilled). Green salads. Grains Most grains. Choose whole wheat and whole grains most of the time. Rice and pasta, including brown rice and pastas made with whole wheat. Meats and other proteins Lean, well-trimmed beef, veal, pork, and lamb. Chicken and [place] without skin. All fish and shellfish. Wild duck, rabbit, pheasant, and venison. Egg whites or low-cholesterol egg substitutes. Dried beans, peas, lentils, and tofu. Seeds and most nuts. Dairy Low-fat or nonfat cheeses, including ricotta and mozzarella. Skim or 1% milk (liquid, powdered, or evaporated). Buttermilk made with low-fat milk. Nonfat or low-fat yogurt. Fats and oils Non-hydrogenated (trans-free) margarines. Vegetable oils, including soybean, sesame, sunflower, olive, peanut, safflower, corn, canola, and cottonseed. Salad dressings or mayonnaise made with a vegetable oil. Beverages Water (mineral or  sparkling). Coffee and tea. Diet carbonated beverages. Sweets and desserts Sherbet, gelatin, and fruit ice. Small amounts of dark chocolate. Limit all sweets and desserts. Seasonings and condiments All seasonings and condiments. The items listed above may not be a complete list of foods and beverages you can eat. Contact a dietitian for more options. What foods are not recommended? Fruits Canned fruit in heavy syrup. Fruit in cream or butter sauce. Fried fruit. Limit coconut. Vegetables Vegetables cooked in cheese, cream, or butter sauce. Fried vegetables. Grains Breads made with saturated or trans fats, oils, or whole milk. Croissants. Sweet rolls. Donuts. High-fat crackers, such as cheese crackers. Meats and other proteins Fatty meats, such as hot dogs, ribs, sausage, bacon, rib-eye roast or steak. High-fat deli meats, such as salami and bologna. Caviar. Domestic duck and goose. Organ meats, such as liver. Dairy Cream, sour cream, cream cheese, and creamed cottage cheese. Whole-milk cheeses. Whole or 2% milk (liquid, evaporated, or condensed). Whole buttermilk. Cream sauce or high-fat cheese sauce. Whole-milk yogurt. Fats and oils Meat fat, or shortening. Cocoa butter, hydrogenated oils, palm oil, coconut oil, palm kernel oil. Solid fats and shortenings, including bacon fat, salt pork, lard, and butter. Nondairy cream substitutes. Salad dressings with cheese or sour cream. Beverages Regular sodas and any drinks with added sugar. Sweets and desserts Frosting. Pudding. Cookies. Cakes. Pies. Milk chocolate or white chocolate. Buttered syrups. Full-fat ice cream or ice cream drinks. The items listed above may not be a complete list of foods and beverages to avoid. Contact a dietitian for more information. Summary Heart-healthy meal planning includes limiting unhealthy fats, increasing  healthy fats, and making other diet and lifestyle changes. Lose weight if you are overweight. Losing just  5-10% of your body weight can help your overall health and prevent diseases such as diabetes and heart disease. Focus on eating a balance of foods, including fruits and vegetables, low-fat or nonfat dairy, lean protein, nuts and legumes, whole grains, and heart-healthy oils and fats. This information is not intended to replace advice given to you by your health care provider. Make sure you discuss any questions you have with your health care provider. Document Revised: 02/20/2021 Document Reviewed: 02/20/2021 Elsevier Patient Education  2022 [employer].

## 2022-03-25 ENCOUNTER — Ambulatory Visit: Payer: Self-pay | Admitting: Licensed Clinical Social Worker

## 2022-03-25 DIAGNOSIS — I129 Hypertensive chronic kidney disease with stage 1 through stage 4 chronic kidney disease, or unspecified chronic kidney disease: Secondary | ICD-10-CM

## 2022-03-25 DIAGNOSIS — N184 Chronic kidney disease, stage 4 (severe): Secondary | ICD-10-CM

## 2022-03-25 DIAGNOSIS — I10 Essential (primary) hypertension: Secondary | ICD-10-CM

## 2022-03-25 NOTE — Patient Instructions (Signed)
    Patient was not contacted during this encounter.  LCSW collaborated with care team to accomplish patient's care plan goal   Casimer Lanius, LCSW Licensed Clinical Social Worker [employer] [place]  [phone]

## 2022-03-25 NOTE — Chronic Care Management (AMB) (Signed)
  Care Management  Consultation Note  03/25/2022 Name: Jackie Horton MRN: [id number] DOB: [birth year]  Jackie Horton is a 88 y.o. year old female who is a primary care patient of Janith Lima, MD.   Assessment: Patient was not interviewed or contacted during this encounter.    Intervention:Conducted brief assessment, recommendations and relevant information discussed.  CCM LCSW collaborated with [place] after her phone appointment with patient yesterday .  No needs identified for CCM LCSW at this time.  CCM RN will continue to work with patient and notify CCM LCSW if any needs are identified.  Follow up Plan: CCM LCSW will disconnect from patient's care team at this time, but will be available at any time they would like to re-engage for care coordination services.    Review of patient past medical history, allergies, medications, and health status, including review of pertinent consultant reports was performed as part of comprehensive evaluation and provision of care management/care coordination services.     Casimer Lanius, LCSW Licensed Clinical Social Worker [facility]  [place]  [phone]

## 2022-04-13 ENCOUNTER — Other Ambulatory Visit: Payer: Self-pay | Admitting: Internal Medicine

## 2022-06-11 ENCOUNTER — Ambulatory Visit: Payer: Medicare Other | Admitting: *Deleted

## 2022-06-11 DIAGNOSIS — N184 Chronic kidney disease, stage 4 (severe): Secondary | ICD-10-CM

## 2022-06-11 DIAGNOSIS — I10 Essential (primary) hypertension: Secondary | ICD-10-CM

## 2022-06-11 NOTE — Chronic Care Management (AMB) (Signed)
Care Management    RN Visit Note  06/11/2022 Name: Jackie Horton MRN: [id number] DOB: [birth year]  Subjective: Jackie Horton is a 88 y.o. year old female who is a primary care patient of Jackie Grandchild, MD. The care management team was consulted for assistance with disease management and care coordination needs.    Engaged with patient by telephone for follow up visit/ RN CM case closure in response to provider referral for case management and/or care coordination services.   Consent to Services:   Jackie Horton was given information about Care Management services 12/23/21 including:  Care Management services includes personalized support from designated clinical staff supervised by her physician, including individualized plan of care and coordination with other care providers 24/7 contact phone numbers for assistance for urgent and routine care needs. The patient may stop case management services at any time by phone call to the office staff.  Patient agreed to services and consent obtained.   Assessment: Review of patient past medical history, allergies, medications, health status, including review of consultants reports, laboratory and other test data, was performed as part of comprehensive evaluation and provision of chronic care management services.   SDOH (Social Determinants of Health) assessments and interventions performed:  SDOH Interventions    Flowsheet Row Most Recent Value  SDOH Interventions   Food Insecurity Interventions Intervention Not Indicated  Transportation Interventions Intervention Not Indicated  [son continues to provide transportation]     Care Plan  No Known Allergies  Outpatient Encounter Medications as of 06/11/2022  Medication Sig   amLODipine (NORVASC) 10 MG tablet TAKE ONE TABLET DAILY   Ascorbic Acid (VITAMIN C PO) Take 1 tablet by mouth daily.   Cholecalciferol (VITAMIN D PO) Take 1 tablet by mouth daily.   levothyroxine (SYNTHROID) 50 MCG  tablet Take 1 tablet (50 mcg total) by mouth every other day.   levothyroxine (SYNTHROID) 75 MCG tablet TAKE ONE TABLET EVERY OTHER DAY   losartan (COZAAR) 50 MG tablet TAKE ONE TABLET EACH DAY   meloxicam (MOBIC) 7.5 MG tablet TAKE ONE TABLET DAILY   potassium chloride (KLOR-CON) 10 MEQ tablet TAKE ONE TABLET EACH DAY   rosuvastatin (CRESTOR) 10 MG tablet TAKE ONE TABLET DAILY   No facility-administered encounter medications on file as of 06/11/2022.   Patient Active Problem List   Diagnosis Date Noted   Chronic renal disease, stage 4, severely decreased glomerular filtration rate (GFR) between 15-29 mL/min/1.73 square meter (HCC) 12/19/2021   Flu vaccine need 11/19/2020   Stage 3b chronic kidney disease (HCC) 11/19/2020   Primary osteoarthritis of both shoulders 09/12/2019   Sensorineural hearing loss (SNHL) of both ears 12/13/2018   Cerebellar ataxia in diseases classified elsewhere (HCC) 12/13/2018   Depression with somatization 07/22/2016   Hypercalcemia 06/11/2015   Type II diabetes mellitus with manifestations (HCC) 02/21/2014   Routine general medical examination at a health care facility 02/21/2014   COPD with chronic bronchitis (HCC) 11/17/2013   DJD (degenerative joint disease) 05/15/2011   Osteopenia 05/15/2011   Constipation 02/01/2011   Hypertension 01/28/2011   Hypercholesteremia 01/28/2011   Breast cancer (HCC) 01/28/2011   Hypothyroid 01/28/2011   Cigarette smoker 01/28/2011   Conditions to be addressed/monitored: HTN and CKD Stage IV  Care Plan : RN Care Manager Plan of Care  Updates made by Michaela Corner, RN since 06/11/2022 12:00 AM     Problem: Chronic disease management needs   Priority: High     Long-Range Goal: Ongoing adherence  to estab;lished plan of care for long term chronic disease management   Start Date: 12/25/2021  Expected End Date: 12/26/2022  Priority: High  Note:   Current Barriers:  Chronic Disease Management support and education needs  related to HTN and CKD Stage IV  RNCM Clinical Goal(s):  Patient will demonstrate ongoing health management independence as evidenced by adherence to plan of care for HTN/ CKD        through collaboration with RN Care manager, provider, and care team.   Interventions: 1:1 collaboration with primary care provider regarding development and update of comprehensive plan of care as evidenced by provider attestation and co-signature Inter-disciplinary care team collaboration (see longitudinal plan of care) Evaluation of current treatment plan related to  self management and patient's adherence to plan as established by provider CCM RN CM Initial assessment completed 12/25/21 Review of patient status, including review of consultants reports, relevant laboratory and other test results, and medications completed SDOH updated: no new/ unmet concerns identified Pain assessment updated: denies pain Falls assessment updated: continues to deny new/ recent falls x 12 months- does not require assistive devices;  positive reinforcement provided with encouragement to continue efforts at fall prevention; previously provided education around fall risks/ prevention reinforced Medications discussed: again reports son Okey Dupre continues to manage medications and she denies current concerns/ issues/ questions around medications; endorses adherence to taking all medications as prescribed Reviewed upcoming scheduled provider appointments: 06/16/22: PCP; patient confirms is aware of and has plans to attend as scheduled Discussed plans with patient for ongoing care management follow up- patient denies current care coordination/ care management needs and is agreeable to RN CM case closure today; verbalizes understanding to contact PCP or other care providers for any needs that arise in the future, and confirms she has contact information for all care providers     Chronic Kidney Disease (Status: 06/11/22: Goal Met.)  Long Term Goal  ; Hypertension: (Status: 06/11/22: Goal met) Long Term Goal   Last practice recorded BP readings:  BP Readings from Last 3 Encounters:  12/18/21 124/76  06/10/21 132/78  11/19/20 132/78  Most recent eGFR/CrCl: No results found for: EGFR  No components found for: CRCL  Assessed the patient     understanding of chronic kidney disease    Evaluation of current treatment plan related to chronic kidney disease self management and patient's adherence to plan as established by provider      Reviewed prescribed diet heart healthy, low salt, low cholesterol  patient reports adherence to prescribed/ recommended diet Patient continues to deny issues with kidney function today; states, "I am doing great and not having any problems and I can't wait to see Dr. Ronnald Ramp next week so he can see how great I am doing" Confirmed patient remains independent in toileting: again reports "I make plenty of urine"  Evaluation of current treatment plan related to hypertension self management and patient's adherence to plan as established by provider;   Provided education to patient re: stroke prevention, s/s of heart attack and stroke; Discussed complications of poorly controlled blood pressure such as heart disease, stroke, circulatory complications, vision complications, kidney impairment, sexual dysfunction;  Confirmed patient has started periodically monitoring blood pressures at home; states "I've been checking it about every other day and they are always in normal limits;" she is unable to provide specific values today- reports she does not record blood pressures from home monitoring on paper; we reviewed general guidelines for standard target blood pressure values  Confirmed  patient remains independent and "tries to eat healthy"     Plan:  No further follow up required: patient denies current care coordination/ care management needs and is agreeable to RN CM case closure today; case closure accordingly     Oneta Rack, RN, BSN, [place] [phone]: direct office

## 2022-06-15 ENCOUNTER — Ambulatory Visit: Payer: Medicare Other | Admitting: Internal Medicine

## 2022-06-16 ENCOUNTER — Other Ambulatory Visit: Payer: Self-pay | Admitting: Internal Medicine

## 2022-06-16 ENCOUNTER — Ambulatory Visit (INDEPENDENT_AMBULATORY_CARE_PROVIDER_SITE_OTHER): Payer: Medicare Other | Admitting: Internal Medicine

## 2022-06-16 ENCOUNTER — Encounter: Payer: Self-pay | Admitting: Internal Medicine

## 2022-06-16 ENCOUNTER — Other Ambulatory Visit: Payer: Self-pay

## 2022-06-16 ENCOUNTER — Telehealth: Payer: Self-pay | Admitting: Internal Medicine

## 2022-06-16 VITALS — BP 130/78 | HR 86 | Temp 98.1°F | Resp 18 | Ht 62.0 in | Wt 114.8 lb

## 2022-06-16 DIAGNOSIS — Z0000 Encounter for general adult medical examination without abnormal findings: Secondary | ICD-10-CM | POA: Diagnosis not present

## 2022-06-16 DIAGNOSIS — E039 Hypothyroidism, unspecified: Secondary | ICD-10-CM | POA: Diagnosis not present

## 2022-06-16 DIAGNOSIS — N184 Chronic kidney disease, stage 4 (severe): Secondary | ICD-10-CM | POA: Diagnosis not present

## 2022-06-16 DIAGNOSIS — E7800 Pure hypercholesterolemia, unspecified: Secondary | ICD-10-CM

## 2022-06-16 DIAGNOSIS — I10 Essential (primary) hypertension: Secondary | ICD-10-CM | POA: Diagnosis not present

## 2022-06-16 DIAGNOSIS — E118 Type 2 diabetes mellitus with unspecified complications: Secondary | ICD-10-CM | POA: Diagnosis not present

## 2022-06-16 DIAGNOSIS — J449 Chronic obstructive pulmonary disease, unspecified: Secondary | ICD-10-CM

## 2022-06-16 DIAGNOSIS — J4489 Other specified chronic obstructive pulmonary disease: Secondary | ICD-10-CM

## 2022-06-16 LAB — CBC WITH DIFFERENTIAL/PLATELET
Basophils Absolute: 0.1 10*3/uL (ref 0.0–0.1)
Basophils Relative: 0.6 % (ref 0.0–3.0)
Eosinophils Absolute: 0.1 10*3/uL (ref 0.0–0.7)
Eosinophils Relative: 0.6 % (ref 0.0–5.0)
HCT: 45.2 % (ref 36.0–46.0)
Hemoglobin: 15 g/dL (ref 12.0–15.0)
Lymphocytes Relative: 36.3 % (ref 12.0–46.0)
Lymphs Abs: 3.4 10*3/uL (ref 0.7–4.0)
MCHC: 33.3 g/dL (ref 30.0–36.0)
MCV: 89.9 fl (ref 78.0–100.0)
Monocytes Absolute: 0.6 10*3/uL (ref 0.1–1.0)
Monocytes Relative: 6.8 % (ref 3.0–12.0)
Neutro Abs: 5.3 10*3/uL (ref 1.4–7.7)
Neutrophils Relative %: 55.7 % (ref 43.0–77.0)
Platelets: 257 10*3/uL (ref 150.0–400.0)
RBC: 5.02 Mil/uL (ref 3.87–5.11)
RDW: 14.5 % (ref 11.5–15.5)
WBC: 9.4 10*3/uL (ref 4.0–10.5)

## 2022-06-16 LAB — BASIC METABOLIC PANEL
BUN: 16 mg/dL (ref 6–23)
CO2: 23 mEq/L (ref 19–32)
Calcium: 10.6 mg/dL — ABNORMAL HIGH (ref 8.4–10.5)
Chloride: 104 mEq/L (ref 96–112)
Creatinine, Ser: 0.99 mg/dL (ref 0.40–1.20)
GFR: 51.47 mL/min — ABNORMAL LOW (ref >60.00)
Glucose, Bld: 123 mg/dL — ABNORMAL HIGH (ref 70–99)
Potassium: 3.9 mEq/L (ref 3.5–5.1)
Sodium: 140 mEq/L (ref 135–145)

## 2022-06-16 LAB — LIPID PANEL
Cholesterol: 117 mg/dL (ref 0–200)
HDL: 38.2 mg/dL — ABNORMAL LOW (ref >39.00)
LDL Cholesterol: 60 mg/dL (ref 0–99)
NonHDL: 78.94
Total CHOL/HDL Ratio: 3
Triglycerides: 97 mg/dL (ref 0.0–149.0)
VLDL: 19.4 mg/dL (ref 0.0–40.0)

## 2022-06-16 LAB — URINALYSIS, ROUTINE W REFLEX MICROSCOPIC
Hgb urine dipstick: NEGATIVE
Nitrite: NEGATIVE
RBC / HPF: NONE SEEN (ref 0–?)
Specific Gravity, Urine: 1.01 (ref 1.000–1.030)
Urine Glucose: NEGATIVE
Urobilinogen, UA: 1 (ref 0.0–1.0)
pH: 6.5 (ref 5.0–8.0)

## 2022-06-16 LAB — HEPATIC FUNCTION PANEL
ALT: 22 U/L (ref 0–35)
AST: 23 U/L (ref 0–37)
Albumin: 4.7 g/dL (ref 3.5–5.2)
Alkaline Phosphatase: 85 U/L (ref 39–117)
Bilirubin, Direct: 0.2 mg/dL (ref 0.0–0.3)
Total Bilirubin: 0.7 mg/dL (ref 0.2–1.2)
Total Protein: 7.8 g/dL (ref 6.0–8.3)

## 2022-06-16 LAB — PHOSPHORUS: Phosphorus: 2.5 mg/dL (ref 2.3–4.6)

## 2022-06-16 LAB — TSH: TSH: 0.03 u[IU]/mL — ABNORMAL LOW (ref 0.35–5.50)

## 2022-06-16 LAB — HEMOGLOBIN A1C: Hgb A1c MFr Bld: 6.3 % (ref 4.6–6.5)

## 2022-06-16 NOTE — Patient Instructions (Signed)

## 2022-06-16 NOTE — Telephone Encounter (Signed)
LVM for pt to rtn my call to schedule AWV with [facility] call back # [phone] 

## 2022-06-16 NOTE — Progress Notes (Signed)
Subjective:  Patient ID: Jackie Horton, female    DOB: May 26, 1933  Age: 88 y.o. MRN: [id number]  CC: Annual Exam, Hypertension, Diabetes, and Hyperlipidemia   HPI  Jackie Horton presents for a CPX and f/up-    Outpatient Medications Prior to Visit  Medication Sig Dispense Refill   amLODipine (NORVASC) 10 MG tablet TAKE ONE TABLET DAILY 90 tablet 1   Ascorbic Acid (VITAMIN C PO) Take 1 tablet by mouth daily.     Cholecalciferol (VITAMIN D PO) Take 1 tablet by mouth daily.     losartan (COZAAR) 50 MG tablet TAKE ONE TABLET EACH DAY 90 tablet 1   meloxicam (MOBIC) 7.5 MG tablet TAKE ONE TABLET DAILY 90 tablet 1   rosuvastatin (CRESTOR) 10 MG tablet TAKE ONE TABLET DAILY 90 tablet 1   levothyroxine (SYNTHROID) 50 MCG tablet Take 1 tablet (50 mcg total) by mouth every other day. 45 tablet 0   levothyroxine (SYNTHROID) 75 MCG tablet TAKE ONE TABLET EVERY OTHER DAY 45 tablet 0   potassium chloride (KLOR-CON) 10 MEQ tablet TAKE ONE TABLET EACH DAY 90 tablet 1   No facility-administered medications prior to visit.    ROS Review of Systems  Constitutional:  Positive for unexpected weight change. Negative for chills, diaphoresis and fatigue.  HENT: Negative.    Respiratory:  Positive for cough and shortness of breath. Negative for chest tightness and wheezing.   Cardiovascular:  Negative for chest pain, palpitations and leg swelling.  Gastrointestinal:  Negative for abdominal pain, constipation and diarrhea.  Endocrine: Negative.   Genitourinary: Negative.   Musculoskeletal: Negative.   Skin: Negative.   Neurological:  Negative for dizziness and weakness.  Hematological:  Negative for adenopathy. Does not bruise/bleed easily.  Psychiatric/Behavioral: Negative.      Objective:  BP 130/78 (BP Location: Right Arm, Patient Position: Sitting)   Pulse 86   Temp 98.1 F (36.7 C) (Temporal)   Resp 18   Ht '5\' 2"'$  (1.575 m)   Wt 114 lb 12.8 oz (52.1 kg)   SpO2 98%   BMI 21.00  kg/m   BP Readings from Last 3 Encounters:  06/16/22 130/78  12/18/21 124/76  06/10/21 132/78    Wt Readings from Last 3 Encounters:  06/16/22 114 lb 12.8 oz (52.1 kg)  12/18/21 125 lb (56.7 kg)  06/10/21 127 lb (57.6 kg)    Physical Exam Vitals reviewed.  Constitutional:      General: She is not in acute distress.    Appearance: She is underweight. She is ill-appearing. She is not toxic-appearing or diaphoretic.  HENT:     Mouth/Throat:     Mouth: Mucous membranes are moist.  Eyes:     General: No scleral icterus.    Conjunctiva/sclera: Conjunctivae normal.  Cardiovascular:     Rate and Rhythm: Normal rate and regular rhythm.     Heart sounds: No murmur heard. Pulmonary:     Effort: Pulmonary effort is normal.     Breath sounds: No stridor. No wheezing, rhonchi or rales.  Abdominal:     General: Abdomen is flat.     Palpations: There is no mass.     Tenderness: There is no abdominal tenderness. There is no guarding.     Hernia: No hernia is present.  Musculoskeletal:        General: Normal range of motion.     Cervical back: Neck supple.     Right lower leg: No edema.     Left lower  leg: No edema.  Lymphadenopathy:     Cervical: No cervical adenopathy.  Skin:    General: Skin is dry.  Neurological:     General: No focal deficit present.     Mental Status: She is alert.  Psychiatric:        Mood and Affect: Mood normal.        Behavior: Behavior normal.     Lab Results  Component Value Date   WBC 9.4 06/16/2022   HGB 15.0 06/16/2022   HCT 45.2 06/16/2022   PLT 257.0 06/16/2022   GLUCOSE 123 (H) 06/16/2022   CHOL 117 06/16/2022   TRIG 97.0 06/16/2022   HDL 38.20 (L) 06/16/2022   LDLCALC 60 06/16/2022   ALT 22 06/16/2022   AST 23 06/16/2022   NA 140 06/16/2022   K 3.9 06/16/2022   CL 104 06/16/2022   CREATININE 0.99 06/16/2022   BUN 16 06/16/2022   CO2 23 06/16/2022   TSH 0.03 Repeated and verified X2. (L) 06/16/2022   INR 1.0 05/16/2009    HGBA1C 6.3 06/16/2022   MICROALBUR 4.7 (H) 06/16/2022    MR Brain Wo Contrast  Result Date: 12/20/2018 CLINICAL DATA:  Falling over the last week.  Confusion and weakness. EXAM: MRI HEAD WITHOUT CONTRAST TECHNIQUE: Multiplanar, multiecho pulse sequences of the brain and surrounding structures were obtained without intravenous contrast. COMPARISON:  07/11/2013.  01/13/2012. FINDINGS: Brain: Diffusion imaging does not show any acute or subacute infarction. There chronic small-vessel ischemic changes of the pons. No focal cerebellar insult. Cerebral hemispheres show old small vessel infarctions in the left lateral thalamus and left basal ganglia. There is an old right posterior frontal cortical and subcortical infarction. There are mild chronic small-vessel ischemic changes of the deep white matter. There is some hemosiderin deposition in the region of the old infarctions in the left thalamus and basal ganglia. There could be a developmental venous anomaly in that region. No sign of mass, acute hemorrhage, hydrocephalus or extra-axial collection. Vascular: Major vessels at the base of the brain show flow. Skull and upper cervical spine: Chronic arthropathy at the C1-2 articulation but without encroachment upon the spinal canal. Sinuses/Orbits: Clear/normal Other: None IMPRESSION: No acute or reversible finding. Chronic small-vessel ischemic changes of the pons and cerebral hemispheric white matter. 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 I wonder if there is a developmental venous anomaly in this region as well. If so, this is obviously lifelong and would not be specifically treatable in any way. Electronically Signed   By: Nelson Chimes M.D.   On: 12/20/2018 08:01    Assessment & Plan:   Jackie Horton was seen today for annual exam, hypertension, diabetes and hyperlipidemia.  Diagnoses and all orders for this visit:  Acquired hypothyroidism- Her  TSH is suppressed.  Will lower her T4 dosage. -     TSH; Future -     TSH -     levothyroxine (SYNTHROID) 50 MCG tablet; Take 1 tablet (50 mcg total) by mouth every other day.  Primary hypertension- Her blood pressure is adequately well controlled. -     Hepatic function panel; Future -     Urinalysis, Routine w reflex microscopic; Future -     CBC with Differential/Platelet; Future -     CBC with Differential/Platelet -     Urinalysis, Routine w reflex microscopic -     Hepatic function panel -     potassium chloride (KLOR-CON) 10 MEQ  tablet; TAKE ONE TABLET EACH DAY  COPD with chronic bronchitis (HCC)  Chronic renal disease, stage 4, severely decreased glomerular filtration rate (GFR) between 15-29 mL/min/1.73 square meter (Jackie Horton)- Her renal function is stable. -     Urinalysis, Routine w reflex microscopic; Future -     Microalbumin / creatinine urine ratio; Future -     Phosphorus; Future -     Phosphorus -     Microalbumin / creatinine urine ratio -     Urinalysis, Routine w reflex microscopic  Hypercholesteremia- LDL goal achieved. Doing well on the statin  -     Lipid panel; Future -     Lipid panel  Routine general medical examination at a health care facility- Exam completed, labs reviewed, vaccines reviewed, no cancer screenings indicated, patient education was given.  Hypercalcemia- Her PTH is normal.  I recommended that she undergo a CT of the chest to screen for lung cancer. -     Basic metabolic panel; Future -     PTH, intact and calcium; Future -     Phosphorus; Future -     Phosphorus -     PTH, intact and calcium -     Basic metabolic panel -     CT Chest Wo Contrast; Future  Type II diabetes mellitus with manifestations (Jackie Horton)- Her blood sugar is well controlled. -     Hemoglobin A1c; Future -     Microalbumin / creatinine urine ratio; Future -     Basic metabolic panel; Future -     Basic metabolic panel -     Microalbumin / creatinine urine ratio -      Hemoglobin A1c   I am having Jackie Horton maintain her Ascorbic Acid (VITAMIN C PO), Cholecalciferol (VITAMIN D PO), amLODipine, losartan, rosuvastatin, meloxicam, levothyroxine, and potassium chloride.  Meds ordered this encounter  Medications   levothyroxine (SYNTHROID) 50 MCG tablet    Sig: Take 1 tablet (50 mcg total) by mouth every other day.    Dispense:  90 tablet    Refill:  0   potassium chloride (KLOR-CON) 10 MEQ tablet    Sig: TAKE ONE TABLET EACH DAY    Dispense:  90 tablet    Refill:  1     Follow-up: Return in about 6 months (around 12/17/2022).  Scarlette Calico, MD

## 2022-06-17 ENCOUNTER — Telehealth: Payer: Medicare Other

## 2022-06-17 LAB — MICROALBUMIN / CREATININE URINE RATIO
Creatinine,U: 227.7 mg/dL
Microalb Creat Ratio: 2.1 mg/g (ref 0.0–30.0)
Microalb, Ur: 4.7 mg/dL — ABNORMAL HIGH (ref 0.0–1.9)

## 2022-06-17 MED ORDER — LEVOTHYROXINE SODIUM 50 MCG PO TABS: 50.0000 ug | ORAL_TABLET | ORAL | 0 refills | Status: DC

## 2022-06-17 MED ORDER — POTASSIUM CHLORIDE ER 10 MEQ PO TBCR: EXTENDED_RELEASE_TABLET | ORAL | 1 refills | Status: DC

## 2022-06-18 ENCOUNTER — Encounter: Payer: Self-pay | Admitting: Internal Medicine

## 2022-06-19 LAB — PTH, INTACT AND CALCIUM: PTH: 46 pg/mL (ref 16–77)

## 2022-06-19 MED ORDER — SHINGRIX 50 MCG/0.5ML IM SUSR: 0.5000 mL | Freq: Once | INTRAMUSCULAR | 1 refills | Status: AC

## 2022-06-22 ENCOUNTER — Telehealth: Payer: Self-pay

## 2022-06-22 NOTE — Telephone Encounter (Signed)
Called pt, LVM stating that CT was ordered and she would receive a phone call to schedule.

## 2022-07-10 ENCOUNTER — Other Ambulatory Visit: Payer: Self-pay | Admitting: Internal Medicine

## 2022-07-16 ENCOUNTER — Other Ambulatory Visit: Payer: Self-pay | Admitting: Internal Medicine

## 2022-07-16 DIAGNOSIS — I10 Essential (primary) hypertension: Secondary | ICD-10-CM

## 2022-07-16 DIAGNOSIS — E118 Type 2 diabetes mellitus with unspecified complications: Secondary | ICD-10-CM

## 2022-07-20 ENCOUNTER — Telehealth: Payer: Self-pay | Admitting: Internal Medicine

## 2022-07-20 NOTE — Telephone Encounter (Signed)
I spoke to patient and she'll ask her  son, Okey Dupre, to call back and schedule Medicare Annual Wellness Visit (AWV).   Please offer to do virtually or by telephone.    Last AWV:06/27/2021  Please schedule at anytime with Nurse Health Advisor.

## 2022-08-01 ENCOUNTER — Other Ambulatory Visit: Payer: Self-pay | Admitting: Internal Medicine

## 2022-08-01 DIAGNOSIS — E039 Hypothyroidism, unspecified: Secondary | ICD-10-CM

## 2022-08-04 ENCOUNTER — Other Ambulatory Visit: Payer: Self-pay | Admitting: Internal Medicine

## 2022-08-04 ENCOUNTER — Telehealth: Payer: Self-pay | Admitting: Internal Medicine

## 2022-08-04 NOTE — Telephone Encounter (Signed)
PT's son visits today in need of a refill on an RX. PT is requesting a refill on the levothyroxine 75 MCG. PT is currently down to 2 of the 75 MCG but still has the 50 MCG.   PT's son also wants to make sure that they are distributing these medications correctly and would like to speak with Dr.Jones or staff on this.  CB: [phone]

## 2022-08-06 ENCOUNTER — Ambulatory Visit (INDEPENDENT_AMBULATORY_CARE_PROVIDER_SITE_OTHER): Payer: Medicare Other

## 2022-08-06 ENCOUNTER — Other Ambulatory Visit: Payer: Self-pay | Admitting: Family Medicine

## 2022-08-06 ENCOUNTER — Encounter: Payer: Self-pay | Admitting: Family Medicine

## 2022-08-06 ENCOUNTER — Ambulatory Visit (INDEPENDENT_AMBULATORY_CARE_PROVIDER_SITE_OTHER): Payer: Medicare Other | Admitting: Family Medicine

## 2022-08-06 VITALS — BP 124/70 | HR 91 | Temp 97.4°F | Ht 62.0 in

## 2022-08-06 DIAGNOSIS — M25611 Stiffness of right shoulder, not elsewhere classified: Secondary | ICD-10-CM

## 2022-08-06 DIAGNOSIS — R0781 Pleurodynia: Secondary | ICD-10-CM

## 2022-08-06 DIAGNOSIS — M25551 Pain in right hip: Secondary | ICD-10-CM

## 2022-08-06 DIAGNOSIS — R262 Difficulty in walking, not elsewhere classified: Secondary | ICD-10-CM

## 2022-08-06 DIAGNOSIS — E039 Hypothyroidism, unspecified: Secondary | ICD-10-CM | POA: Diagnosis not present

## 2022-08-06 DIAGNOSIS — M25511 Pain in right shoulder: Secondary | ICD-10-CM

## 2022-08-06 DIAGNOSIS — M1611 Unilateral primary osteoarthritis, right hip: Secondary | ICD-10-CM | POA: Diagnosis not present

## 2022-08-06 LAB — TSH: TSH: 0.56 u[IU]/mL (ref 0.35–5.50)

## 2022-08-06 NOTE — Patient Instructions (Signed)
Your x-rays show degenerative changes but no fracture or dislocation.  Continue taking meloxicam daily.  You can take Tylenol 1000 mg twice daily if needed.  You can use ice or heat  I have referred you to Ortho care and they will call you to schedule an appointment.  Continue 50 mcg of levothyroxine every other day for now and we will call you to let you know your thyroid result and how to take the medication going forward.

## 2022-08-06 NOTE — Assessment & Plan Note (Signed)
RLE is neurovascularly intact.  She is comfortable sitting but unable to stand without severe pain.  X-ray hip and pelvis ordered.  Results show degenerative changes but no fracture or AVN.  Continue taking meloxicam.  May add Tylenol.  Recommend heat or ice.  urgent referral to orthopedics made.

## 2022-08-06 NOTE — Assessment & Plan Note (Signed)
History of arthroplasty and x-ray shows no fracture and prosthesis intact.  Discussed pain management.  Referral to orthopedics

## 2022-08-06 NOTE — Progress Notes (Signed)
Results given to patient and son in office.

## 2022-08-06 NOTE — Assessment & Plan Note (Signed)
TSH ordered.  Apparently she is taking levothyroxine 50 mcg every other day.  Son would like to know if this is the correct dose for her.  Previously they were taking 75 mcg of levothyroxine every other day as well.  They are out of this dose.

## 2022-08-06 NOTE — Assessment & Plan Note (Signed)
Avascular necrosis ruled out.  Unable to walk due to pain, no significant leg weakness and sensation is intact.  Her son helps her walk at home.  Urgent referral to Ortho placed.

## 2022-08-06 NOTE — Progress Notes (Addendum)
FYI. She has been taking levothyroxine 50 mcg every other day x 1 wk and prior to that was taking the same dose and alternating 75 mcg every other day.

## 2022-08-06 NOTE — Progress Notes (Signed)
Subjective:     Patient ID: Jackie Horton, female    DOB: 10/18/1933, 88 y.o.   MRN: [id number]  Chief Complaint  Patient presents with   Leg Pain    Right leg pain for about a month, constant sharp pain starting at the hip and radiating down the leg.    Arm Pain    Right arm for about a month as well. Painful and not able to fully lift arm    Leg Pain   Arm Pain    Patient is in today for a 4 wk hx of R hip pain that radiates down her upper thigh. Cannot sleep on her right side.  Denies injury or fall.  States she cannot stand without severe pain and needs assistance. Her son has been helping her at home.   Pain is not keeping her awake.   Pain and limited ROM of her right shoulder x 4 wks.   Denies fever, chills, dizziness, chest pain, palpitations, shortness of breath, abdominal pain, N/V/D, urinary symptoms, LE edema.   Son requests thyroid labs so that they can determine which dose of levothyroxine she should be taking.  States they are out of the 75 mcg dose. Taking 50 mcg of levothyroxine every other day for the past week. Prior to that they were taking 75 mcg every other day as well.     Health Maintenance Due  Topic Date Due   DEXA SCAN  Never done   OPHTHALMOLOGY EXAM  10/08/2016   INFLUENZA VACCINE  05/26/2022    Past Medical History:  Diagnosis Date   Breast cancer ([place])    Cigarette smoker    Constipation    Hypercholesteremia    Hypertension    Hypothyroid    Osteoarthritis     Past Surgical History:  Procedure Laterality Date   bladder tact  2011   BREAST LUMPECTOMY  2009   PARTIAL HYSTERECTOMY  1972   ROTATOR CUFF REPAIR      Family History  Problem Relation Age of Onset   Aneurysm Father    Breast cancer Sister        3 sisters   Breast cancer Daughter     Social History   Socioeconomic History   Marital status: Single    Spouse name: Not on file   Number of children: 3   Years of education: 11   Highest education level:  Not on file  Occupational History   Occupation: caregiver    Employer: RETIRED  Tobacco Use   Smoking status: Some Days    Packs/day: 0.25    Types: Cigarettes    Last attempt to quit: 10/14/2015    Years since quitting: 6.8   Smokeless tobacco: Never  Vaping Use   Vaping Use: Never used  Substance and Sexual Activity   Alcohol use: No    Alcohol/week: 0.0 standard drinks of alcohol   Drug use: No   Sexual activity: Not Currently    Birth control/protection: Post-menopausal, Surgical  Other Topics Concern   Not on file  Social History Narrative   2 sisters with breast cancer   Friend--Howard Burch   Social Determinants of Health   Financial Resource Strain: Low Risk  (12/24/2021)   Overall Financial Resource Strain (CARDIA)    Difficulty of Paying Living Expenses: Not hard at all  Food Insecurity: No Food Insecurity (06/11/2022)   Hunger Vital Sign    Worried About Running Out of Food in the Last Year: Never  true    Ran Out of Food in the Last Year: Never true  Transportation Needs: No Transportation Needs (06/11/2022)   PRAPARE - Hydrologist (Medical): No    Lack of Transportation (Non-Medical): No  Physical Activity: Insufficiently Active (06/27/2021)   Exercise Vital Sign    Days of Exercise per Week: 1 day    Minutes of Exercise per Session: 20 min  Stress: No Stress Concern Present (12/24/2021)   [place]    Feeling of Stress : Not at all  Social Connections: Moderately Isolated (12/24/2021)   Social Connection and Isolation Panel [NHANES]    Frequency of Communication with Friends and Family: More than three times a week    Frequency of Social Gatherings with Friends and Family: Once a week    Attends Religious Services: 1 to 4 times per year    Active Member of [employer] or Organizations: No    Attends Archivist Meetings: Never    Marital Status: Divorced  Arboriculturist Violence: Not At Risk (06/27/2021)   Humiliation, Afraid, Rape, and Kick questionnaire    Fear of Current or Ex-Partner: No    Emotionally Abused: No    Physically Abused: No    Sexually Abused: No    Outpatient Medications Prior to Visit  Medication Sig Dispense Refill   amLODipine (NORVASC) 10 MG tablet TAKE ONE TABLET DAILY 90 tablet 1   Ascorbic Acid (VITAMIN C PO) Take 1 tablet by mouth daily.     Cholecalciferol (VITAMIN D PO) Take 1 tablet by mouth daily.     levothyroxine (SYNTHROID) 50 MCG tablet Take 1 tablet (50 mcg total) by mouth every other day. 90 tablet 0   losartan (COZAAR) 50 MG tablet TAKE ONE TABLET EACH DAY 90 tablet 1   meloxicam (MOBIC) 7.5 MG tablet TAKE ONE TABLET DAILY 90 tablet 1   potassium chloride (KLOR-CON) 10 MEQ tablet TAKE ONE TABLET EACH DAY 90 tablet 1   rosuvastatin (CRESTOR) 10 MG tablet TAKE ONE TABLET DAILY 90 tablet 1   No facility-administered medications prior to visit.    No Known Allergies  ROS     Objective:    Physical Exam Constitutional:      General: She is not in acute distress.    Appearance: She is not ill-appearing.     Comments: Sitting in wheelchair  HENT:     Mouth/Throat:     Mouth: Mucous membranes are moist.  Eyes:     Extraocular Movements: Extraocular movements intact.     Pupils: Pupils are equal, round, and reactive to light.  Cardiovascular:     Rate and Rhythm: Normal rate and regular rhythm.  Pulmonary:     Effort: Pulmonary effort is normal.     Breath sounds: Normal breath sounds.  Chest:     Chest wall: Tenderness present.       Comments: Right lateral and anterior rib tenderness without crepitus.  Abdominal:     General: There is no distension.     Tenderness: There is no abdominal tenderness.  Musculoskeletal:     Right shoulder: Tenderness present. Decreased range of motion. Decreased strength. Normal pulse.     Right upper arm: No swelling or tenderness.     Right hand: No swelling.  Normal range of motion. Decreased strength. Normal sensation. Normal capillary refill. Normal pulse.     Cervical back: Normal range of motion and neck  supple. No tenderness.     Right hip: Tenderness present. Decreased range of motion.     Right knee: Normal. No swelling. No tenderness.     Right lower leg: No edema.     Left lower leg: No edema.     Comments: Right lateral and anterior hip tenderness, unable to bear wear. Unable to perform full hip exam due to patient sitting in wheelchair. RLE is neurovascularly intact. No sign of DVT or infection.   Skin:    General: Skin is warm and dry.  Neurological:     Mental Status: She is alert and oriented to person, place, and time.     Cranial Nerves: No cranial nerve deficit.     Sensory: No sensory deficit.     Motor: Weakness present.     Gait: Gait normal.     Comments: Right arm weakness and right grip weakness. RUE is neurovascularly intact.   Psychiatric:        Mood and Affect: Mood normal.     BP 124/70 (BP Location: Left Arm, Patient Position: Sitting, Cuff Size: Normal)   Pulse 91   Temp (!) 97.4 F (36.3 C) (Temporal)   Ht '5\' 2"'$  (1.575 m)   SpO2 99%   BMI 21.00 kg/m  Wt Readings from Last 3 Encounters:  06/16/22 114 lb 12.8 oz (52.1 kg)  12/18/21 125 lb (56.7 kg)  06/10/21 127 lb (57.6 kg)       Assessment & Plan:   Problem List Items Addressed This Visit       Endocrine   Hypothyroid    TSH ordered.  Apparently she is taking levothyroxine 50 mcg every other day.  Son would like to know if this is the correct dose for her.  Previously they were taking 75 mcg of levothyroxine every other day as well.  They are out of this dose.        Relevant Orders   TSH (Completed)     Other   Acute pain of right shoulder   Relevant Orders   DG Shoulder Right (Completed)   Ambulatory referral to Orthopedic Surgery   Decreased ROM of right shoulder    History of arthroplasty and x-ray shows no fracture and prosthesis  intact.  Discussed pain management.  Referral to orthopedics      Relevant Orders   DG Shoulder Right (Completed)   Ambulatory referral to Orthopedic Surgery   Rib tenderness    X-ray negative.  Continue meloxicam and may add Tylenol.  Recommend heating pad.      Relevant Orders   DG Ribs Unilateral Right (Completed)   Right hip pain - Primary    RLE is neurovascularly intact.  She is comfortable sitting but unable to stand without severe pain.  X-ray hip and pelvis ordered.  Results show degenerative changes but no fracture or AVN.  Continue taking meloxicam.  May add Tylenol.  Recommend heat or ice.  urgent referral to orthopedics made.      Relevant Orders   Ambulatory referral to Orthopedic Surgery   Unable to walk    Avascular necrosis ruled out.  Unable to walk due to pain, no significant leg weakness and sensation is intact.  Her son helps her walk at home.  Urgent referral to Ortho placed.      Relevant Orders   Ambulatory referral to Orthopedic Surgery   Patient is a poor historian. Denies fall or injury. She is comfortable appearing sitting in the wheelchair. No  acute distress.  Unable to stand due to hip pain.  Refuses to go to the hospital.  Right shoulder pain with limited ROM, right rib tenderness and right hip pain and tenderness x 4 wks.  Stat X rays ordered of R shoulder, R ribs, R hip and pelvis.      I am having Awa I. Ferrone maintain her Ascorbic Acid (VITAMIN C PO), Cholecalciferol (VITAMIN D PO), rosuvastatin, meloxicam, levothyroxine, potassium chloride, amLODipine, and losartan.  No orders of the defined types were placed in this encounter.

## 2022-08-06 NOTE — Assessment & Plan Note (Signed)
X-ray negative.  Continue meloxicam and may add Tylenol.  Recommend heating pad.

## 2022-08-06 NOTE — Progress Notes (Signed)
Results given in office

## 2022-08-07 ENCOUNTER — Encounter: Payer: Self-pay | Admitting: Physician Assistant

## 2022-08-07 ENCOUNTER — Ambulatory Visit (INDEPENDENT_AMBULATORY_CARE_PROVIDER_SITE_OTHER): Payer: Medicare Other | Admitting: Physician Assistant

## 2022-08-07 DIAGNOSIS — M25551 Pain in right hip: Secondary | ICD-10-CM
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
## 2022-08-17 ENCOUNTER — Telehealth: Payer: Self-pay | Admitting: Internal Medicine

## 2022-08-17 NOTE — Telephone Encounter (Signed)
Pt's son came in to drop off FMLA paperwork he needs filled out for his job. Pt stated that he would like for [place] to fax his paperwork off to [phone] and he would also like to be called when they are done so he can come and pick up a copy of forms. Pt also stated that he still hasn't heard anything about his mother's thyroid medication. Paperwork was placed in Dr. Adah Salvage box at the front. Pt's son can be reached at [phone].

## 2022-08-19 DIAGNOSIS — Z0289 Encounter for other administrative examinations: Secondary | ICD-10-CM

## 2022-08-19 NOTE — Telephone Encounter (Signed)
FMLA completed and given to PCP to review and sign.

## 2022-08-19 NOTE — Telephone Encounter (Signed)
FMLA has been faxed back.  Pt's son aware that originals are ready for pick up.  Copy sent to charge Copy filed with [place]

## 2022-08-20 ENCOUNTER — Telehealth: Payer: Self-pay | Admitting: Internal Medicine

## 2022-08-20 NOTE — Telephone Encounter (Signed)
Patient's son came and picked up FMLA paperwork from front desk.

## 2022-08-21 ENCOUNTER — Other Ambulatory Visit: Payer: Self-pay | Admitting: Internal Medicine

## 2022-08-21 DIAGNOSIS — E7800 Pure hypercholesterolemia, unspecified: Secondary | ICD-10-CM

## 2022-09-23 ENCOUNTER — Encounter: Payer: Self-pay | Admitting: Internal Medicine

## 2022-09-23 ENCOUNTER — Ambulatory Visit (INDEPENDENT_AMBULATORY_CARE_PROVIDER_SITE_OTHER): Payer: Medicare Other | Admitting: Internal Medicine

## 2022-09-23 VITALS — BP 116/64 | HR 63 | Temp 98.0°F | Ht 62.0 in | Wt 113.0 lb

## 2022-09-23 DIAGNOSIS — E118 Type 2 diabetes mellitus with unspecified complications: Secondary | ICD-10-CM | POA: Diagnosis not present

## 2022-09-23 DIAGNOSIS — M160 Bilateral primary osteoarthritis of hip: Secondary | ICD-10-CM

## 2022-09-23 DIAGNOSIS — I10 Essential (primary) hypertension: Secondary | ICD-10-CM

## 2022-09-23 DIAGNOSIS — E039 Hypothyroidism, unspecified: Secondary | ICD-10-CM | POA: Diagnosis not present

## 2022-09-23 DIAGNOSIS — Z23 Encounter for immunization: Secondary | ICD-10-CM | POA: Diagnosis not present

## 2022-09-23 NOTE — Progress Notes (Signed)
Subjective:  Patient ID: Jackie Horton, female    DOB: Nov 17, 1932  Age: 88 y.o. MRN: [id number]  CC: Hypertension, Hypothyroidism, and Osteoarthritis   HPI KEONNA RAETHER presents for f/up -  Her son tells me her right hip pain has improved since she started taking meloxicam.  Outpatient Medications Prior to Visit  Medication Sig Dispense Refill   amLODipine (NORVASC) 10 MG tablet TAKE ONE TABLET DAILY 90 tablet 1   Ascorbic Acid (VITAMIN C PO) Take 1 tablet by mouth daily.     Cholecalciferol (VITAMIN D PO) Take 1 tablet by mouth daily.     levothyroxine (SYNTHROID) 50 MCG tablet Take 1 tablet (50 mcg total) by mouth every other day. 90 tablet 0   losartan (COZAAR) 50 MG tablet TAKE ONE TABLET EACH DAY 90 tablet 1   meloxicam (MOBIC) 7.5 MG tablet TAKE ONE TABLET DAILY 90 tablet 1   potassium chloride (KLOR-CON) 10 MEQ tablet TAKE ONE TABLET EACH DAY 90 tablet 1   rosuvastatin (CRESTOR) 10 MG tablet TAKE ONE TABLET DAILY 90 tablet 1   No facility-administered medications prior to visit.    ROS Review of Systems  Constitutional: Negative.  Negative for appetite change, fatigue and unexpected weight change.  HENT: Negative.    Eyes: Negative.   Respiratory: Negative.  Negative for cough and shortness of breath.   Cardiovascular:  Negative for chest pain and leg swelling.  Gastrointestinal:  Negative for abdominal pain, diarrhea, nausea and vomiting.  Genitourinary: Negative.  Negative for difficulty urinating.  Musculoskeletal:  Positive for arthralgias.  Skin: Negative.   Neurological: Negative.  Negative for dizziness and weakness.  Hematological:  Negative for adenopathy. Does not bruise/bleed easily.  Psychiatric/Behavioral: Negative.      Objective:  BP 116/64 (BP Location: Right Arm, Patient Position: Sitting, Cuff Size: Large)   Pulse 63   Temp 98 F (36.7 C) (Oral)   Ht '5\' 2"'$  (1.575 m)   Wt 113 lb (51.3 kg)   SpO2 99%   BMI 20.67 kg/m   BP Readings  from Last 3 Encounters:  09/23/22 116/64  08/06/22 124/70  06/16/22 130/78    Wt Readings from Last 3 Encounters:  09/23/22 113 lb (51.3 kg)  06/16/22 114 lb 12.8 oz (52.1 kg)  12/18/21 125 lb (56.7 kg)    Physical Exam Vitals reviewed.  Eyes:     General: No scleral icterus.    Conjunctiva/sclera: Conjunctivae normal.  Cardiovascular:     Rate and Rhythm: Normal rate and regular rhythm.     Heart sounds: No murmur heard. Pulmonary:     Effort: Pulmonary effort is normal.     Breath sounds: No stridor. No wheezing, rhonchi or rales.  Abdominal:     General: Abdomen is flat.     Palpations: There is no mass.     Tenderness: There is no abdominal tenderness. There is no guarding.     Hernia: No hernia is present.  Musculoskeletal:        General: Normal range of motion.     Cervical back: Neck supple.     Right lower leg: No edema.     Left lower leg: No edema.  Lymphadenopathy:     Cervical: No cervical adenopathy.  Skin:    General: Skin is warm.  Neurological:     General: No focal deficit present.     Mental Status: She is alert.  Psychiatric:        Mood and Affect: Mood  normal.     Lab Results  Component Value Date   WBC 9.4 06/16/2022   HGB 15.0 06/16/2022   HCT 45.2 06/16/2022   PLT 257.0 06/16/2022   GLUCOSE 123 (H) 06/16/2022   CHOL 117 06/16/2022   TRIG 97.0 06/16/2022   HDL 38.20 (L) 06/16/2022   LDLCALC 60 06/16/2022   ALT 22 06/16/2022   AST 23 06/16/2022   NA 140 06/16/2022   K 3.9 06/16/2022   CL 104 06/16/2022   CREATININE 0.99 06/16/2022   BUN 16 06/16/2022   CO2 23 06/16/2022   TSH 0.56 08/06/2022   INR 1.0 05/16/2009   HGBA1C 6.3 06/16/2022   MICROALBUR 4.7 (H) 06/16/2022    MR Brain Wo Contrast  Result Date: 12/20/2018 CLINICAL DATA:  Falling over the last week.  Confusion and weakness. EXAM: MRI HEAD WITHOUT CONTRAST TECHNIQUE: Multiplanar, multiecho pulse sequences of the brain and surrounding structures were obtained  without intravenous contrast. COMPARISON:  07/11/2013.  01/13/2012. FINDINGS: Brain: Diffusion imaging does not show any acute or subacute infarction. There chronic small-vessel ischemic changes of the pons. No focal cerebellar insult. Cerebral hemispheres show old small vessel infarctions in the left lateral thalamus and left basal ganglia. There is an old right posterior frontal cortical and subcortical infarction. There are mild chronic small-vessel ischemic changes of the deep white matter. There is some hemosiderin deposition in the region of the old infarctions in the left thalamus and basal ganglia. There could be a developmental venous anomaly in that region. No sign of mass, acute hemorrhage, hydrocephalus or extra-axial collection. Vascular: Major vessels at the base of the brain show flow. Skull and upper cervical spine: Chronic arthropathy at the C1-2 articulation but without encroachment upon the spinal canal. Sinuses/Orbits: Clear/normal Other: None IMPRESSION: No acute or reversible finding. Chronic small-vessel ischemic changes of the pons and cerebral hemispheric white matter. 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 I wonder if there is a developmental venous anomaly in this region as well. If so, this is obviously lifelong and would not be specifically treatable in any way. Electronically Signed   By: Nelson Chimes M.D.   On: 12/20/2018 08:01   DG Shoulder Right  Addendum Date: 08/06/2022   ADDENDUM REPORT: 08/06/2022 10:24 ADDENDUM: Both hips are normally located. Moderate age related degenerative changes but no fracture or AVN. The pubic symphysis and SI joints appear intact. Suspect remote healed left-sided pubic rami fractures. Electronically Signed   By: Marijo Sanes M.D.   On: 08/06/2022 10:24   Result Date: 08/06/2022 CLINICAL DATA:  Right shoulder pain and limited range of motion. History of right shoulder arthroplasty.  EXAM: DG HIP (WITH OR WITHOUT PELVIS) 2-3V RIGHT; RIGHT SHOULDER - 2+ VIEW COMPARISON:  05/23/2009 FINDINGS: The humeral prosthesis is intact. No complicating features are identified. No glenoid component is identified. The prosthesis is riding high in the glenoid fossa and appears to articulate with the acromion. This is also stable and likely due to a chronic full-thickness retracted rotator cuff tear. No acute bony findings. The scapula and visualized right ribs are intact. IMPRESSION: 1. No acute bony findings. 2. Intact right humeral prosthesis. Electronically Signed: By: Marijo Sanes M.D. On: 08/06/2022 09:31   DG HIP UNILAT WITH PELVIS 2-3 VIEWS RIGHT  Addendum Date: 08/06/2022   ADDENDUM REPORT: 08/06/2022 10:24 ADDENDUM: Both hips are normally located. Moderate age related degenerative changes but no fracture or AVN. The pubic symphysis and SI  joints appear intact. Suspect remote healed left-sided pubic rami fractures. Electronically Signed   By: Marijo Sanes M.D.   On: 08/06/2022 10:24   Result Date: 08/06/2022 CLINICAL DATA:  Right shoulder pain and limited range of motion. History of right shoulder arthroplasty. EXAM: DG HIP (WITH OR WITHOUT PELVIS) 2-3V RIGHT; RIGHT SHOULDER - 2+ VIEW COMPARISON:  05/23/2009 FINDINGS: The humeral prosthesis is intact. No complicating features are identified. No glenoid component is identified. The prosthesis is riding high in the glenoid fossa and appears to articulate with the acromion. This is also stable and likely due to a chronic full-thickness retracted rotator cuff tear. No acute bony findings. The scapula and visualized right ribs are intact. IMPRESSION: 1. No acute bony findings. 2. Intact right humeral prosthesis. Electronically Signed: By: Marijo Sanes M.D. On: 08/06/2022 09:31   DG Ribs Unilateral Right  Result Date: 08/06/2022 CLINICAL DATA:  Right rib pain EXAM: RIGHT RIBS - 2 VIEW COMPARISON:  None Available. FINDINGS: No acute fracture or  other bone lesions are seen involving the ribs. Visualized lungs are clear. Prior total right shoulder arthroplasty. IMPRESSION: Negative. Electronically Signed   By: Yetta Glassman M.D.   On: 08/06/2022 09:32     Assessment & Plan:   Zakya was seen today for hypertension, hypothyroidism and osteoarthritis.  Diagnoses and all orders for this visit:  Primary hypertension- Her blood pressure is well-controlled.  Acquired hypothyroidism - She is euthyroid.  Type II diabetes mellitus with manifestations ([place])- Her blood sugar is adequately well-controlled.  Flu vaccine need -     Flu Vaccine QUAD High Dose(Fluad)  Primary osteoarthritis of both hips- Improvement noted with meloxicam.  Other orders -     Pneumococcal polysaccharide vaccine 23-valent greater than or equal to 2yo subcutaneous/IM   I am having Analya I. Sellitto maintain her Ascorbic Acid (VITAMIN C PO), Cholecalciferol (VITAMIN D PO), meloxicam, levothyroxine, potassium chloride, amLODipine, losartan, and rosuvastatin.  No orders of the defined types were placed in this encounter.    Follow-up: Return in about 3 months (around 12/24/2022).  Scarlette Calico, MD

## 2022-09-23 NOTE — Patient Instructions (Signed)

## 2022-10-12 ENCOUNTER — Other Ambulatory Visit: Payer: Self-pay | Admitting: Internal Medicine

## 2022-12-21 ENCOUNTER — Ambulatory Visit: Payer: Medicare Other | Admitting: Internal Medicine

## 2022-12-24 ENCOUNTER — Ambulatory Visit (INDEPENDENT_AMBULATORY_CARE_PROVIDER_SITE_OTHER): Payer: Medicare Other | Admitting: Internal Medicine

## 2022-12-24 ENCOUNTER — Encounter: Payer: Self-pay | Admitting: Internal Medicine

## 2022-12-24 VITALS — BP 128/78 | HR 93 | Temp 97.5°F | Resp 16 | Ht 62.0 in | Wt 117.0 lb

## 2022-12-24 DIAGNOSIS — E118 Type 2 diabetes mellitus with unspecified complications: Secondary | ICD-10-CM | POA: Diagnosis not present

## 2022-12-24 DIAGNOSIS — E039 Hypothyroidism, unspecified: Secondary | ICD-10-CM

## 2022-12-24 DIAGNOSIS — I10 Essential (primary) hypertension: Secondary | ICD-10-CM

## 2022-12-24 DIAGNOSIS — N184 Chronic kidney disease, stage 4 (severe): Secondary | ICD-10-CM

## 2022-12-24 LAB — CBC WITH DIFFERENTIAL/PLATELET
Basophils Absolute: 0.1 10*3/uL (ref 0.0–0.1)
Basophils Relative: 1 % (ref 0.0–3.0)
Eosinophils Absolute: 0.1 10*3/uL (ref 0.0–0.7)
Eosinophils Relative: 0.9 % (ref 0.0–5.0)
HCT: 45.4 % (ref 36.0–46.0)
Hemoglobin: 15.1 g/dL — ABNORMAL HIGH (ref 12.0–15.0)
Lymphocytes Relative: 39.7 % (ref 12.0–46.0)
Lymphs Abs: 3.8 10*3/uL (ref 0.7–4.0)
MCHC: 33.3 g/dL (ref 30.0–36.0)
MCV: 93.5 fl (ref 78.0–100.0)
Monocytes Absolute: 0.5 10*3/uL (ref 0.1–1.0)
Monocytes Relative: 5 % (ref 3.0–12.0)
Neutro Abs: 5.1 10*3/uL (ref 1.4–7.7)
Neutrophils Relative %: 53.4 % (ref 43.0–77.0)
Platelets: 298 10*3/uL (ref 150.0–400.0)
RBC: 4.85 Mil/uL (ref 3.87–5.11)
RDW: 15 % (ref 11.5–15.5)
WBC: 9.6 10*3/uL (ref 4.0–10.5)

## 2022-12-24 LAB — BASIC METABOLIC PANEL
BUN: 21 mg/dL (ref 6–23)
CO2: 23 mEq/L (ref 19–32)
Calcium: 11.1 mg/dL — ABNORMAL HIGH (ref 8.4–10.5)
Chloride: 106 mEq/L (ref 96–112)
Creatinine, Ser: 1.01 mg/dL (ref 0.40–1.20)
GFR: 50.07 mL/min — ABNORMAL LOW (ref >60.00)
Glucose, Bld: 118 mg/dL — ABNORMAL HIGH (ref 70–99)
Potassium: 3.8 mEq/L (ref 3.5–5.1)
Sodium: 139 mEq/L (ref 135–145)

## 2022-12-24 LAB — HEPATIC FUNCTION PANEL
ALT: 51 U/L — ABNORMAL HIGH (ref 0–35)
AST: 37 U/L (ref 0–37)
Albumin: 4.7 g/dL (ref 3.5–5.2)
Alkaline Phosphatase: 102 U/L (ref 39–117)
Bilirubin, Direct: 0.1 mg/dL (ref 0.0–0.3)
Total Bilirubin: 0.6 mg/dL (ref 0.2–1.2)
Total Protein: 8.1 g/dL (ref 6.0–8.3)

## 2022-12-24 LAB — TSH: TSH: 31.85 u[IU]/mL — ABNORMAL HIGH (ref 0.35–5.50)

## 2022-12-24 LAB — HEMOGLOBIN A1C: Hgb A1c MFr Bld: 6.2 % (ref 4.6–6.5)

## 2022-12-24 LAB — VITAMIN D 25 HYDROXY (VIT D DEFICIENCY, FRACTURES): VITD: 44.19 ng/mL (ref 30.00–100.00)

## 2022-12-24 MED ORDER — LEVOTHYROXINE SODIUM 50 MCG PO TABS: 50.0000 ug | ORAL_TABLET | ORAL | 0 refills | Status: DC

## 2022-12-24 NOTE — Patient Instructions (Signed)

## 2022-12-24 NOTE — Progress Notes (Signed)
Subjective:  Patient ID: Jackie Horton, female    DOB: 08/05/1933  Age: 89 y.o. MRN: [id number]  CC: Hypertension, Hyperlipidemia, Diabetes, and Hypothyroidism   HPI Jackie Horton presents for f/up ----  According to prescription refills she is not currently taking T4.  She tells me she is no longer taking anything for pain.  Her neurological symptoms are at baseline.  She denies chest pain, shortness of breath, abdominal pain, or edema.  Outpatient Medications Prior to Visit  Medication Sig Dispense Refill   amLODipine (NORVASC) 10 MG tablet TAKE ONE TABLET DAILY 90 tablet 1   Ascorbic Acid (VITAMIN C PO) Take 1 tablet by mouth daily.     Cholecalciferol (VITAMIN D PO) Take 1 tablet by mouth daily.     losartan (COZAAR) 50 MG tablet TAKE ONE TABLET EACH DAY 90 tablet 1   potassium chloride (KLOR-CON) 10 MEQ tablet TAKE ONE TABLET EACH DAY 90 tablet 1   rosuvastatin (CRESTOR) 10 MG tablet TAKE ONE TABLET DAILY 90 tablet 1   levothyroxine (SYNTHROID) 50 MCG tablet Take 1 tablet (50 mcg total) by mouth every other day. 90 tablet 0   meloxicam (MOBIC) 7.5 MG tablet TAKE ONE TABLET DAILY 90 tablet 0   No facility-administered medications prior to visit.    ROS Review of Systems  Constitutional: Negative.  Negative for diaphoresis and fatigue.  HENT: Negative.    Eyes: Negative.   Respiratory:  Negative for cough, chest tightness, shortness of breath and wheezing.   Cardiovascular:  Negative for chest pain, palpitations and leg swelling.  Gastrointestinal:  Negative for abdominal pain, constipation, diarrhea, nausea and vomiting.  Endocrine: Positive for cold intolerance. Negative for heat intolerance.  Genitourinary: Negative.  Negative for difficulty urinating.  Musculoskeletal:  Negative for myalgias.  Skin: Negative.   Neurological:  Negative for dizziness and weakness.  Hematological:  Negative for adenopathy. Does not bruise/bleed easily.  Psychiatric/Behavioral:  Negative.      Objective:  BP 128/78 (BP Location: Left Arm, Patient Position: Sitting, Cuff Size: Normal)   Pulse 93   Temp (!) 97.5 F (36.4 C) (Oral)   Resp 16   Ht '5\' 2"'$  (1.575 m)   Wt 117 lb (53.1 kg)   SpO2 96%   BMI 21.40 kg/m   BP Readings from Last 3 Encounters:  12/24/22 128/78  09/23/22 116/64  08/06/22 124/70    Wt Readings from Last 3 Encounters:  12/24/22 117 lb (53.1 kg)  09/23/22 113 lb (51.3 kg)  06/16/22 114 lb 12.8 oz (52.1 kg)    Physical Exam Vitals reviewed.  Constitutional:      General: She is not in acute distress.    Appearance: She is not toxic-appearing or diaphoretic.  HENT:     Mouth/Throat:     Mouth: Mucous membranes are moist.  Eyes:     General: No scleral icterus.    Conjunctiva/sclera: Conjunctivae normal.  Cardiovascular:     Rate and Rhythm: Normal rate and regular rhythm.     Heart sounds: No murmur heard. Pulmonary:     Effort: Pulmonary effort is normal.     Breath sounds: No stridor. No wheezing, rhonchi or rales.  Abdominal:     General: Abdomen is flat.     Palpations: There is no mass.     Tenderness: There is no abdominal tenderness. There is no guarding.     Hernia: No hernia is present.  Musculoskeletal:        General: Normal  range of motion.     Cervical back: Neck supple.     Right lower leg: No edema.     Left lower leg: No edema.  Lymphadenopathy:     Cervical: No cervical adenopathy.  Skin:    General: Skin is warm and dry.  Neurological:     General: No focal deficit present.     Mental Status: Mental status is at baseline.  Psychiatric:        Mood and Affect: Mood normal.        Behavior: Behavior normal.     Lab Results  Component Value Date   WBC 9.6 12/24/2022   HGB 15.1 (H) 12/24/2022   HCT 45.4 12/24/2022   PLT 298.0 12/24/2022   GLUCOSE 118 (H) 12/24/2022   CHOL 117 06/16/2022   TRIG 97.0 06/16/2022   HDL 38.20 (L) 06/16/2022   LDLCALC 60 06/16/2022   ALT 51 (H) 12/24/2022    AST 37 12/24/2022   NA 139 12/24/2022   K 3.8 12/24/2022   CL 106 12/24/2022   CREATININE 1.01 12/24/2022   BUN 21 12/24/2022   CO2 23 12/24/2022   TSH 31.85 (H) 12/24/2022   INR 1.0 05/16/2009   HGBA1C 6.2 12/24/2022   MICROALBUR 4.7 (H) 06/16/2022    MR Brain Wo Contrast  Result Date: 12/20/2018 CLINICAL DATA:  Falling over the last week.  Confusion and weakness. EXAM: MRI HEAD WITHOUT CONTRAST TECHNIQUE: Multiplanar, multiecho pulse sequences of the brain and surrounding structures were obtained without intravenous contrast. COMPARISON:  07/11/2013.  01/13/2012. FINDINGS: Brain: Diffusion imaging does not show any acute or subacute infarction. There chronic small-vessel ischemic changes of the pons. No focal cerebellar insult. Cerebral hemispheres show old small vessel infarctions in the left lateral thalamus and left basal ganglia. There is an old right posterior frontal cortical and subcortical infarction. There are mild chronic small-vessel ischemic changes of the deep white matter. There is some hemosiderin deposition in the region of the old infarctions in the left thalamus and basal ganglia. There could be a developmental venous anomaly in that region. No sign of mass, acute hemorrhage, hydrocephalus or extra-axial collection. Vascular: Major vessels at the base of the brain show flow. Skull and upper cervical spine: Chronic arthropathy at the C1-2 articulation but without encroachment upon the spinal canal. Sinuses/Orbits: Clear/normal Other: None IMPRESSION: No acute or reversible finding. Chronic small-vessel ischemic changes of the pons and cerebral hemispheric white matter. 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 I wonder if there is a developmental venous anomaly in this region as well. If so, this is obviously lifelong and would not be specifically treatable in any way. Electronically Signed   By: Nelson Chimes M.D.   On:  12/20/2018 08:01    Assessment & Plan:   Jackie Horton was seen today for hypertension, hyperlipidemia, diabetes and hypothyroidism.  Diagnoses and all orders for this visit:  Primary hypertension- Her blood pressure is adequately well-controlled. -     Basic metabolic panel; Future -     CBC with Differential/Platelet; Future -     Hepatic function panel; Future -     Hepatic function panel -     CBC with Differential/Platelet -     Basic metabolic panel  Acquired hypothyroidism- Will restart T4. -     CBC with Differential/Platelet; Future -     TSH; Future -     TSH -     CBC with  Differential/Platelet -     levothyroxine (SYNTHROID) 50 MCG tablet; Take 1 tablet (50 mcg total) by mouth every other day.  Type II diabetes mellitus with manifestations ([place])- Her blood sugar is adequately well-controlled. -     Basic metabolic panel; Future -     Hepatic function panel; Future -     Hemoglobin A1c; Future -     HM Diabetes Foot Exam -     Hemoglobin A1c -     Hepatic function panel -     Basic metabolic panel  Chronic renal disease, stage 4, severely decreased glomerular filtration rate (GFR) between 15-29 mL/min/1.73 square meter ([place])- Her renal function is stable. -     Basic metabolic panel; Future -     Basic metabolic panel  Hypercalcemia- Will continue to follow this. -     Basic metabolic panel; Future -     VITAMIN D 25 Hydroxy (Vit-D Deficiency, Fractures); Future -     PTH, intact and calcium; Future -     PTH, intact and calcium -     VITAMIN D 25 Hydroxy (Vit-D Deficiency, Fractures) -     Basic metabolic panel   I have discontinued Jackie Horton's meloxicam. I am also having her maintain her Ascorbic Acid (VITAMIN C PO), Cholecalciferol (VITAMIN D PO), potassium chloride, amLODipine, losartan, rosuvastatin, and levothyroxine.  Meds ordered this encounter  Medications   levothyroxine (SYNTHROID) 50 MCG tablet    Sig: Take 1 tablet (50 mcg total) by  mouth every other day.    Dispense:  90 tablet    Refill:  0     Follow-up: Return in about 6 months (around 06/24/2023).  Scarlette Calico, MD

## 2022-12-26 LAB — PTH, INTACT AND CALCIUM
Calcium: 10.9 mg/dL — ABNORMAL HIGH (ref 8.6–10.4)
PTH: 44 pg/mL (ref 16–77)

## 2023-01-01 ENCOUNTER — Other Ambulatory Visit: Payer: Self-pay | Admitting: Internal Medicine

## 2023-01-01 DIAGNOSIS — I10 Essential (primary) hypertension: Secondary | ICD-10-CM

## 2023-01-08 ENCOUNTER — Other Ambulatory Visit: Payer: Self-pay | Admitting: Internal Medicine

## 2023-01-09 ENCOUNTER — Other Ambulatory Visit: Payer: Self-pay | Admitting: Internal Medicine

## 2023-01-09 DIAGNOSIS — I10 Essential (primary) hypertension: Secondary | ICD-10-CM

## 2023-01-09 MED ORDER — AMLODIPINE BESYLATE 10 MG PO TABS: 10.0000 mg | ORAL_TABLET | Freq: Every day | ORAL | 1 refills | Status: DC

## 2023-01-12 ENCOUNTER — Other Ambulatory Visit: Payer: Self-pay | Admitting: Internal Medicine

## 2023-01-12 ENCOUNTER — Telehealth: Payer: Self-pay | Admitting: Internal Medicine

## 2023-01-12 NOTE — Telephone Encounter (Signed)
Med is not on med list. Pls advise if ok.Marland KitchenJohny Horton

## 2023-01-12 NOTE — Telephone Encounter (Signed)
Prescription Request  01/12/2023  LOV: 12/24/2022  What is the name of the medication or equipment? Meloxicam 7.5 mg  Have you contacted your pharmacy to request a refill? No   Which pharmacy would you like this sent to?  [place], [place] - [address] [address] Phone: [phone] Fax: [phone]    Patient notified that their request is being sent to the clinical staff for review and that they should receive a response within 2 business days.   Please advise at Mobile [phone] (mobile)   Medication appears to have been taken off med list, but patient is still taking it.

## 2023-01-19 ENCOUNTER — Other Ambulatory Visit: Payer: Self-pay | Admitting: Internal Medicine

## 2023-01-19 DIAGNOSIS — I10 Essential (primary) hypertension: Secondary | ICD-10-CM

## 2023-01-19 DIAGNOSIS — E118 Type 2 diabetes mellitus with unspecified complications: Secondary | ICD-10-CM

## 2023-02-19 ENCOUNTER — Other Ambulatory Visit: Payer: Self-pay | Admitting: Internal Medicine

## 2023-02-19 DIAGNOSIS — E7800 Pure hypercholesterolemia, unspecified: Secondary | ICD-10-CM

## 2023-04-21 ENCOUNTER — Other Ambulatory Visit: Payer: Self-pay | Admitting: Internal Medicine

## 2023-04-21 DIAGNOSIS — I10 Essential (primary) hypertension: Secondary | ICD-10-CM

## 2023-04-21 DIAGNOSIS — E118 Type 2 diabetes mellitus with unspecified complications: Secondary | ICD-10-CM

## 2023-06-10 ENCOUNTER — Ambulatory Visit (INDEPENDENT_AMBULATORY_CARE_PROVIDER_SITE_OTHER): Payer: Medicare Other | Admitting: Internal Medicine

## 2023-06-10 ENCOUNTER — Encounter: Payer: Self-pay | Admitting: Internal Medicine

## 2023-06-10 VITALS — BP 128/76 | HR 91 | Temp 97.9°F | Ht 62.0 in | Wt 119.0 lb

## 2023-06-10 DIAGNOSIS — E039 Hypothyroidism, unspecified: Secondary | ICD-10-CM

## 2023-06-10 DIAGNOSIS — N184 Chronic kidney disease, stage 4 (severe): Secondary | ICD-10-CM | POA: Diagnosis not present

## 2023-06-10 DIAGNOSIS — I10 Essential (primary) hypertension: Secondary | ICD-10-CM | POA: Diagnosis not present

## 2023-06-10 DIAGNOSIS — E118 Type 2 diabetes mellitus with unspecified complications: Secondary | ICD-10-CM

## 2023-06-10 DIAGNOSIS — E7800 Pure hypercholesterolemia, unspecified: Secondary | ICD-10-CM | POA: Diagnosis not present

## 2023-06-10 LAB — BASIC METABOLIC PANEL
BUN: 13 mg/dL (ref 6–23)
CO2: 24 mEq/L (ref 19–32)
Calcium: 10.6 mg/dL — ABNORMAL HIGH (ref 8.4–10.5)
Chloride: 102 mEq/L (ref 96–112)
Creatinine, Ser: 1.13 mg/dL (ref 0.40–1.20)
GFR: 43.62 mL/min — ABNORMAL LOW (ref >60.00)
Glucose, Bld: 123 mg/dL — ABNORMAL HIGH (ref 70–99)
Potassium: 3.7 mEq/L (ref 3.5–5.1)
Sodium: 135 mEq/L (ref 135–145)

## 2023-06-10 LAB — HEPATIC FUNCTION PANEL
ALT: 39 U/L — ABNORMAL HIGH (ref 0–35)
AST: 28 U/L (ref 0–37)
Albumin: 4.8 g/dL (ref 3.5–5.2)
Alkaline Phosphatase: 83 U/L (ref 39–117)
Bilirubin, Direct: 0.2 mg/dL (ref 0.0–0.3)
Total Bilirubin: 0.8 mg/dL (ref 0.2–1.2)
Total Protein: 8.2 g/dL (ref 6.0–8.3)

## 2023-06-10 LAB — CBC WITH DIFFERENTIAL/PLATELET
Basophils Absolute: 0.1 10*3/uL (ref 0.0–0.1)
Basophils Relative: 1.2 % (ref 0.0–3.0)
Eosinophils Absolute: 0.1 10*3/uL (ref 0.0–0.7)
Eosinophils Relative: 0.8 % (ref 0.0–5.0)
HCT: 45.5 % (ref 36.0–46.0)
Hemoglobin: 14.6 g/dL (ref 12.0–15.0)
Lymphocytes Relative: 35.5 % (ref 12.0–46.0)
Lymphs Abs: 3.1 10*3/uL (ref 0.7–4.0)
MCHC: 32.2 g/dL (ref 30.0–36.0)
MCV: 94.4 fl (ref 78.0–100.0)
Monocytes Absolute: 0.5 10*3/uL (ref 0.1–1.0)
Monocytes Relative: 6.2 % (ref 3.0–12.0)
Neutro Abs: 4.9 10*3/uL (ref 1.4–7.7)
Neutrophils Relative %: 56.3 % (ref 43.0–77.0)
Platelets: 304 10*3/uL (ref 150.0–400.0)
RBC: 4.82 Mil/uL (ref 3.87–5.11)
RDW: 14.8 % (ref 11.5–15.5)
WBC: 8.7 10*3/uL (ref 4.0–10.5)

## 2023-06-10 LAB — LIPID PANEL
Cholesterol: 135 mg/dL (ref 0–200)
HDL: 47.7 mg/dL (ref >39.00)
LDL Cholesterol: 66 mg/dL (ref 0–99)
NonHDL: 87.47
Total CHOL/HDL Ratio: 3
Triglycerides: 107 mg/dL (ref 0.0–149.0)
VLDL: 21.4 mg/dL (ref 0.0–40.0)

## 2023-06-10 LAB — HEMOGLOBIN A1C: Hgb A1c MFr Bld: 6.3 % (ref 4.6–6.5)

## 2023-06-10 LAB — TSH: TSH: 25.31 u[IU]/mL — ABNORMAL HIGH (ref 0.35–5.50)

## 2023-06-10 MED ORDER — LEVOTHYROXINE SODIUM 75 MCG PO TABS
75.0000 ug | ORAL_TABLET | Freq: Every day | ORAL | 3 refills | Status: DC
Start: 2023-06-10 — End: 2023-12-14

## 2023-06-10 NOTE — Progress Notes (Signed)
Subjective:  Patient ID: Jackie Horton, female    DOB: 08/28/1933  Age: 89 y.o. MRN: [id number]  CC: Hypothyroidism   HPI Jackie Horton presents for f/up ----  Discussed the use of AI scribe software for clinical note transcription with the patient, who gave verbal consent to proceed.  History of Present Illness   The patient, with an unspecified medical history, reports feeling fine with no recent changes in health status. They deny experiencing chest pain, shortness of breath, dizziness, or lightheadedness. They also deny any changes in weight or appetite, stating they eat enough but not a lot. Bowel movements are reported as regular with no issues.  The patient lives independently and receives daily visits from their son. They deny any respiratory issues or leg swelling. The patient consents to lab work to check thyroid levels, although no symptoms or concerns related to thyroid function were reported.       Outpatient Medications Prior to Visit  Medication Sig Dispense Refill   amLODipine (NORVASC) 10 MG tablet Take 1 tablet (10 mg total) by mouth daily. 90 tablet 1   Ascorbic Acid (VITAMIN C PO) Take 1 tablet by mouth daily.     Cholecalciferol (VITAMIN D PO) Take 1 tablet by mouth daily.     levothyroxine (SYNTHROID) 50 MCG tablet Take 1 tablet (50 mcg total) by mouth every other day. 90 tablet 0   losartan (COZAAR) 50 MG tablet TAKE ONE TABLET EACH DAY 90 tablet 0   potassium chloride (KLOR-CON) 10 MEQ tablet TAKE ONE TABLET DAILY 90 tablet 1   rosuvastatin (CRESTOR) 10 MG tablet TAKE ONE TABLET DAILY 90 tablet 1   No facility-administered medications prior to visit.    ROS Review of Systems  Constitutional:  Negative for chills, diaphoresis, fatigue and unexpected weight change.  HENT: Negative.    Respiratory: Negative.  Negative for cough, chest tightness, shortness of breath and wheezing.   Cardiovascular:  Negative for chest pain, palpitations and leg  swelling.  Gastrointestinal:  Negative for abdominal pain, constipation, diarrhea, nausea and vomiting.  Genitourinary: Negative.   Musculoskeletal: Negative.   Skin: Negative.  Negative for color change.  Allergic/Immunologic: Negative.   Neurological: Negative.  Negative for dizziness.  Hematological:  Negative for adenopathy.  Psychiatric/Behavioral:  Positive for confusion and decreased concentration. The patient is not nervous/anxious.     Objective:  BP 128/76 (BP Location: Left Arm, Patient Position: Sitting, Cuff Size: Normal)   Pulse 91   Temp 97.9 F (36.6 C) (Oral)   Ht 5\' 2"  (1.575 m)   Wt 119 lb (54 kg)   SpO2 95%   BMI 21.77 kg/m   BP Readings from Last 3 Encounters:  06/10/23 128/76  12/24/22 128/78  09/23/22 116/64    Wt Readings from Last 3 Encounters:  06/10/23 119 lb (54 kg)  12/24/22 117 lb (53.1 kg)  09/23/22 113 lb (51.3 kg)    Physical Exam Vitals reviewed.  Constitutional:      Appearance: She is not ill-appearing.  Eyes:     General: No scleral icterus.    Conjunctiva/sclera: Conjunctivae normal.  Cardiovascular:     Rate and Rhythm: Normal rate and regular rhythm.     Heart sounds: No murmur heard. Pulmonary:     Effort: Pulmonary effort is normal.     Breath sounds: No stridor. No wheezing, rhonchi or rales.  Abdominal:     General: Abdomen is flat.     Palpations: There is no mass.  Tenderness: There is no abdominal tenderness. There is no guarding.     Hernia: No hernia is present.  Musculoskeletal:        General: Normal range of motion.     Cervical back: Neck supple.     Right lower leg: No edema.     Left lower leg: No edema.  Lymphadenopathy:     Cervical: No cervical adenopathy.  Skin:    General: Skin is warm and dry.  Neurological:     General: No focal deficit present.     Mental Status: She is alert.  Psychiatric:        Mood and Affect: Mood normal.        Behavior: Behavior normal.     Lab Results   Component Value Date   WBC 8.7 06/10/2023   HGB 14.6 06/10/2023   HCT 45.5 06/10/2023   PLT 304.0 06/10/2023   GLUCOSE 123 (H) 06/10/2023   CHOL 135 06/10/2023   TRIG 107.0 06/10/2023   HDL 47.70 06/10/2023   LDLCALC 66 06/10/2023   ALT 39 (H) 06/10/2023   AST 28 06/10/2023   NA 135 06/10/2023   K 3.7 06/10/2023   CL 102 06/10/2023   CREATININE 1.13 06/10/2023   BUN 13 06/10/2023   CO2 24 06/10/2023   TSH 25.31 (H) 06/10/2023   INR 1.0 05/16/2009   HGBA1C 6.3 06/10/2023   MICROALBUR 4.7 (H) 06/16/2022    MR Brain Wo Contrast  Result Date: 12/20/2018 CLINICAL DATA:  Falling over the last week.  Confusion and weakness. EXAM: MRI HEAD WITHOUT CONTRAST TECHNIQUE: Multiplanar, multiecho pulse sequences of the brain and surrounding structures were obtained without intravenous contrast. COMPARISON:  07/11/2013.  01/13/2012. FINDINGS: Brain: Diffusion imaging does not show any acute or subacute infarction. There chronic small-vessel ischemic changes of the pons. No focal cerebellar insult. Cerebral hemispheres show old small vessel infarctions in the left lateral thalamus and left basal ganglia. There is an old right posterior frontal cortical and subcortical infarction. There are mild chronic small-vessel ischemic changes of the deep white matter. There is some hemosiderin deposition in the region of the old infarctions in the left thalamus and basal ganglia. There could be a developmental venous anomaly in that region. No sign of mass, acute hemorrhage, hydrocephalus or extra-axial collection. Vascular: Major vessels at the base of the brain show flow. Skull and upper cervical spine: Chronic arthropathy at the C1-2 articulation but without encroachment upon the spinal canal. Sinuses/Orbits: Clear/normal Other: None IMPRESSION: No acute or reversible finding. Chronic small-vessel ischemic changes of the pons and cerebral hemispheric white matter. 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 I wonder if there is a developmental venous anomaly in this region as well. If so, this is obviously lifelong and would not be specifically treatable in any way. Electronically Signed   By: Paulina Fusi M.D.   On: 12/20/2018 08:01    Assessment & Plan:   Chronic renal disease, stage 4, severely decreased glomerular filtration rate (GFR) between 15-29 mL/min/1.73 square meter (HCC) -     CBC with Differential/Platelet; Future -     Basic metabolic panel; Future  Acquired hypothyroidism- I am not sure she is compliant with T4.  Will order a CCM. -     TSH; Future  Hypercholesteremia -     Lipid panel; Future -     TSH; Future -     Hepatic function panel; Future  Primary  hypertension -     TSH; Future -     CBC with Differential/Platelet; Future  Type II diabetes mellitus with manifestations (HCC) -     Hemoglobin A1c; Future -     Basic metabolic panel; Future     Follow-up: Return in about 4 months (around 10/10/2023).  Sanda Linger, MD

## 2023-06-10 NOTE — Patient Instructions (Signed)

## 2023-06-11 ENCOUNTER — Telehealth: Payer: Self-pay

## 2023-06-11 NOTE — Progress Notes (Signed)
   06/11/2023  Patient ID: Jackie Horton, female   DOB: Jan 26, 1933, 89 y.o.   MRN: [id number]  Patient outreach to scheduled a telephone visit for medication review in response to request received from patient's PCP, Dr. Yetta Barre.  Appointment is scheduled with patient's son, Jackie Horton), whom takes care of her medications.  Visit scheduled for next Friday at 830am.  Lenna Gilford, PharmD, DPLA

## 2023-06-18 ENCOUNTER — Other Ambulatory Visit: Payer: Medicare Other

## 2023-06-18 NOTE — Progress Notes (Signed)
06/18/2023 Name: Jackie Horton MRN: [id number] DOB: 1932/12/14  Chief Complaint  Patient presents with   Medication Management   Jackie Horton is a 89 y.o. year old female who presented for a telephone visit.  Spoke with patient's son, Jackie Horton [facility]), who assists with medications.   They were referred to the pharmacist by their PCP for medication review  Subjective:  Care Team: Primary Care Provider: Etta Grandchild, MD ; Next Scheduled Visit: 12/13/23  Medication Access/Adherence  Current Pharmacy:  [facility] - [place], [place] - [address] [place] [address] Phone: [phone] Fax: [phone]  -Patient reports affordability concerns with their medications: No  -Patient reports access/transportation concerns to their pharmacy: No  -Patient reports adherence concerns with their medications:  Yes  Patient is still taking levothyroxine every other day- unaware a new prescription was sent to pharmacy 8/15  Hypertension: Current medications: amlodipine 10mg  daily, losartan 50mg  daily  -Current blood pressure readings readings: 128/76 -Patient denies hypotensive s/sx including dizziness, lightheadedness.  -Patient denies hypertensive symptoms including headache, chest pain, shortness of breath  Hyperlipidemia/ASCVD Risk Reduction Current lipid lowering medications: rosuvastatin 10mg  daily  Antiplatelet regimen: None  Objective: Lab Results  Component Value Date   HGBA1C 6.3 06/10/2023   Lab Results  Component Value Date   CREATININE 1.13 06/10/2023   BUN 13 06/10/2023   NA 135 06/10/2023   K 3.7 06/10/2023   CL 102 06/10/2023   CO2 24 06/10/2023   Lab Results  Component Value Date   CHOL 135 06/10/2023   HDL 47.70 06/10/2023   LDLCALC 66 06/10/2023   TRIG 107.0 06/10/2023   CHOLHDL 3 06/10/2023   Medications Reviewed Today     Reviewed by Lenna Gilford, RPH (Pharmacist) on 06/18/23 at [id number]  Med List  Status: <None>   Medication Order Taking? Sig Documenting Provider Last Dose Status Informant  amLODipine (NORVASC) 10 MG tablet [id number] Yes Take 1 tablet (10 mg total) by mouth daily. Etta Grandchild, MD Taking Active   Ascorbic Acid (VITAMIN C PO) [id number]  Take 1 tablet by mouth daily. [provider]  Active Self  Cholecalciferol (VITAMIN D PO) [id number]  Take 1 tablet by mouth daily. [provider]  Active Self  levothyroxine (SYNTHROID) 75 MCG tablet [id number] No Take 1 tablet (75 mcg total) by mouth daily.  Patient not taking: Reported on 06/18/2023   Etta Grandchild, MD Not Taking Active            Med Note Littie Deeds, [place] A   Fri Jun 18, 2023  8:37 AM) Has not picked up yet  losartan (COZAAR) 50 MG tablet [id number] Yes TAKE ONE TABLET EACH DAY Etta Grandchild, MD Taking Active   potassium chloride (KLOR-CON) 10 MEQ tablet [id number] Yes TAKE ONE TABLET DAILY Etta Grandchild, MD Taking Active   rosuvastatin (CRESTOR) 10 MG tablet [id number] Yes TAKE ONE TABLET DAILY Etta Grandchild, MD Taking Active            Assessment/Plan:   Medication Access/Adherence -Informed son that based on recent TSH level, levothyroxine was increased to daily; and this prescription should be ready for pick up at [facility].  He plans to pick up today, and she will start this dose tomorrow.  Hypertension: - Currently controlled - Continue current regimen and regular follow-up with PCP  Hyperlipidemia/ASCVD Risk Reduction: - Currently controlled.  - Continue current regimen and  regular follow-up with PCP  Lenna Gilford, PharmD, DPLA

## 2023-07-06 ENCOUNTER — Other Ambulatory Visit: Payer: Self-pay | Admitting: Internal Medicine

## 2023-07-06 DIAGNOSIS — I10 Essential (primary) hypertension: Secondary | ICD-10-CM

## 2023-07-14 ENCOUNTER — Other Ambulatory Visit: Payer: Self-pay | Admitting: Internal Medicine

## 2023-07-14 DIAGNOSIS — E118 Type 2 diabetes mellitus with unspecified complications: Secondary | ICD-10-CM

## 2023-07-14 DIAGNOSIS — I10 Essential (primary) hypertension: Secondary | ICD-10-CM

## 2023-08-17 ENCOUNTER — Ambulatory Visit (INDEPENDENT_AMBULATORY_CARE_PROVIDER_SITE_OTHER): Payer: Medicare Other | Admitting: Internal Medicine

## 2023-08-17 ENCOUNTER — Encounter: Payer: Self-pay | Admitting: Internal Medicine

## 2023-08-17 VITALS — BP 118/68 | HR 77 | Temp 98.0°F | Ht 62.0 in | Wt 116.0 lb

## 2023-08-17 DIAGNOSIS — L03031 Cellulitis of right toe: Secondary | ICD-10-CM | POA: Diagnosis not present

## 2023-08-17 DIAGNOSIS — L600 Ingrowing nail: Secondary | ICD-10-CM

## 2023-08-17 MED ORDER — CEPHALEXIN 500 MG PO CAPS: 500.0000 mg | ORAL_CAPSULE | Freq: Three times a day (TID) | ORAL | 0 refills | Status: AC

## 2023-08-17 NOTE — Progress Notes (Signed)
Subjective:    Patient ID: Jackie Horton, female    DOB: 09/20/1933, 89 y.o.   MRN: [id number]      HPI Jackie Horton is here for  Chief Complaint  Patient presents with   Toe Pain    Right big toe swelling    Symptoms started one month ago.  No injuries.  The end of the right big toe is painful, swollen and red.  It has gotten worse over the past month.  Most of the pain is towards the tip of the toe by the nail.  She is not taking anything for the pain.  She denies any fevers or chills.  The pain is worse when she stands up to walk.      Medications and allergies reviewed with patient and updated if appropriate.  Current Outpatient Medications on File Prior to Visit  Medication Sig Dispense Refill   amLODipine (NORVASC) 10 MG tablet TAKE ONE TABLET DAILY 90 tablet 1   Ascorbic Acid (VITAMIN C PO) Take 1 tablet by mouth daily.     Cholecalciferol (VITAMIN D PO) Take 1 tablet by mouth daily.     levothyroxine (SYNTHROID) 75 MCG tablet Take 1 tablet (75 mcg total) by mouth daily. 90 tablet 3   losartan (COZAAR) 50 MG tablet TAKE ONE TABLET EACH DAY 90 tablet 1   potassium chloride (KLOR-CON) 10 MEQ tablet TAKE ONE TABLET DAILY 90 tablet 1   rosuvastatin (CRESTOR) 10 MG tablet TAKE ONE TABLET DAILY 90 tablet 1   No current facility-administered medications on file prior to visit.    Review of Systems     Objective:   Vitals:   08/17/23 0755  BP: 118/68  Pulse: 77  Temp: 98 F (36.7 C)  SpO2: 100%   BP Readings from Last 3 Encounters:  08/17/23 118/68  06/10/23 128/76  12/24/22 128/78   Wt Readings from Last 3 Encounters:  08/17/23 116 lb (52.6 kg)  06/10/23 119 lb (54 kg)  12/24/22 117 lb (53.1 kg)   Body mass index is 21.22 kg/m.    Physical Exam Constitutional:      General: She is not in acute distress.    Appearance: Normal appearance. She is not ill-appearing.  HENT:     Head: Normocephalic and atraumatic.  Skin:    General: Skin is warm  and dry.     Findings: Erythema (Right toe erythematous from tip of toe to base of toe at MTP joint.  Mild swelling.  Most tenderness at tip of toe near the nail and medial aspect of distal toenail) present.  Neurological:     Mental Status: She is alert.            Assessment & Plan:    Paronychia right large toe, possible ingrown toenail right great toe, cellulitis: Acute 1 month of redness, swelling and pain of right first toe-looks like it may have started as bronchia from an ingrown nail and cellulitis has developed that has extended to the base of the toe resulting in redness, swelling and pain Start Keflex 500 mg 3 times daily x 10 days Referral to podiatry-they can help follow-up on the infection and evaluate if there is an ingrown nail Tylenol as needed for pain Call if symptoms are not improving/worsening

## 2023-08-17 NOTE — Patient Instructions (Addendum)
       Medications changes include :   keflex three times a day for the infection    A referral was ordered podiatry and someone will call you to schedule an appointment.     Return if symptoms worsen or fail to improve.    [facility] at [facility] [address] [place],  [place]  [address] Main: [phone]

## 2023-08-18 ENCOUNTER — Other Ambulatory Visit: Payer: Self-pay | Admitting: Internal Medicine

## 2023-08-18 DIAGNOSIS — E7800 Pure hypercholesterolemia, unspecified: Secondary | ICD-10-CM

## 2023-08-25 ENCOUNTER — Ambulatory Visit: Payer: Medicare Other | Admitting: Podiatry

## 2023-08-25 ENCOUNTER — Encounter: Payer: Self-pay | Admitting: Podiatry

## 2023-08-25 VITALS — Ht 62.0 in | Wt 116.0 lb

## 2023-08-25 DIAGNOSIS — L600 Ingrowing nail: Secondary | ICD-10-CM

## 2023-08-25 NOTE — Progress Notes (Signed)
Subjective:  Patient ID: Jackie Horton, female    DOB: October 21, 1933,   MRN: [id number]  Chief Complaint  Patient presents with   Nail Problem    Pt believes she has a ingrown on right greater toe.    89 y.o. female presents for concern of ingrown right great toenail. She was seen by PCP and given cephalexin. Relates some continued soreness on the inside of the right great toe.  . Denies any other pedal complaints. Denies n/v/f/c.   Past Medical History:  Diagnosis Date   Breast cancer (HCC)    Cigarette smoker    Constipation    Hypercholesteremia    Hypertension    Hypothyroid    Osteoarthritis     Objective:  Physical Exam: Vascular: DP/PT pulses 2/4 bilateral. CFT <3 seconds. Normal hair growth on digits. No edema.  Skin. No lacerations or abrasions bilateral feet. Right hallux medial border slightly incurvated distally. No erythema edema or purulence noted.  Musculoskeletal: MMT 5/5 bilateral lower extremities in DF, PF, Inversion and Eversion. Deceased ROM in DF of ankle joint.  Neurological: Sensation intact to light touch.   Assessment:   1. Ingrown right greater toenail      Plan:  Patient was evaluated and treated and all questions answered. Discussed ingrown toenails etiology and treatment options including procedure for removal vs conservative care.  Patient requesting cosnervative care. Nail debrided back in slant back fashion to patient comfort. Advised on soaks and neosporin to the toe.  Finish out course of cephalexin Discussed if continued pain would consider procedure to remove nail.  Return as needed in future.    Louann Sjogren, DPM

## 2023-08-25 NOTE — Patient Instructions (Addendum)
Soak Instructions ° ° ° °THE DAY AFTER THE PROCEDURE ° °Place 1/4 cup of epsom salts (or betadine, or white vinegar) in a quart of warm tap water.  Submerge your foot or feet with outer bandage intact for the initial soak; this will allow the bandage to become moist and wet for easy lift off.  Once you remove your bandage, continue to soak in the solution for 20 minutes.  This soak should be done twice a day.  Next, remove your foot or feet from solution, blot dry the affected area and cover.  You may use a band aid large enough to cover the area or use gauze and tape.  Apply other medications to the area as directed by the doctor such as polysporin neosporin. ° °IF YOUR SKIN BECOMES IRRITATED WHILE USING THESE INSTRUCTIONS, IT IS OKAY TO SWITCH TO  WHITE VINEGAR AND WATER. Or you may use antibacterial soap and water to keep the toe clean ° °Monitor for any signs/symptoms of infection. Call the office immediately if any occur or go directly to the emergency room. Call with any questions/concerns. ° ° °

## 2023-12-13 ENCOUNTER — Encounter: Payer: Self-pay | Admitting: Internal Medicine

## 2023-12-13 ENCOUNTER — Ambulatory Visit (INDEPENDENT_AMBULATORY_CARE_PROVIDER_SITE_OTHER): Payer: Medicare Other | Admitting: Internal Medicine

## 2023-12-13 VITALS — BP 104/78 | HR 90 | Temp 98.0°F | Resp 16 | Ht 62.0 in | Wt 112.0 lb

## 2023-12-13 DIAGNOSIS — Z0001 Encounter for general adult medical examination with abnormal findings: Secondary | ICD-10-CM | POA: Insufficient documentation

## 2023-12-13 DIAGNOSIS — I950 Idiopathic hypotension: Secondary | ICD-10-CM | POA: Diagnosis not present

## 2023-12-13 DIAGNOSIS — E118 Type 2 diabetes mellitus with unspecified complications: Secondary | ICD-10-CM

## 2023-12-13 DIAGNOSIS — Z0000 Encounter for general adult medical examination without abnormal findings: Secondary | ICD-10-CM | POA: Diagnosis not present

## 2023-12-13 DIAGNOSIS — E039 Hypothyroidism, unspecified: Secondary | ICD-10-CM | POA: Diagnosis not present

## 2023-12-13 DIAGNOSIS — N184 Chronic kidney disease, stage 4 (severe): Secondary | ICD-10-CM

## 2023-12-13 LAB — HEMOGLOBIN A1C: Hgb A1c MFr Bld: 6.2 % (ref 4.6–6.5)

## 2023-12-13 LAB — HEPATIC FUNCTION PANEL
ALT: 32 U/L (ref 0–35)
AST: 29 U/L (ref 0–37)
Albumin: 4.7 g/dL (ref 3.5–5.2)
Alkaline Phosphatase: 109 U/L (ref 39–117)
Bilirubin, Direct: 0.1 mg/dL (ref 0.0–0.3)
Total Bilirubin: 0.7 mg/dL (ref 0.2–1.2)
Total Protein: 8.3 g/dL (ref 6.0–8.3)

## 2023-12-13 LAB — BASIC METABOLIC PANEL
BUN: 17 mg/dL (ref 6–23)
CO2: 22 meq/L (ref 19–32)
Calcium: 10.4 mg/dL (ref 8.4–10.5)
Chloride: 107 meq/L (ref 96–112)
Creatinine, Ser: 0.99 mg/dL (ref 0.40–1.20)
GFR: 50.94 mL/min — ABNORMAL LOW (ref >60.00)
Glucose, Bld: 111 mg/dL — ABNORMAL HIGH (ref 70–99)
Potassium: 4.1 meq/L (ref 3.5–5.1)
Sodium: 139 meq/L (ref 135–145)

## 2023-12-13 LAB — CBC WITH DIFFERENTIAL/PLATELET
Basophils Absolute: 0 10*3/uL (ref 0.0–0.1)
Basophils Relative: 0.6 % (ref 0.0–3.0)
Eosinophils Absolute: 0 10*3/uL (ref 0.0–0.7)
Eosinophils Relative: 0.4 % (ref 0.0–5.0)
HCT: 44.8 % (ref 36.0–46.0)
Hemoglobin: 15 g/dL (ref 12.0–15.0)
Lymphocytes Relative: 37.8 % (ref 12.0–46.0)
Lymphs Abs: 2.9 10*3/uL (ref 0.7–4.0)
MCHC: 33.5 g/dL (ref 30.0–36.0)
MCV: 90.3 fL (ref 78.0–100.0)
Monocytes Absolute: 0.5 10*3/uL (ref 0.1–1.0)
Monocytes Relative: 6.1 % (ref 3.0–12.0)
Neutro Abs: 4.3 10*3/uL (ref 1.4–7.7)
Neutrophils Relative %: 55.1 % (ref 43.0–77.0)
Platelets: 276 10*3/uL (ref 150.0–400.0)
RBC: 4.97 Mil/uL (ref 3.87–5.11)
RDW: 14.4 % (ref 11.5–15.5)
WBC: 7.8 10*3/uL (ref 4.0–10.5)

## 2023-12-13 LAB — VITAMIN D 25 HYDROXY (VIT D DEFICIENCY, FRACTURES): VITD: 41.06 ng/mL (ref 30.00–100.00)

## 2023-12-13 LAB — TSH: TSH: 0.31 u[IU]/mL — ABNORMAL LOW (ref 0.35–5.50)

## 2023-12-13 LAB — CORTISOL: Cortisol, Plasma: 13.7 ug/dL

## 2023-12-13 LAB — PHOSPHORUS: Phosphorus: 2.9 mg/dL (ref 2.3–4.6)

## 2023-12-13 NOTE — Progress Notes (Unsigned)
 Subjective:  Patient ID: Jackie Horton, female    DOB: [birth year]  Age: 89 y.o. MRN: [id number]  CC: Hypertension, Hypothyroidism, Hyperlipidemia, and Diabetes   HPI [name] presents for f/up ----  Discussed the use of AI scribe software for clinical note transcription with the patient, who gave verbal consent to proceed.  History of Present Illness   Jackie Horton is an 89 year old female who presents with medication adherence issues and weight loss. She is accompanied by her son.  She is experiencing issues with medication adherence, particularly with her potassium supplement. Her son has found the large yellow pills left in a bowl on the kitchen table, indicating she is not taking them as prescribed. He ensures that all her medications are set out daily, and all are taken except for the potassium supplement.  She has experienced a weight loss of four pounds since her last visit, now weighing 112 pounds compared to 116 pounds previously. Her son notes that she does not have a large appetite, although there is plenty of food available. Her diet consists of a regular breakfast of bananas, [place], and milk, and she consumes about half a gallon of milk weekly. She also drinks coffee and Cokes and smokes cigarettes, a habit she has had for approximately fifty years. Despite the weight loss, she is still able to cook meals, particularly on Sundays, but her overall appetite remains low.  No chest pain, shortness of breath, or coughing. She reports no issues with breathing and states 'nothing's hurt me'.       Outpatient Medications Prior to Visit  Medication Sig Dispense Refill   Ascorbic Acid (VITAMIN C PO) Take 1 tablet by mouth daily.     Cholecalciferol (VITAMIN D PO) Take 1 tablet by mouth daily.     potassium chloride (KLOR-CON) 10 MEQ tablet TAKE ONE TABLET DAILY 90 tablet 1   rosuvastatin (CRESTOR) 10 MG tablet TAKE ONE TABLET DAILY 90 tablet 1   amLODipine  (NORVASC) 10 MG tablet TAKE ONE TABLET DAILY 90 tablet 1   levothyroxine (SYNTHROID) 75 MCG tablet Take 1 tablet (75 mcg total) by mouth daily. 90 tablet 3   losartan (COZAAR) 50 MG tablet TAKE ONE TABLET EACH DAY 90 tablet 1   No facility-administered medications prior to visit.    ROS Review of Systems  Constitutional:  Positive for unexpected weight change (wt loss). Negative for appetite change, chills, diaphoresis and fatigue.  HENT: Negative.    Eyes: Negative.   Respiratory: Negative.  Negative for cough, chest tightness, shortness of breath and wheezing.   Cardiovascular:  Negative for chest pain, palpitations and leg swelling.  Gastrointestinal:  Negative for abdominal pain, constipation, diarrhea, nausea and vomiting.  Genitourinary: Negative.  Negative for difficulty urinating, dysuria and hematuria.  Musculoskeletal:  Positive for gait problem. Negative for arthralgias, myalgias and neck pain.  Skin: Negative.  Negative for color change and pallor.  Neurological:  Positive for weakness. Negative for dizziness, numbness and headaches.  Psychiatric/Behavioral:  Positive for confusion and decreased concentration.     Objective:  BP 104/78 (BP Location: Left Arm, Patient Position: Sitting, Cuff Size: Normal)   Pulse 90   Temp 98 F (36.7 C) (Oral)   Resp 16   Ht 5\' 2"  (1.575 m)   Wt 112 lb (50.8 kg)   SpO2 93%   BMI 20.49 kg/m   BP Readings from Last 3 Encounters:  12/13/23 104/78  08/17/23 118/68  06/10/23 128/76  Wt Readings from Last 3 Encounters:  12/13/23 112 lb (50.8 kg)  08/25/23 116 lb (52.6 kg)  08/17/23 116 lb (52.6 kg)    Physical Exam Vitals reviewed.  Constitutional:      General: She is not in acute distress.    Appearance: She is ill-appearing. She is not toxic-appearing or diaphoretic.  HENT:     Mouth/Throat:     Mouth: Mucous membranes are moist.  Eyes:     General: No scleral icterus.    Conjunctiva/sclera: Conjunctivae normal.   Cardiovascular:     Rate and Rhythm: Normal rate and regular rhythm.     Heart sounds: Normal heart sounds, S1 normal and S2 normal. No murmur heard.    No friction rub. No gallop.     Comments: EKG---  NSR, 89 bpm ++++artifact ?LAE No LVH, Q waves, or ST/T wave changes  Pulmonary:     Effort: Pulmonary effort is normal.     Breath sounds: No stridor. No wheezing, rhonchi or rales.  Abdominal:     General: Abdomen is flat.     Palpations: There is no mass.     Tenderness: There is no abdominal tenderness. There is no guarding.     Hernia: No hernia is present.  Musculoskeletal:     Right lower leg: No edema.     Left lower leg: No edema.  Skin:    General: Skin is warm and dry.  Neurological:     Mental Status: She is alert. Mental status is at baseline.     Motor: Weakness present.     Coordination: Coordination abnormal.     Gait: Gait abnormal.  Psychiatric:        Mood and Affect: Mood normal.        Behavior: Behavior normal.     Lab Results  Component Value Date   WBC 7.8 12/13/2023   HGB 15.0 12/13/2023   HCT 44.8 12/13/2023   PLT 276.0 12/13/2023   GLUCOSE 111 (H) 12/13/2023   CHOL 135 06/10/2023   TRIG 107.0 06/10/2023   HDL 47.70 06/10/2023   LDLCALC 66 06/10/2023   ALT 32 12/13/2023   AST 29 12/13/2023   NA 139 12/13/2023   K 4.1 12/13/2023   CL 107 12/13/2023   CREATININE 0.99 12/13/2023   BUN 17 12/13/2023   CO2 22 12/13/2023   TSH 0.31 (L) 12/13/2023   INR 1.0 05/16/2009   HGBA1C 6.2 12/13/2023   MICROALBUR 4.7 (H) 06/16/2022    MR Brain Wo Contrast Result Date: 12/20/2018 CLINICAL DATA:  Falling over the last week.  Confusion and weakness. EXAM: MRI HEAD WITHOUT CONTRAST TECHNIQUE: Multiplanar, multiecho pulse sequences of the brain and surrounding structures were obtained without intravenous contrast. COMPARISON:  07/11/2013.  01/13/2012. FINDINGS: Brain: Diffusion imaging does not show any acute or subacute infarction. There chronic  small-vessel ischemic changes of the pons. No focal cerebellar insult. Cerebral hemispheres show old small vessel infarctions in the left lateral thalamus and left basal ganglia. There is an old right posterior frontal cortical and subcortical infarction. There are mild chronic small-vessel ischemic changes of the deep white matter. There is some hemosiderin deposition in the region of the old infarctions in the left thalamus and basal ganglia. There could be a developmental venous anomaly in that region. No sign of mass, acute hemorrhage, hydrocephalus or extra-axial collection. Vascular: Major vessels at the base of the brain show flow. Skull and upper cervical spine: Chronic arthropathy at the C1-2 articulation but  without encroachment upon the spinal canal. Sinuses/Orbits: Clear/normal Other: None IMPRESSION: No acute or reversible finding. Chronic small-vessel ischemic changes of the pons and cerebral hemispheric white matter. 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 I wonder if there is a developmental venous anomaly in this region as well. If so, this is obviously lifelong and would not be specifically treatable in any way. Electronically Signed   By: Paulina Fusi M.D.   On: 12/20/2018 08:01    Assessment & Plan:   Idiopathic hypotension- Will discontinue the antihypertensives. EKG is okay. -     Hepatic function panel; Future -     CBC with Differential/Platelet; Future -     Cortisol; Future -     EKG 12-Lead -     AMB Referral VBCI Care Management  Hypercalcemia- Normal now. -     Basic metabolic panel; Future -     Phosphorus; Future -     VITAMIN D 25 Hydroxy (Vit-D Deficiency, Fractures); Future -     PTH, intact and calcium; Future  Type II diabetes mellitus with manifestations (HCC) -     Hemoglobin A1c; Future -     Basic metabolic panel; Future  Chronic renal disease, stage 4, severely decreased glomerular filtration  rate (GFR) between 15-29 mL/min/1.73 square meter (HCC)- Renal function is stable. -     Hepatic function panel; Future -     CBC with Differential/Platelet; Future -     Basic metabolic panel; Future -     Phosphorus; Future -     VITAMIN D 25 Hydroxy (Vit-D Deficiency, Fractures); Future -     PTH, intact and calcium; Future -     AMB Referral VBCI Care Management  Acquired hypothyroidism- T4 dose is too high. -     TSH; Future -     AMB Referral VBCI Care Management -     Levothyroxine Sodium; Take 1 tablet (50 mcg total) by mouth daily.  Dispense: 90 tablet; Refill: 1  Encounter for general adult medical examination with abnormal findings- Exam completed, labs reviewed, vaccines reviewed, no cancer screenings indicated, pt ed material was given.      Follow-up: Return in about 4 months (around 04/11/2024).  Sanda Linger, MD

## 2023-12-13 NOTE — Patient Instructions (Signed)

## 2023-12-14 MED ORDER — LEVOTHYROXINE SODIUM 50 MCG PO TABS
50.0000 ug | ORAL_TABLET | Freq: Every day | ORAL | 1 refills | Status: DC
Start: 2023-12-14 — End: 2024-01-06

## 2023-12-15 LAB — PTH, INTACT AND CALCIUM
Calcium: 8.4 mg/dL — ABNORMAL LOW (ref 8.6–10.4)
PTH: 59 pg/mL (ref 16–77)

## 2023-12-19 ENCOUNTER — Encounter: Payer: Self-pay | Admitting: Internal Medicine

## 2023-12-21 ENCOUNTER — Telehealth: Payer: Self-pay

## 2023-12-21 NOTE — Progress Notes (Signed)
 Care Guide Pharmacy Note  12/21/2023 Name: Jackie Horton MRN: [id number] DOB: 1933/10/19  Referred By: Etta Grandchild, MD Reason for referral: Care Coordination (Outreach to schedule with Pharm d )   Jackie Horton is a 89 y.o. year old female who is a primary care patient of Etta Grandchild, MD.  Daryel Gerald was referred to the pharmacist for assistance related to: HTN and DMII  Successful contact was made with the patient to discuss pharmacy services including being ready for the pharmacist to call at least 5 minutes before the scheduled appointment time and to have medication bottles and any blood pressure readings ready for review. The patient agreed to meet with the pharmacist via telephone visit on (date/time).01/06/2024  Penne Lash , RMA     [place]  [facility], [facility] Guide  Direct Dial: [phone]  Website: [email or web address]

## 2024-01-06 ENCOUNTER — Other Ambulatory Visit: Payer: Self-pay | Admitting: Internal Medicine

## 2024-01-06 ENCOUNTER — Other Ambulatory Visit: Payer: Medicare Other

## 2024-01-06 DIAGNOSIS — E039 Hypothyroidism, unspecified: Secondary | ICD-10-CM

## 2024-01-06 MED ORDER — LEVOTHYROXINE SODIUM 50 MCG PO TABS: 50.0000 ug | ORAL_TABLET | Freq: Every day | ORAL | 1 refills | Status: DC

## 2024-01-06 NOTE — Telephone Encounter (Signed)
 Copied from CRM [id number]. Topic: Clinical - Prescription Issue >> Jan 06, 2024  1:22 PM Denese Killings wrote: Reason for CRM: Patient son stated that doctor was suppose to send over prescription for levothyroxine (SYNTHROID) 50 MCG tablet. [facility] still doesn't have prescription. Please patient son back.  [facility] called and spoke to [place], [facility] about the refill(s) levothyroxine 50 MCG requested. Advised it was sent on 12/14/23 #90/1 refill(s). He states the rx was not received. Reviewed chart and figured out why wasn't sent. Will send back to practice to can be resent.    Disp Refills Start End   levothyroxine (SYNTHROID) 50 MCG tablet 90 tablet 1 12/14/2023 --   Sig - Route: Take 1 tablet (50 mcg total) by mouth daily. - Oral   Sent to pharmacy as: levothyroxine (SYNTHROID) 50 MCG tablet   E-Prescribing Status: Transmission to pharmacy failed (12/14/2023  8:41 AM EST)   Message completed by Daryll Brod, CMA (12/14/2023 8:44 AM).

## 2024-01-06 NOTE — Progress Notes (Unsigned)
   01/07/2024 Name: Jackie Horton MRN: [id number] DOB: [birth year]  Chief Complaint  Patient presents with   Hypothyroidism   Medication Management   Hypotension    Jackie Horton is a 89 y.o. year old female who presented for a telephone visit.   They were referred to the pharmacist by their PCP for assistance in managing  hypotension, hypothyroidism .     Subjective:  Care Team: Primary Care Provider: Etta Grandchild, MD ; Next Scheduled Visit: 04/17/24   Medication Access/Adherence  Current Pharmacy:  [facility] - [place], [place] - [address] [place] [address] Phone: [phone] Fax: [phone]   Patient reports affordability concerns with their medications: No  Patient reports access/transportation concerns to their pharmacy: No  Patient reports adherence concerns with their medications:  No     Hypotension:  Current medications: none   Medications previously tried: amlodipine and losartan d/c 2/17 due to hypotension  Patient does not have a validated, automated, upper arm home BP cuff Current blood pressure readings: none   Patient denies hypotensive s/sx including dizziness, lightheadedness.  Patient  hypertensive symptoms including headache, chest pain, shortness of breath  Pt notes she feels good, balance and energy seem to have improved  Hypothyroidism: Current medications: levothyroxine 75 mcg daily - pt's son was unaware of decreased levothyroxine dose   Objective:  Lab Results  Component Value Date   HGBA1C 6.2 12/13/2023    Lab Results  Component Value Date   CREATININE 0.99 12/13/2023   BUN 17 12/13/2023   NA 139 12/13/2023   K 4.1 12/13/2023   CL 107 12/13/2023   CO2 22 12/13/2023    Lab Results  Component Value Date   CHOL 135 06/10/2023   HDL 47.70 06/10/2023   LDLCALC 66 06/10/2023   TRIG 107.0 06/10/2023   CHOLHDL 3 06/10/2023    Medications Reviewed Today   Medications  were not reviewed in this encounter       Assessment/Plan:   Hypertension/hypotension: - Currently controlled, BP goal <140/90 without hypotension. Pt feels better, no readings to assess control off of medication - Recommended to check home blood pressure and heart rate daily for a couple weeks to see if her Bps have remained below goal - Recommend to continue without BP medications - Consider updating BMP in 4-6 weeks to assess if potassium chloride is still needed    Hypothyroidism: Informed pt's son of decreased levothyroxine dose. He states he will go get the new dose from the pharmacy today.  - consider TSH recheck in 4-6 weeks   Follow Up Plan: 3 weeks  Arbutus Leas, PharmD, BCPS, CPP Clinical Pharmacist Practitioner [facility] [phone]

## 2024-01-07 NOTE — Patient Instructions (Signed)
 It was a pleasure speaking with you today!  Pick up reduced dose of levothyroxine 50 mcg daily - this will replace levothyroxine 75 mcg daily.  Recommend getting an arm blood pressure monitor to see how her blood pressures are doing off of medication. I will call in a few weeks to see how they are doing.  Feel free to call with any questions or concerns!  Arbutus Leas, PharmD, BCPS, CPP Clinical Pharmacist Practitioner [facility] [phone]

## 2024-01-28 ENCOUNTER — Other Ambulatory Visit (INDEPENDENT_AMBULATORY_CARE_PROVIDER_SITE_OTHER)

## 2024-01-28 DIAGNOSIS — I950 Idiopathic hypotension: Secondary | ICD-10-CM

## 2024-01-28 NOTE — Progress Notes (Signed)
   01/28/2024 Name: Jackie Horton MRN: [id number] DOB: 1933-05-17  Chief Complaint  Patient presents with   Hypertension   Medication Management    KANIKA MALLO is a 89 y.o. year old female who presented for a telephone visit.   They were referred to the pharmacist by their PCP for assistance in managing  hypotension, hypothyroidism .     Subjective:  Care Team: Primary Care Provider: Arcadio Knuckles, MD ; Next Scheduled Visit: 04/17/24   Medication Access/Adherence  Current Pharmacy:  [facility] - [place], [place] - [address] [place] [address] Phone: [phone] Fax: [phone]   Patient reports affordability concerns with their medications: No  Patient reports access/transportation concerns to their pharmacy: No  Patient reports adherence concerns with their medications:  No     Hypotension:  Current medications: none   Medications previously tried: amlodipine  and losartan  d/c 2/17 due to hypotension  Patient does not have a validated, automated, upper arm home BP cuff Current blood pressure readings: pt's son notes he has checked but was unable to specify readings and felt that there might be a machine error. Son wanted to schedule an OV for BP check but was unable to at the time of call without his calendar.  Patient denies hypotensive s/sx including dizziness, lightheadedness.  Patient  hypertensive symptoms including headache, chest pain, shortness of breath   Hypothyroidism: Current medications: levothyroxine  50 mcg daily    Objective:  Lab Results  Component Value Date   HGBA1C 6.2 12/13/2023    Lab Results  Component Value Date   CREATININE 0.99 12/13/2023   BUN 17 12/13/2023   NA 139 12/13/2023   K 4.1 12/13/2023   CL 107 12/13/2023   CO2 22 12/13/2023    Lab Results  Component Value Date   CHOL 135 06/10/2023   HDL 47.70 06/10/2023   LDLCALC 66 06/10/2023   TRIG 107.0 06/10/2023    CHOLHDL 3 06/10/2023    Medications Reviewed Today   Medications were not reviewed in this encounter       Assessment/Plan:   Hypertension/hypotension: - Currently controlled, BP goal <140/90 without hypotension. Pt feels better, no readings to assess control off of medication - Recommended to check home blood pressure and heart rate daily for a couple weeks to see if her Bps have remained below goal - Recommend to continue without BP medications - Consider updating BMP in 4-6 weeks to assess if potassium chloride  is still needed   **Attempted to call son back to schedule BP check however was unable to reach   Hypothyroidism: - consider TSH recheck in 4-6 weeks   Follow Up Plan: PCP f/u June  Rainelle Bur, PharmD, BCPS, CPP Clinical Pharmacist Practitioner [facility] [phone]

## 2024-02-17 ENCOUNTER — Telehealth: Payer: Self-pay | Admitting: Internal Medicine

## 2024-02-17 DIAGNOSIS — E7800 Pure hypercholesterolemia, unspecified: Secondary | ICD-10-CM

## 2024-02-17 DIAGNOSIS — I10 Essential (primary) hypertension: Secondary | ICD-10-CM

## 2024-02-22 ENCOUNTER — Ambulatory Visit: Payer: Self-pay

## 2024-02-22 NOTE — Telephone Encounter (Signed)
 This Clinical research associate review chart and unable to find reason refills for potassium and rosuvastatin  still pending, called to [facility] and explained request was submitted on 02/17/24, pharmacy gave 5 day courtesy refill and will be out of medication tomorrow, this writer was advised the request will be reviewed but cannot guarantee it will be sent by end of day 02/23/24. This Clinical research associate called and spoke son Jackie Horton ([place]), let him know that clinic has been made aware she will be out of medication 02/23/24, Jackie Horton will send a message if medication are not at pharmacy on 02/23/24.   Pending refill requests from 02/17/24.    Copied from CRM [id number]. Topic: Clinical - Prescription Issue >> Feb 22, 2024  3:33 PM [name] wrote: Reason for CRM: Patient daughter in law is call to check the status of RX request that was submitted on 02/17/24 for Potassium Chloride  (10 mEq Oral Daily), Rosuvastatin  Calcium  (10 mg Oral Daily). Pharmacy provided patient with a 5 day supply and patient will be completely out of medication  tomorrow(02/23/24). Please contact Jackie Horton(Daughter In [place]) at [phone] for RX update. Reason for Disposition . [1] Prescription refill request for NON-ESSENTIAL medicine (i.e., no harm to patient if med not taken) AND [2] triager unable to refill per department policy  Protocols used: Medication Refill and Renewal Call-A-AH

## 2024-02-22 NOTE — Telephone Encounter (Signed)
 Pt called in for a status update on refills

## 2024-02-24 ENCOUNTER — Other Ambulatory Visit: Payer: Self-pay | Admitting: Internal Medicine

## 2024-02-24 DIAGNOSIS — I10 Essential (primary) hypertension: Secondary | ICD-10-CM

## 2024-02-24 DIAGNOSIS — E7800 Pure hypercholesterolemia, unspecified: Secondary | ICD-10-CM

## 2024-02-24 NOTE — Telephone Encounter (Signed)
 Copied from CRM [id number]. Topic: Clinical - Medication Refill >> Feb 24, 2024  2:55 PM [place] wrote: Most Recent Primary Care Visit:  Provider: [facility]  Department: [facility]  Visit Type: PATIENT OUTREACH 30  Date: 01/28/2024  Medication: potassium chloride  (KLOR-CON ) 10 MEQ tablet ; rosuvastatin  (CRESTOR ) 10 MG tablet  Has the patient contacted their pharmacy? Yes (Agent: If no, request that the patient contact the pharmacy for the refill. If patient does not wish to contact the pharmacy document the reason why and proceed with request.) (Agent: If yes, when and what did the pharmacy advise?)  Is this the correct pharmacy for this prescription? Yes If no, delete pharmacy and type the correct one.  This is the patient's preferred pharmacy:  [facility] - [place], [place] - [address] [place] [address] Phone: [phone] Fax: [phone]   Has the prescription been filled recently? Yes  Is the patient out of the medication? Yes  Has the patient been seen for an appointment in the last year OR does the patient have an upcoming appointment? Yes  Can we respond through MyChart? Yes  Agent: Please be advised that Rx refills may take up to 3 business days. We ask that you follow-up with your pharmacy.

## 2024-02-24 NOTE — Telephone Encounter (Signed)
 Copied from CRM [id number]. Topic: Clinical - Prescription Issue >> Feb 24, 2024  2:57 PM [place] wrote: Reason for CRM: Patient Daughter calling to get an update on a medication refill. Her mother in law is out of the medications that she needs  [phone] please call her as soon as possible.

## 2024-02-25 NOTE — Telephone Encounter (Signed)
 Patient son and patient have called many time requesting refill. Patient refill has not been addressed by provider. Patient has appointment scheduled June 23rd. Patient medications sent to preferred pharmacy, 90 supply with 1 refill.

## 2024-02-25 NOTE — Telephone Encounter (Signed)
 Medication has been refilled. This was handled by Burdette Carolin.

## 2024-02-25 NOTE — Telephone Encounter (Signed)
 Copied from CRM [id number]. Topic: Clinical - Prescription Issue >> Feb 24, 2024  2:57 PM [place] wrote: Reason for CRM: Patient Daughter calling to get an update on a medication refill. Her mother in law is out of the medications that she needs  [phone] please call her as soon as possible. >> Feb 25, 2024  8:34 AM Ovid Blow wrote: Patient's son Daphne Eagles called checking the status of patient's medication potassium chloride  (KLOR-CON ) 10 MEQ tablet and  rosuvastatin  (CRESTOR ) 10 MG tablet. Patient is completely out of her medication. Dr. Rochelle Chu took patient off of BP medication. Son is concerned about the next move on these medication and issues. Please call son @ [phone]

## 2024-04-17 ENCOUNTER — Encounter: Payer: Self-pay | Admitting: Internal Medicine

## 2024-04-17 ENCOUNTER — Ambulatory Visit (INDEPENDENT_AMBULATORY_CARE_PROVIDER_SITE_OTHER): Payer: Medicare Other | Admitting: Internal Medicine

## 2024-04-17 ENCOUNTER — Ambulatory Visit (INDEPENDENT_AMBULATORY_CARE_PROVIDER_SITE_OTHER)

## 2024-04-17 VITALS — BP 130/80 | HR 96 | Ht 63.5 in | Wt 110.0 lb

## 2024-04-17 VITALS — BP 126/88 | HR 90 | Temp 98.0°F | Resp 20 | Ht 63.5 in | Wt 107.0 lb

## 2024-04-17 DIAGNOSIS — E118 Type 2 diabetes mellitus with unspecified complications: Secondary | ICD-10-CM

## 2024-04-17 DIAGNOSIS — Z0000 Encounter for general adult medical examination without abnormal findings: Secondary | ICD-10-CM | POA: Diagnosis not present

## 2024-04-17 DIAGNOSIS — L602 Onychogryphosis: Secondary | ICD-10-CM | POA: Diagnosis not present

## 2024-04-17 DIAGNOSIS — E039 Hypothyroidism, unspecified: Secondary | ICD-10-CM | POA: Diagnosis not present

## 2024-04-17 LAB — BASIC METABOLIC PANEL WITH GFR
BUN: 11 mg/dL (ref 6–23)
CO2: 24 meq/L (ref 19–32)
Calcium: 10.8 mg/dL — ABNORMAL HIGH (ref 8.4–10.5)
Chloride: 106 meq/L (ref 96–112)
Creatinine, Ser: 0.9 mg/dL (ref 0.40–1.20)
GFR: 56.97 mL/min — ABNORMAL LOW (ref >60.00)
Glucose, Bld: 96 mg/dL (ref 70–99)
Potassium: 3.9 meq/L (ref 3.5–5.1)
Sodium: 141 meq/L (ref 135–145)

## 2024-04-17 LAB — TSH: TSH: 0.8 u[IU]/mL (ref 0.35–5.50)

## 2024-04-17 LAB — HEMOGLOBIN A1C: Hgb A1c MFr Bld: 6.2 % (ref 4.6–6.5)

## 2024-04-17 NOTE — Patient Instructions (Signed)

## 2024-04-17 NOTE — Progress Notes (Unsigned)
 Subjective:  Patient ID: Jackie Horton, female    DOB: Jun 14, 1933  Age: 89 y.o. MRN: [id number]  CC: Hypertension, Hypothyroidism, and Diabetes   HPI Jackie Horton presents for f/up ---  Discussed the use of AI scribe software for clinical note transcription with the patient, who gave verbal consent to proceed.  History of Present Illness   Jackie Horton is an 89 year old female who presents with balance issues and weight loss.  She experiences balance issues, feeling wobbly when walking, which raises concerns about potential falls. This is particularly worrying for her caregiver, who is keen to prevent any accidents.  She has lost five pounds recently and has a decreased appetite. Her caregiver describes her as a 'choice eater' and mentions not being a 'great cook,' which may affect her dietary intake. Despite this, she drinks milk daily.  She has a history of smoking and continues to smoke. Additionally, she has hearing difficulties. Although she was evaluated by a hearing specialist, she does not wear a hearing aid.  No cough, nausea, vomiting, or difficulty breathing. No pain reported. Normal bowel movements.       Outpatient Medications Prior to Visit  Medication Sig Dispense Refill   Ascorbic Acid (VITAMIN C PO) Take 1 tablet by mouth daily.     Cholecalciferol (VITAMIN D  PO) Take 1 tablet by mouth daily.     potassium chloride  (KLOR-CON ) 10 MEQ tablet TAKE ONE TABLET DAILY 90 tablet 1   rosuvastatin  (CRESTOR ) 10 MG tablet TAKE ONE TABLET DAILY 90 tablet 1   levothyroxine  (SYNTHROID ) 50 MCG tablet Take 1 tablet (50 mcg total) by mouth daily. 90 tablet 1   No facility-administered medications prior to visit.    ROS Review of Systems  Constitutional:  Negative for appetite change, chills, diaphoresis, fatigue and fever.  HENT: Negative.    Eyes: Negative.   Respiratory:  Negative for cough, chest tightness, shortness of breath and wheezing.   Cardiovascular:   Negative for chest pain, palpitations and leg swelling.  Gastrointestinal: Negative.  Negative for abdominal pain, constipation, diarrhea, nausea and vomiting.  Endocrine: Negative.   Genitourinary: Negative.  Negative for difficulty urinating.  Musculoskeletal:  Positive for gait problem.  Neurological:  Negative for dizziness and weakness.  Hematological:  Negative for adenopathy. Does not bruise/bleed easily.  Psychiatric/Behavioral:  Positive for confusion and decreased concentration.     Objective:  BP 126/88   Pulse 90   Temp 98 F (36.7 C)   Resp 20   Ht 5' 3.5 (1.613 m)   Wt 107 lb (48.5 kg)   SpO2 95%   BMI 18.66 kg/m   BP Readings from Last 3 Encounters:  04/17/24 126/88  04/17/24 130/80  12/13/23 104/78    Wt Readings from Last 3 Encounters:  04/17/24 107 lb (48.5 kg)  04/17/24 110 lb (49.9 kg)  12/13/23 112 lb (50.8 kg)    Physical Exam Vitals reviewed.  HENT:     Mouth/Throat:     Mouth: Mucous membranes are moist.   Eyes:     General: No scleral icterus.    Conjunctiva/sclera: Conjunctivae normal.    Cardiovascular:     Rate and Rhythm: Normal rate and regular rhythm.     Heart sounds: No murmur heard.    No friction rub. No gallop.  Pulmonary:     Effort: Pulmonary effort is normal.     Breath sounds: No stridor. No wheezing, rhonchi or rales.  Abdominal:  General: Abdomen is flat.     Palpations: There is no mass.     Tenderness: There is no abdominal tenderness. There is no guarding.     Hernia: No hernia is present.   Musculoskeletal:        General: Normal range of motion.     Cervical back: Neck supple.     Right lower leg: No edema.     Left lower leg: No edema.  Lymphadenopathy:     Cervical: No cervical adenopathy.   Skin:    General: Skin is warm and dry.   Neurological:     Mental Status: She is alert. Mental status is at baseline.   Psychiatric:        Mood and Affect: Mood normal.        Behavior: Behavior  normal.     Lab Results  Component Value Date   WBC 7.8 12/13/2023   HGB 15.0 12/13/2023   HCT 44.8 12/13/2023   PLT 276.0 12/13/2023   GLUCOSE 96 04/17/2024   CHOL 135 06/10/2023   TRIG 107.0 06/10/2023   HDL 47.70 06/10/2023   LDLCALC 66 06/10/2023   ALT 32 12/13/2023   AST 29 12/13/2023   NA 141 04/17/2024   K 3.9 04/17/2024   CL 106 04/17/2024   CREATININE 0.90 04/17/2024   BUN 11 04/17/2024   CO2 24 04/17/2024   TSH 0.80 04/17/2024   INR 1.0 05/16/2009   HGBA1C 6.2 04/17/2024   MICROALBUR 4.7 (H) 06/16/2022    MR Brain Wo Contrast Result Date: 12/20/2018 CLINICAL DATA:  Falling over the last week.  Confusion and weakness. EXAM: MRI HEAD WITHOUT CONTRAST TECHNIQUE: Multiplanar, multiecho pulse sequences of the brain and surrounding structures were obtained without intravenous contrast. COMPARISON:  07/11/2013.  01/13/2012. FINDINGS: Brain: Diffusion imaging does not show any acute or subacute infarction. There chronic small-vessel ischemic changes of the pons. No focal cerebellar insult. Cerebral hemispheres show old small vessel infarctions in the left lateral thalamus and left basal ganglia. There is an old right posterior frontal cortical and subcortical infarction. There are mild chronic small-vessel ischemic changes of the deep white matter. There is some hemosiderin deposition in the region of the old infarctions in the left thalamus and basal ganglia. There could be a developmental venous anomaly in that region. No sign of mass, acute hemorrhage, hydrocephalus or extra-axial collection. Vascular: Major vessels at the base of the brain show flow. Skull and upper cervical spine: Chronic arthropathy at the C1-2 articulation but without encroachment upon the spinal canal. Sinuses/Orbits: Clear/normal Other: None IMPRESSION: No acute or reversible finding. Chronic small-vessel ischemic changes of the pons and cerebral hemispheric white matter. 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 I wonder if there is a developmental venous anomaly in this region as well. If so, this is obviously lifelong and would not be specifically treatable in any way. Electronically Signed   By: Oneil Officer M.D.   On: 12/20/2018 08:01    Assessment & Plan:  Acquired hypothyroidism- She is euthyroid. -     TSH; Future -     Levothyroxine  Sodium; Take 1 tablet (50 mcg total) by mouth daily.  Dispense: 90 tablet; Refill: 1  Type II diabetes mellitus with manifestations (HCC)- Blood sugar is well controlled. -     Basic metabolic panel with GFR; Future -     Hemoglobin A1c; Future -     HM Diabetes Foot Exam  Long toenail -     Ambulatory referral to Podiatry     Follow-up: Return in about 6 months (around 10/17/2024).  Debby Molt, MD

## 2024-04-17 NOTE — Progress Notes (Signed)
 Subjective:   Jackie Horton is a 89 y.o. who presents for a Medicare Wellness preventive visit.  As a reminder, Annual Wellness Visits don't include a physical exam, and some assessments may be limited, especially if this visit is performed virtually. We may recommend an in-person follow-up visit with your provider if needed.  Visit Complete: In person  Persons Participating in Visit: Patient. with Son, Jackie Horton.    AWV Questionnaire: No: Patient Medicare AWV questionnaire was not completed prior to this visit.  Cardiac Risk Factors include: advanced age (>101men, >33 women);diabetes mellitus;dyslipidemia     Objective:    Today's Vitals   04/17/24 0951  BP: 130/80  Pulse: 96  Weight: 110 lb (49.9 kg)  Height: 5' 3.5 (1.613 m)   Body mass index is 19.18 kg/m.     04/17/2024    9:50 AM 06/27/2021    5:33 PM 06/27/2021    5:32 PM 06/27/2021    5:25 PM 05/10/2020   11:01 AM 12/13/2018   12:44 PM 08/17/2017    3:03 PM  Advanced Directives  Does Patient Have a Medical Advance Directive? No Yes  Yes Yes Yes  Yes   Type of Science writer of [employer] Power of [place];Living will Healthcare Power of [place];Living will  Does patient want to make changes to medical advance directive?  No - Patient declined No - Patient declined No - Patient declined No - Patient declined    Copy of Healthcare Power of Attorney in Chart?  No - copy requested    No - copy requested  No - copy requested   Would patient like information on creating a medical advance directive? Yes (MAU/Ambulatory/Procedural Areas - Information given)           Data saved with a previous flowsheet row definition    Current Medications (verified) Outpatient Encounter Medications as of 04/17/2024  Medication Sig   Ascorbic Acid (VITAMIN C PO) Take 1 tablet by mouth daily.   Cholecalciferol (VITAMIN D  PO) Take 1 tablet by mouth daily.   levothyroxine   (SYNTHROID ) 50 MCG tablet Take 1 tablet (50 mcg total) by mouth daily.   potassium chloride  (KLOR-CON ) 10 MEQ tablet TAKE ONE TABLET DAILY   rosuvastatin  (CRESTOR ) 10 MG tablet TAKE ONE TABLET DAILY   No facility-administered encounter medications on file as of 04/17/2024.    Allergies (verified) Patient has no known allergies.   History: Past Medical History:  Diagnosis Date   Breast cancer (HCC)    Cigarette smoker    Constipation    Hypercholesteremia    Hypertension    Hypothyroid    Osteoarthritis    Past Surgical History:  Procedure Laterality Date   bladder tact  2011   BREAST LUMPECTOMY  2009   PARTIAL HYSTERECTOMY  1972   ROTATOR CUFF REPAIR     Family History  Problem Relation Age of Onset   Aneurysm Father    Breast cancer Sister        3 sisters   Breast cancer Daughter    Social History   Socioeconomic History   Marital status: Single    Spouse name: Not on file   Number of children: 3   Years of education: 11   Highest education level: Not on file  Occupational History   Occupation: caregiver    Employer: RETIRED  Tobacco Use   Smoking status: Some Days    Current packs/day: 0.25  Average packs/day: 0.3 packs/day for 75.5 years (18.9 ttl pk-yrs)    Types: Cigarettes    Start date: 10/26/1948   Smokeless tobacco: Never  Vaping Use   Vaping status: Never Used  Substance and Sexual Activity   Alcohol use: No    Alcohol/week: 0.0 standard drinks of alcohol   Drug use: No   Sexual activity: Not Currently    Birth control/protection: Post-menopausal, Surgical  Other Topics Concern   Not on file  Social History Narrative   2 sisters with breast cancer   Friend--Howard Burch   Social Drivers of Health   Financial Resource Strain: Low Risk  (04/17/2024)   Overall Financial Resource Strain (CARDIA)    Difficulty of Paying Living Expenses: Not hard at all  Food Insecurity: No Food Insecurity (04/17/2024)   Hunger Vital Sign    Worried About  Running Out of Food in the Last Year: Never true    Ran Out of Food in the Last Year: Never true  Transportation Needs: No Transportation Needs (04/17/2024)   PRAPARE - Administrator, Civil Service (Medical): No    Lack of Transportation (Non-Medical): No  Physical Activity: Inactive (04/17/2024)   Exercise Vital Sign    Days of Exercise per Week: 0 days    Minutes of Exercise per Session: 0 min  Stress: No Stress Concern Present (04/17/2024)   [employer] of Occupational Health - Occupational Stress Questionnaire    Feeling of Stress: Not at all  Social Connections: Moderately Isolated (04/17/2024)   Social Connection and Isolation Panel    Frequency of Communication with Friends and Family: More than three times a week    Frequency of Social Gatherings with Friends and Family: Once a week    Attends Religious Services: 1 to 4 times per year    Active Member of [employer] or Organizations: No    Attends Engineer, structural: Never    Marital Status: Divorced    Tobacco Counseling Ready to quit: Yes Counseling given: No    Clinical Intake:  Pre-visit preparation completed: Yes  Pain : No/denies pain     BMI - recorded: 19.18 Nutritional Status: BMI of 19-24  Normal Nutritional Risks: None Diabetes: Yes CBG done?: No CBG resulted in Enter/ Edit results?: No Did pt. bring in CBG monitor from home?: No  Lab Results  Component Value Date   HGBA1C 6.2 12/13/2023   HGBA1C 6.3 06/10/2023   HGBA1C 6.2 12/24/2022     How often do you need to have someone help you when you read instructions, pamphlets, or other written materials from your doctor or pharmacy?: 3 - Sometimes (son helps)  Interpreter Needed?: No  Information entered by :: Jackie Horton, Jackie Horton   Activities of Daily Living     04/17/2024    9:55 AM  In your present state of health, do you have any difficulty performing the following activities:  Hearing? 1  Comment has slight hearing  concerns but declines referral to Audiologist  Vision? 0  Difficulty concentrating or making decisions? 0  Walking or climbing stairs? 1  Comment imbalance  Dressing or bathing? 0  Doing errands, shopping? 1  Comment Son Dance movement psychotherapist and eating ? Y  Comment Son helps sometimes  Using the Toilet? N  In the past six months, have you accidently leaked urine? N  Do you have problems with loss of bowel control? N  Managing your Medications? Y  Comment Son helps sometimes  Managing your Finances? Y  Comment Son helps  Housekeeping or managing your Housekeeping? Y  Comment Son helps    Patient Care Team: Joshua Debby CROME, MD as PCP - General (Internal Medicine)  I have updated your Care Teams any recent Medical Services you may have received from other providers in the past year.     Assessment:   This is a routine wellness examination for [place].  Hearing/Vision screen Hearing Screening - Comments:: Denies a referral to an Audiologist Vision Screening - Comments:: Denies vision concerns   Goals Addressed               This Visit's Progress     Patient Stated (pt-stated)        Patient stated she wants to keep walking due being imbalanced.       Depression Screen     04/17/2024   10:00 AM 12/13/2023    9:54 AM 08/17/2023    8:47 AM 12/24/2022   10:19 AM 03/24/2022    9:45 AM 12/25/2021   10:30 AM 06/27/2021    5:21 PM  PHQ 2/9 Scores  PHQ - 2 Score 0 0 0 0 0 0 0  PHQ- 9 Score 3          Fall Risk     04/17/2024    9:56 AM 12/13/2023    9:54 AM 08/17/2023    8:47 AM 12/24/2022   10:19 AM 06/16/2022    9:36 AM  Fall Risk   Falls in the past year? 1 0 0 0 0  Number falls in past yr: 0 0 0 0 0  Comment 1      Injury with Fall? 0 0 0 0 0  Risk for fall due to : Impaired balance/gait;History of fall(s) Orthopedic patient No Fall Risks No Fall Risks No Fall Risks  Follow up Falls prevention discussed;Falls evaluation completed Falls evaluation completed  Falls evaluation completed Falls evaluation completed Falls evaluation completed      Data saved with a previous flowsheet row definition    MEDICARE RISK AT HOME:  Medicare Risk at Home Any stairs in or around the home?: No If so, are there any without handrails?: No Home free of loose throw rugs in walkways, pet beds, electrical cords, etc?: Yes Adequate lighting in your home to reduce risk of falls?: Yes Life alert?: No Use of a cane, walker or w/c?: Yes (cane/walker) Grab bars in the bathroom?: Yes Shower chair or bench in shower?: Yes Elevated toilet seat or a handicapped toilet?: Yes  TIMED UP AND GO:  Was the test performed?  No  Cognitive Function: 6CIT completed    12/13/2018   12:46 PM 08/17/2017    2:29 PM  MMSE - Mini Mental State Exam  Not completed: Refused   Orientation to time  4   Orientation to Place  5   Registration  3   Attention/ Calculation  3   Recall  2   Language- name 2 objects  2   Language- repeat  1  Language- follow 3 step command  3   Language- read & follow direction  1   Write a sentence  1   Copy design  1   Total score  26      Data saved with a previous flowsheet row definition        04/17/2024    9:57 AM 05/10/2020   11:03 AM  6CIT Screen  What Year? 4 points 0 points  What month? 3 points 0 points  What time? 3 points 3 points  Count back from 20 0 points 0 points  Months in reverse 4 points 0 points  Repeat phrase 10 points 10 points  Total Score 24 points 13 points    Immunizations Immunization History  Administered Date(s) Administered   Fluad Quad(high Dose 65+) 09/12/2019, 11/19/2020, 07/20/2022, 09/23/2022   Fluad Trivalent(High Dose 65+) 07/26/2023   Influenza Split 07/25/2012   Influenza, High Dose Seasonal PF 10/14/2015, 07/06/2016, 08/10/2017, 12/13/2018   Influenza,inj,Quad PF,6+ Mos 07/20/2013, 10/08/2014   Moderna Covid-19 Fall Seasonal Vaccine 32yrs & older 07/26/2023   Moderna Sars-Covid-2  Vaccination 01/30/2020, 02/27/2020   Pneumococcal Conjugate-13 02/21/2014   Pneumococcal Polysaccharide-23 03/16/2012, 08/10/2017, 09/23/2022   Respiratory Syncytial Virus Vaccine,Recomb Aduvanted(Arexvy) 07/20/2022   Tdap 02/21/2014    Screening Tests Health Maintenance  Topic Date Due   DEXA SCAN  Never done   FOOT EXAM  12/24/2023   COVID-19 Vaccine (4 - Moderna risk 2024-25 season) 01/23/2024   DTaP/Tdap/Td (2 - Td or Tdap) 02/22/2024   INFLUENZA VACCINE  05/26/2024   HEMOGLOBIN A1C  06/11/2024   Medicare Annual Wellness (AWV)  04/17/2025   Pneumococcal Vaccine: 50+ Years  Completed   HPV VACCINES  Aged Out   Meningococcal B Vaccine  Aged Out   OPHTHALMOLOGY EXAM  Discontinued   Zoster Vaccines- Shingrix   Discontinued    Health Maintenance  Health Maintenance Due  Topic Date Due   DEXA SCAN  Never done   FOOT EXAM  12/24/2023   COVID-19 Vaccine (4 - Moderna risk 2024-25 season) 01/23/2024   DTaP/Tdap/Td (2 - Td or Tdap) 02/22/2024   Health Maintenance Items Addressed:04/17/2024  Pt declines to quit smoking.  Additional Screening:  Vision Screening: Recommended annual ophthalmology exams for early detection of glaucoma and other disorders of the eye. Would you like a referral to an eye doctor? No    Dental Screening: Recommended annual dental exams for proper oral hygiene  Community Resource Referral / Chronic Care Management: CRR required this visit?  No   CCM required this visit?  No   Plan:    I have personally reviewed and noted the following in the patient's chart:   Medical and social history Use of alcohol, tobacco or illicit drugs  Current medications and supplements including opioid prescriptions. Patient is not currently taking opioid prescriptions. Functional ability and status Nutritional status Physical activity Advanced directives List of other physicians Hospitalizations, surgeries, and ER visits in previous 12 months Vitals Screenings  to include cognitive, depression, and falls Referrals and appointments  In addition, I have reviewed and discussed with patient certain preventive protocols, quality metrics, and best practice recommendations. A written personalized care plan for preventive services as well as general preventive health recommendations were provided to patient.   Jackie CHRISTELLA Horton, Jackie Horton   04/17/2024   After Visit Summary: (In Person-Declined) Patient declined AVS at this time.  Notes: Nothing significant to report at this time.

## 2024-04-17 NOTE — Patient Instructions (Signed)
 Jackie Horton , Thank you for taking time out of your busy schedule to complete your Annual Wellness Visit with me. I enjoyed our conversation and look forward to speaking with you again next year. I, as well as your care team,  appreciate your ongoing commitment to your health goals. Please review the following plan we discussed and let me know if I can assist you in the future. Your Game plan/ To Do List   Follow up Visits: Next Medicare AWV with our clinical staff: 04/09/2025   Have you seen your provider in the last 6 months (3 months if uncontrolled diabetes)? Yes Next Office Visit with your provider: 04/17/2024  Clinician Recommendations:  Aim for 30 minutes of exercise or brisk walking, 6-8 glasses of water, and 5 servings of fruits and vegetables each day. Educated and advised on getting a Tdap (Tetenus) vaccines in 2025 at the local pharmacy.      This is a list of the screening recommended for you and due dates:  Health Maintenance  Topic Date Due   DEXA scan (bone density measurement)  Never done   COVID-19 Vaccine (3 - Moderna risk series) 03/26/2020   Complete foot exam   12/24/2023   DTaP/Tdap/Td vaccine (2 - Td or Tdap) 02/22/2024   Flu Shot  05/26/2024   Hemoglobin A1C  06/11/2024   Medicare Annual Wellness Visit  04/17/2025   Pneumococcal Vaccine for age over 12  Completed   HPV Vaccine  Aged Out   Meningitis B Vaccine  Aged Out   Eye exam for diabetics  Discontinued   Zoster (Shingles) Vaccine  Discontinued    Advanced directives: (Provided) Advance directive discussed with you today. I have provided a copy for you to complete at home and have notarized. Once this is complete, please bring a copy in to our office so we can scan it into your chart.  Advance Care Planning is important because it:  [x]  Makes sure you receive the medical care that is consistent with your values, goals, and preferences  [x]  It provides guidance to your family and loved ones and reduces their  decisional burden about whether or not they are making the right decisions based on your wishes.  Follow the link provided in your after visit summary or read over the paperwork we have mailed to you to help you started getting your Advance Directives in place. If you need assistance in completing these, please reach out to us  so that we can help you!

## 2024-04-18 DIAGNOSIS — L602 Onychogryphosis: Secondary | ICD-10-CM | POA: Insufficient documentation

## 2024-04-18 MED ORDER — LEVOTHYROXINE SODIUM 50 MCG PO TABS: 50.0000 ug | ORAL_TABLET | Freq: Every day | ORAL | 1 refills | Status: DC

## 2024-05-15 ENCOUNTER — Ambulatory Visit: Admitting: Podiatry

## 2024-05-15 DIAGNOSIS — B351 Tinea unguium: Secondary | ICD-10-CM | POA: Diagnosis not present

## 2024-05-15 DIAGNOSIS — E1142 Type 2 diabetes mellitus with diabetic polyneuropathy: Secondary | ICD-10-CM

## 2024-05-15 DIAGNOSIS — M79675 Pain in left toe(s): Secondary | ICD-10-CM

## 2024-05-15 DIAGNOSIS — M79674 Pain in right toe(s): Secondary | ICD-10-CM | POA: Diagnosis not present

## 2024-05-15 NOTE — Progress Notes (Signed)
  Subjective:  Patient ID: Jackie Horton, female    DOB: Jan 10, 1933,   MRN: [id number]  Chief Complaint  Patient presents with   Nail Problem    Pt is here to have a nail trim     89 y.o. female presents for new concern of thickened elongated and painful nails that are difficult to trim. Requesting to have them trimmed today. Relates burning and tingling in their feet. Patient is diabetic and last A1c was  Lab Results  Component Value Date   HGBA1C 6.2 04/17/2024   .   PCP:  Joshua Debby CROME, MD    . Denies any other pedal complaints. Denies n/v/f/c.   Past Medical History:  Diagnosis Date   Breast cancer (HCC)    Cigarette smoker    Constipation    Hypercholesteremia    Hypertension    Hypothyroid    Osteoarthritis     Objective:  Physical Exam: Vascular: DP/PT pulses 2/4 bilateral. CFT <3 seconds. Absent hair growth on digits. Edema noted to bilateral lower extremities. Xerosis noted bilaterally.  Skin. No lacerations or abrasions bilateral feet. Nails 1-5 bilateral  are thickened discolored and elongated with subungual debris.  Musculoskeletal: MMT 5/5 bilateral lower extremities in DF, PF, Inversion and Eversion. Deceased ROM in DF of ankle joint.  Neurological: Sensation intact to light touch. Protective sensation diminished bilateral.    Assessment:   1. Pain due to onychomycosis of toenails of both feet   2. Type 2 diabetes mellitus with peripheral neuropathy (HCC)      Plan:  Patient was evaluated and treated and all questions answered. -Discussed and educated patient on diabetic foot care, especially with  regards to the vascular, neurological and musculoskeletal systems.  -Stressed the importance of good glycemic control and the detriment of not  controlling glucose levels in relation to the foot. -Discussed supportive shoes at all times and checking feet regularly.  -Mechanically debrided all nails 1-5 bilateral using sterile nail nipper and filed with  dremel without incident  -Answered all patient questions -Patient to return  in 3 months for at risk foot care -Patient advised to call the office if any problems or questions arise in the meantime.   [place]berry Failing, DPM

## 2024-05-18 ENCOUNTER — Other Ambulatory Visit: Payer: Self-pay | Admitting: Internal Medicine

## 2024-05-18 DIAGNOSIS — I10 Essential (primary) hypertension: Secondary | ICD-10-CM

## 2024-05-23 ENCOUNTER — Other Ambulatory Visit: Payer: Self-pay | Admitting: Internal Medicine

## 2024-05-23 DIAGNOSIS — I10 Essential (primary) hypertension: Secondary | ICD-10-CM

## 2024-06-30 ENCOUNTER — Other Ambulatory Visit: Payer: Self-pay | Admitting: Internal Medicine

## 2024-06-30 DIAGNOSIS — E039 Hypothyroidism, unspecified: Secondary | ICD-10-CM

## 2024-08-14 ENCOUNTER — Other Ambulatory Visit: Payer: Self-pay | Admitting: Internal Medicine

## 2024-08-14 DIAGNOSIS — E7800 Pure hypercholesterolemia, unspecified: Secondary | ICD-10-CM

## 2024-08-16 ENCOUNTER — Encounter: Payer: Self-pay | Admitting: Podiatry

## 2024-08-16 ENCOUNTER — Ambulatory Visit: Admitting: Podiatry

## 2024-08-16 DIAGNOSIS — M79675 Pain in left toe(s): Secondary | ICD-10-CM

## 2024-08-16 DIAGNOSIS — E1142 Type 2 diabetes mellitus with diabetic polyneuropathy: Secondary | ICD-10-CM

## 2024-08-16 DIAGNOSIS — M79674 Pain in right toe(s): Secondary | ICD-10-CM

## 2024-08-16 DIAGNOSIS — B351 Tinea unguium: Secondary | ICD-10-CM

## 2024-08-16 NOTE — Progress Notes (Signed)
 This patient returns to my office for at risk foot care.  This patient requires this care by a professional since this patient will be at risk due to having type 2 diabetes and CKD. This patient is unable to cut nails himself since the patient cannot reach his nails.These nails are painful walking and wearing shoes.  This patient presents for at risk foot care today.  General Appearance  Alert, conversant and in no acute stress.  Vascular  Dorsalis pedis and posterior tibial  pulses are palpable  bilaterally.  Capillary return is within normal limits  bilaterally. Temperature is within normal limits  bilaterally.  Neurologic  Senn-Weinstein monofilament wire test diminished  bilaterally. Muscle power within normal limits bilaterally.  Nails Thick disfigured discolored nails with subungual debris  from hallux to fifth toes bilaterally. No evidence of bacterial infection or drainage bilaterally.  Orthopedic  No limitations of motion  feet .  No crepitus or effusions noted.  No bony pathology or digital deformities noted.  Skin  normotropic skin with no porokeratosis noted bilaterally.  No signs of infections or ulcers noted.     Onychomycosis  Pain in right toes  Pain in left toes  Consent was obtained for treatment procedures.   Mechanical debridement of nails 1-5  bilaterally performed with a nail nipper.  Filed with dremel without incident.    Return office visit    3 months                  Told patient to return for periodic foot care and evaluation due to potential at risk complications.   Cordella Bold DPM

## 2024-10-16 ENCOUNTER — Encounter: Payer: Self-pay | Admitting: Internal Medicine

## 2024-10-16 ENCOUNTER — Ambulatory Visit: Payer: Self-pay | Admitting: Internal Medicine

## 2024-10-16 ENCOUNTER — Ambulatory Visit (INDEPENDENT_AMBULATORY_CARE_PROVIDER_SITE_OTHER): Admitting: Internal Medicine

## 2024-10-16 VITALS — BP 136/86 | HR 73 | Temp 97.8°F | Resp 16 | Ht 63.5 in | Wt 110.0 lb

## 2024-10-16 DIAGNOSIS — M19011 Primary osteoarthritis, right shoulder: Secondary | ICD-10-CM

## 2024-10-16 DIAGNOSIS — E118 Type 2 diabetes mellitus with unspecified complications: Secondary | ICD-10-CM

## 2024-10-16 DIAGNOSIS — Z23 Encounter for immunization: Secondary | ICD-10-CM | POA: Diagnosis not present

## 2024-10-16 DIAGNOSIS — M19012 Primary osteoarthritis, left shoulder: Secondary | ICD-10-CM

## 2024-10-16 DIAGNOSIS — E039 Hypothyroidism, unspecified: Secondary | ICD-10-CM

## 2024-10-16 DIAGNOSIS — E119 Type 2 diabetes mellitus without complications: Secondary | ICD-10-CM | POA: Diagnosis not present

## 2024-10-16 DIAGNOSIS — E7800 Pure hypercholesterolemia, unspecified: Secondary | ICD-10-CM | POA: Diagnosis not present

## 2024-10-16 LAB — CBC WITH DIFFERENTIAL/PLATELET
Basophils Absolute: 0 K/uL (ref 0.0–0.1)
Basophils Relative: 0.5 % (ref 0.0–3.0)
Eosinophils Absolute: 0.1 K/uL (ref 0.0–0.7)
Eosinophils Relative: 1.3 % (ref 0.0–5.0)
HCT: 47.9 % — ABNORMAL HIGH (ref 36.0–46.0)
Hemoglobin: 15.9 g/dL — ABNORMAL HIGH (ref 12.0–15.0)
Lymphocytes Relative: 41.2 % (ref 12.0–46.0)
Lymphs Abs: 3.4 K/uL (ref 0.7–4.0)
MCHC: 33.3 g/dL (ref 30.0–36.0)
MCV: 93.8 fl (ref 78.0–100.0)
Monocytes Absolute: 0.6 K/uL (ref 0.1–1.0)
Monocytes Relative: 6.9 % (ref 3.0–12.0)
Neutro Abs: 4.1 K/uL (ref 1.4–7.7)
Neutrophils Relative %: 50.1 % (ref 43.0–77.0)
Platelets: 229 K/uL (ref 150.0–400.0)
RBC: 5.1 Mil/uL (ref 3.87–5.11)
RDW: 14.4 % (ref 11.5–15.5)
WBC: 8.2 K/uL (ref 4.0–10.5)

## 2024-10-16 LAB — HEPATIC FUNCTION PANEL
ALT: 35 U/L (ref 3–35)
AST: 27 U/L (ref 5–37)
Albumin: 4.5 g/dL (ref 3.5–5.2)
Alkaline Phosphatase: 92 U/L (ref 39–117)
Bilirubin, Direct: 0.2 mg/dL (ref 0.1–0.3)
Total Bilirubin: 0.8 mg/dL (ref 0.2–1.2)
Total Protein: 7.7 g/dL (ref 6.0–8.3)

## 2024-10-16 LAB — TSH: TSH: 3.19 u[IU]/mL (ref 0.35–5.50)

## 2024-10-16 LAB — BASIC METABOLIC PANEL WITH GFR
BUN: 14 mg/dL (ref 6–23)
CO2: 28 meq/L (ref 19–32)
Calcium: 10.2 mg/dL (ref 8.4–10.5)
Chloride: 106 meq/L (ref 96–112)
Creatinine, Ser: 0.83 mg/dL (ref 0.40–1.20)
GFR: 62.56 mL/min
Glucose, Bld: 85 mg/dL (ref 70–99)
Potassium: 3.8 meq/L (ref 3.5–5.1)
Sodium: 143 meq/L (ref 135–145)

## 2024-10-16 LAB — HEMOGLOBIN A1C: Hgb A1c MFr Bld: 5.9 % (ref 4.6–6.5)

## 2024-10-16 MED ORDER — COVID-19 MRNA VAC-TRIS(PFIZER) 30 MCG/0.3ML IM SUSY: 0.3000 mL | PREFILLED_SYRINGE | Freq: Once | INTRAMUSCULAR | 0 refills | Status: AC

## 2024-10-16 MED ORDER — LEVOTHYROXINE SODIUM 50 MCG PO TABS: 50.0000 ug | ORAL_TABLET | Freq: Every day | ORAL | 1 refills | Status: AC

## 2024-10-16 MED ORDER — ROSUVASTATIN CALCIUM 10 MG PO TABS: 10.0000 mg | ORAL_TABLET | Freq: Every day | ORAL | 1 refills | Status: AC

## 2024-10-16 NOTE — Progress Notes (Signed)
 "  Subjective:  Patient ID: Jackie Horton, female    DOB: 1932-11-08  Age: 89 y.o. MRN: [id number]  CC: Osteoarthritis and Hypothyroidism   HPI SADIE HAZELETT presents for f/up ----  Discussed the use of AI scribe software for clinical note transcription with the patient, who gave verbal consent to proceed.  History of Present Illness Jackie Horton is an 89 year old female who presents for a routine follow-up visit.  She has been more active recently, moving around independently more frequently. Although she is not reliant on a wheelchair, she uses one for convenience from the parking lot. Her weight is estimated to be around 110 pounds, and she maintains a good appetite, particularly enjoying a large dinner, though she consumes less during breakfast and lunch.  She has a history of a shoulder operation many years ago, which did not heal well, resulting in a stiff shoulder. This stiffness makes dressing difficult.  No nausea, vomiting, cough, fever, or chills. She occasionally experiences morning sniffles but no significant respiratory issues. She does not exhibit temperature sensitivity despite living in a house that is not very warm. She maintains a regular bowel movement pattern, with the last one occurring the previous night.     Outpatient Medications Prior to Visit  Medication Sig Dispense Refill   Ascorbic Acid (VITAMIN C PO) Take 1 tablet by mouth daily.     Cholecalciferol (VITAMIN D  PO) Take 1 tablet by mouth daily.     potassium chloride  (KLOR-CON ) 10 MEQ tablet TAKE ONE TABLET DAILY 90 tablet 1   levothyroxine  (SYNTHROID ) 50 MCG tablet TAKE ONE TABLET DAILY 90 tablet 1   rosuvastatin  (CRESTOR ) 10 MG tablet TAKE ONE TABLET DAILY 90 tablet 1   No facility-administered medications prior to visit.    ROS Review of Systems  Constitutional:  Positive for fatigue. Negative for appetite change, chills, diaphoresis and fever.  HENT: Negative.    Eyes: Negative.    Respiratory:  Negative for cough, chest tightness, shortness of breath and wheezing.   Cardiovascular:  Negative for chest pain, palpitations and leg swelling.  Gastrointestinal: Negative.  Negative for abdominal pain, constipation, diarrhea, nausea and vomiting.  Endocrine: Negative.   Genitourinary: Negative.  Negative for difficulty urinating, dysuria and frequency.  Musculoskeletal:  Positive for arthralgias.  Skin: Negative.   Neurological:  Negative for dizziness, weakness and headaches.  Hematological:  Negative for adenopathy. Does not bruise/bleed easily.  Psychiatric/Behavioral: Negative.      Objective:  BP 136/86 (BP Location: Right Arm, Patient Position: Sitting, Cuff Size: Normal)   Pulse 73   Temp 97.8 F (36.6 C) (Oral)   Resp 16   Ht 5' 3.5 (1.613 m)   Wt 110 lb (49.9 kg)   SpO2 95%   BMI 19.18 kg/m   BP Readings from Last 3 Encounters:  10/16/24 136/86  04/17/24 126/88  04/17/24 130/80    Wt Readings from Last 3 Encounters:  10/16/24 110 lb (49.9 kg)  04/17/24 107 lb (48.5 kg)  04/17/24 110 lb (49.9 kg)    Physical Exam Vitals reviewed.  Constitutional:      General: She is not in acute distress.    Appearance: She is underweight. She is ill-appearing. She is not toxic-appearing or diaphoretic.  HENT:     Nose: Nose normal.     Mouth/Throat:     Mouth: Mucous membranes are moist.  Eyes:     General: No scleral icterus.    Conjunctiva/sclera: Conjunctivae normal.  Cardiovascular:  Rate and Rhythm: Normal rate and regular rhythm.     Heart sounds: No murmur heard.    No friction rub. No gallop.  Pulmonary:     Effort: Pulmonary effort is normal.     Breath sounds: Examination of the right-upper field reveals decreased breath sounds. Examination of the left-upper field reveals decreased breath sounds. Examination of the right-middle field reveals decreased breath sounds. Examination of the left-middle field reveals decreased breath sounds.  Examination of the right-lower field reveals decreased breath sounds. Examination of the left-lower field reveals decreased breath sounds. Decreased breath sounds present. No wheezing, rhonchi or rales.  Abdominal:     General: Abdomen is flat.     Palpations: There is no mass.     Tenderness: There is no abdominal tenderness. There is no guarding.     Hernia: No hernia is present.  Musculoskeletal:        General: Normal range of motion.     Cervical back: Neck supple.  Lymphadenopathy:     Cervical: No cervical adenopathy.  Skin:    General: Skin is warm and dry.  Neurological:     General: No focal deficit present.     Mental Status: She is alert.  Psychiatric:        Attention and Perception: She is inattentive.        Mood and Affect: Mood and affect normal.        Speech: Speech is delayed.        Behavior: Behavior normal.        Thought Content: Thought content normal. Thought content is not paranoid or delusional. Thought content does not include homicidal or suicidal ideation.        Cognition and Memory: Cognition is impaired. Memory is impaired. She exhibits impaired recent memory and impaired remote memory.     Lab Results  Component Value Date   WBC 8.2 10/16/2024   HGB 15.9 (H) 10/16/2024   HCT 47.9 (H) 10/16/2024   PLT 229.0 10/16/2024   GLUCOSE 85 10/16/2024   CHOL 135 06/10/2023   TRIG 107.0 06/10/2023   HDL 47.70 06/10/2023   LDLCALC 66 06/10/2023   ALT 35 10/16/2024   AST 27 10/16/2024   NA 143 10/16/2024   K 3.8 10/16/2024   CL 106 10/16/2024   CREATININE 0.83 10/16/2024   BUN 14 10/16/2024   CO2 28 10/16/2024   TSH 3.19 10/16/2024   INR 1.0 05/16/2009   HGBA1C 5.9 10/16/2024   MICROALBUR 10 06/13/2018    MR Brain Wo Contrast Result Date: 12/20/2018 CLINICAL DATA:  Falling over the last week.  Confusion and weakness. EXAM: MRI HEAD WITHOUT CONTRAST TECHNIQUE: Multiplanar, multiecho pulse sequences of the brain and surrounding structures were  obtained without intravenous contrast. COMPARISON:  07/11/2013.  01/13/2012. FINDINGS: Brain: Diffusion imaging does not show any acute or subacute infarction. There chronic small-vessel ischemic changes of the pons. No focal cerebellar insult. Cerebral hemispheres show old small vessel infarctions in the left lateral thalamus and left basal ganglia. There is an old right posterior frontal cortical and subcortical infarction. There are mild chronic small-vessel ischemic changes of the deep white matter. There is some hemosiderin deposition in the region of the old infarctions in the left thalamus and basal ganglia. There could be a developmental venous anomaly in that region. No sign of mass, acute hemorrhage, hydrocephalus or extra-axial collection. Vascular: Major vessels at the base of the brain show flow. Skull and upper cervical spine: Chronic arthropathy  at the C1-2 articulation but without encroachment upon the spinal canal. Sinuses/Orbits: Clear/normal Other: None IMPRESSION: No acute or reversible finding. Chronic small-vessel ischemic changes of the pons and cerebral hemispheric white matter. 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 I wonder if there is a developmental venous anomaly in this region as well. If so, this is obviously lifelong and would not be specifically treatable in any way. Electronically Signed   By: Oneil Officer M.D.   On: 12/20/2018 08:01   Estimated Creatinine Clearance: 36.2 mL/min (by C-G formula based on SCr of 0.83 mg/dL).   Assessment & Plan:   Acquired hypothyroidism- She is euthyroid. -     CBC with Differential/Platelet; Future -     Hepatic function panel; Future -     TSH; Future -     Levothyroxine  Sodium; Take 1 tablet (50 mcg total) by mouth daily.  Dispense: 90 tablet; Refill: 1  Primary osteoarthritis of both shoulders -     CBC with Differential/Platelet; Future  Immunization due -     Flu  vaccine trivalent PF, 6mos and older(Flulaval,Afluria,Fluarix,Fluzone)  Hypercholesteremia- LDL goal achieved. Doing well on the statin  -     Rosuvastatin  Calcium ; Take 1 tablet (10 mg total) by mouth daily.  Dispense: 90 tablet; Refill: 1     Follow-up: Return in about 6 months (around 04/16/2025).  Debby Molt, MD "

## 2024-10-16 NOTE — Patient Instructions (Signed)

## 2024-10-17 LAB — URINALYSIS, ROUTINE W REFLEX MICROSCOPIC
Bilirubin Urine: NEGATIVE
Hgb urine dipstick: NEGATIVE
Ketones, ur: NEGATIVE
Leukocytes,Ua: NEGATIVE
Nitrite: NEGATIVE
Specific Gravity, Urine: 1.025 (ref 1.000–1.030)
Total Protein, Urine: NEGATIVE
Urine Glucose: NEGATIVE
Urobilinogen, UA: 1 (ref 0.0–1.0)
pH: 6 (ref 5.0–8.0)

## 2024-10-18 ENCOUNTER — Ambulatory Visit

## 2024-11-16 ENCOUNTER — Encounter: Payer: Self-pay | Admitting: Podiatry

## 2024-11-16 ENCOUNTER — Ambulatory Visit: Admitting: Podiatry

## 2024-11-16 DIAGNOSIS — M79675 Pain in left toe(s): Secondary | ICD-10-CM

## 2024-11-16 DIAGNOSIS — E1142 Type 2 diabetes mellitus with diabetic polyneuropathy: Secondary | ICD-10-CM | POA: Diagnosis not present

## 2024-11-16 DIAGNOSIS — M79674 Pain in right toe(s): Secondary | ICD-10-CM | POA: Diagnosis not present

## 2024-11-16 DIAGNOSIS — B351 Tinea unguium: Secondary | ICD-10-CM

## 2024-11-16 NOTE — Progress Notes (Signed)
 This patient returns to my office for at risk foot care.  This patient requires this care by a professional since this patient will be at risk due to having type 2 diabetes and CKD. This patient is unable to cut nails himself since the patient cannot reach his nails.These nails are painful walking and wearing shoes.  This patient presents for at risk foot care today.  General Appearance  Alert, conversant and in no acute stress.  Vascular  Dorsalis pedis and posterior tibial  pulses are palpable  bilaterally.  Capillary return is within normal limits  bilaterally. Temperature is within normal limits  bilaterally.  Neurologic  Senn-Weinstein monofilament wire test diminished  bilaterally. Muscle power within normal limits bilaterally.  Nails Thick disfigured discolored nails with subungual debris  from hallux to fifth toes bilaterally. No evidence of bacterial infection or drainage bilaterally.  Orthopedic  No limitations of motion  feet .  No crepitus or effusions noted.  No bony pathology or digital deformities noted.  Skin  normotropic skin with no porokeratosis noted bilaterally.  No signs of infections or ulcers noted.     Onychomycosis  Pain in right toes  Pain in left toes  Consent was obtained for treatment procedures.   Mechanical debridement of nails 1-5  bilaterally performed with a nail nipper.  Filed with dremel without incident.    Return office visit    3 months                  Told patient to return for periodic foot care and evaluation due to potential at risk complications.   Cordella Bold DPM

## 2025-02-14 ENCOUNTER — Ambulatory Visit: Admitting: Podiatry

## 2025-04-16 ENCOUNTER — Ambulatory Visit: Admitting: Internal Medicine

## 2025-04-19 ENCOUNTER — Ambulatory Visit

## 2025-04-19 ENCOUNTER — Encounter: Admitting: Internal Medicine
